# Patient Record
Sex: Female | Born: 1940 | Race: White | Hispanic: No | Marital: Single | Smoking: Current every day smoker
Health system: Southern US, Community
[De-identification: ages and names within clinical notes are randomized; demographics above are authoritative.]

## PROBLEM LIST (undated history)

## (undated) DIAGNOSIS — C801 Malignant (primary) neoplasm, unspecified: Secondary | ICD-10-CM

## (undated) DIAGNOSIS — I2699 Other pulmonary embolism without acute cor pulmonale: Secondary | ICD-10-CM

## (undated) DIAGNOSIS — R011 Cardiac murmur, unspecified: Secondary | ICD-10-CM

## (undated) DIAGNOSIS — S72002A Fracture of unspecified part of neck of left femur, initial encounter for closed fracture: Secondary | ICD-10-CM

## (undated) DIAGNOSIS — M47816 Spondylosis without myelopathy or radiculopathy, lumbar region: Secondary | ICD-10-CM

## (undated) DIAGNOSIS — I639 Cerebral infarction, unspecified: Secondary | ICD-10-CM

## (undated) DIAGNOSIS — R0602 Shortness of breath: Secondary | ICD-10-CM

## (undated) DIAGNOSIS — I10 Essential (primary) hypertension: Secondary | ICD-10-CM

## (undated) DIAGNOSIS — Z9221 Personal history of antineoplastic chemotherapy: Secondary | ICD-10-CM

## (undated) DIAGNOSIS — C3490 Malignant neoplasm of unspecified part of unspecified bronchus or lung: Secondary | ICD-10-CM

## (undated) DIAGNOSIS — Z923 Personal history of irradiation: Secondary | ICD-10-CM

## (undated) DIAGNOSIS — C4490 Unspecified malignant neoplasm of skin, unspecified: Secondary | ICD-10-CM

## (undated) DIAGNOSIS — J449 Chronic obstructive pulmonary disease, unspecified: Secondary | ICD-10-CM

## (undated) DIAGNOSIS — R52 Pain, unspecified: Secondary | ICD-10-CM

## (undated) DIAGNOSIS — D649 Anemia, unspecified: Secondary | ICD-10-CM

## (undated) DIAGNOSIS — M419 Scoliosis, unspecified: Secondary | ICD-10-CM

## (undated) HISTORY — DX: Scoliosis, unspecified: M41.9

## (undated) HISTORY — DX: Shortness of breath: R06.02

## (undated) HISTORY — DX: Pain, unspecified: R52

## (undated) HISTORY — DX: Cardiac murmur, unspecified: R01.1

## (undated) HISTORY — PX: PORTACATH PLACEMENT: SHX2246

## (undated) HISTORY — DX: Fracture of unspecified part of neck of left femur, initial encounter for closed fracture: S72.002A

## (undated) HISTORY — DX: Spondylosis without myelopathy or radiculopathy, lumbar region: M47.816

## (undated) HISTORY — DX: Other pulmonary embolism without acute cor pulmonale: I26.99

## (undated) HISTORY — DX: Malignant neoplasm of unspecified part of unspecified bronchus or lung: C34.90

## (undated) HISTORY — DX: Personal history of irradiation: Z92.3

## (undated) HISTORY — DX: Unspecified malignant neoplasm of skin, unspecified: C44.90

## (undated) HISTORY — DX: Personal history of antineoplastic chemotherapy: Z92.21

## (undated) HISTORY — DX: Chronic obstructive pulmonary disease, unspecified: J44.9

## (undated) HISTORY — DX: Cerebral infarction, unspecified: I63.9

## (undated) HISTORY — PX: HIP SURGERY: SHX245

---

## 1999-02-22 ENCOUNTER — Encounter (INDEPENDENT_AMBULATORY_CARE_PROVIDER_SITE_OTHER): Payer: Self-pay | Admitting: Specialist

## 1999-02-22 ENCOUNTER — Other Ambulatory Visit: Admission: RE | Admit: 1999-02-22 | Discharge: 1999-02-22 | Payer: Self-pay | Admitting: Otolaryngology

## 2001-12-14 ENCOUNTER — Emergency Department (HOSPITAL_COMMUNITY): Admission: EM | Admit: 2001-12-14 | Discharge: 2001-12-14 | Payer: Self-pay | Admitting: Emergency Medicine

## 2001-12-14 ENCOUNTER — Encounter: Payer: Self-pay | Admitting: Emergency Medicine

## 2002-01-21 ENCOUNTER — Encounter: Payer: Self-pay | Admitting: *Deleted

## 2002-01-21 ENCOUNTER — Ambulatory Visit (HOSPITAL_COMMUNITY): Admission: RE | Admit: 2002-01-21 | Discharge: 2002-01-21 | Payer: Self-pay | Admitting: *Deleted

## 2002-01-26 ENCOUNTER — Encounter: Payer: Self-pay | Admitting: *Deleted

## 2002-01-26 ENCOUNTER — Ambulatory Visit (HOSPITAL_COMMUNITY): Admission: RE | Admit: 2002-01-26 | Discharge: 2002-01-26 | Payer: Self-pay | Admitting: *Deleted

## 2002-07-11 ENCOUNTER — Encounter: Payer: Self-pay | Admitting: *Deleted

## 2002-07-11 ENCOUNTER — Ambulatory Visit (HOSPITAL_COMMUNITY): Admission: RE | Admit: 2002-07-11 | Discharge: 2002-07-11 | Payer: Self-pay | Admitting: *Deleted

## 2002-10-26 ENCOUNTER — Ambulatory Visit (HOSPITAL_COMMUNITY): Admission: RE | Admit: 2002-10-26 | Discharge: 2002-10-26 | Payer: Self-pay | Admitting: *Deleted

## 2002-10-26 ENCOUNTER — Encounter: Payer: Self-pay | Admitting: *Deleted

## 2004-03-23 ENCOUNTER — Emergency Department (HOSPITAL_COMMUNITY): Admission: EM | Admit: 2004-03-23 | Discharge: 2004-03-24 | Payer: Self-pay | Admitting: Emergency Medicine

## 2005-05-18 ENCOUNTER — Inpatient Hospital Stay (HOSPITAL_COMMUNITY): Admission: EM | Admit: 2005-05-18 | Discharge: 2005-05-23 | Payer: Self-pay | Admitting: Emergency Medicine

## 2005-05-18 ENCOUNTER — Ambulatory Visit: Payer: Self-pay | Admitting: Pulmonary Disease

## 2005-05-22 ENCOUNTER — Encounter (INDEPENDENT_AMBULATORY_CARE_PROVIDER_SITE_OTHER): Payer: Self-pay | Admitting: *Deleted

## 2005-06-04 ENCOUNTER — Ambulatory Visit: Payer: Self-pay | Admitting: Pulmonary Disease

## 2005-07-07 DIAGNOSIS — Z923 Personal history of irradiation: Secondary | ICD-10-CM

## 2005-07-07 HISTORY — DX: Personal history of irradiation: Z92.3

## 2005-07-30 ENCOUNTER — Ambulatory Visit: Payer: Self-pay | Admitting: Pulmonary Disease

## 2005-08-01 ENCOUNTER — Ambulatory Visit: Payer: Self-pay | Admitting: Cardiology

## 2005-08-26 ENCOUNTER — Ambulatory Visit (HOSPITAL_COMMUNITY): Admission: RE | Admit: 2005-08-26 | Discharge: 2005-08-26 | Payer: Self-pay | Admitting: Pulmonary Disease

## 2005-09-18 ENCOUNTER — Encounter (INDEPENDENT_AMBULATORY_CARE_PROVIDER_SITE_OTHER): Payer: Self-pay | Admitting: *Deleted

## 2005-09-18 ENCOUNTER — Ambulatory Visit (HOSPITAL_COMMUNITY): Admission: RE | Admit: 2005-09-18 | Discharge: 2005-09-18 | Payer: Self-pay | Admitting: Surgery

## 2005-09-18 DIAGNOSIS — C3490 Malignant neoplasm of unspecified part of unspecified bronchus or lung: Secondary | ICD-10-CM

## 2005-09-18 HISTORY — DX: Malignant neoplasm of unspecified part of unspecified bronchus or lung: C34.90

## 2005-09-30 ENCOUNTER — Ambulatory Visit: Payer: Self-pay | Admitting: Internal Medicine

## 2005-10-06 ENCOUNTER — Ambulatory Visit (HOSPITAL_COMMUNITY): Admission: RE | Admit: 2005-10-06 | Discharge: 2005-10-06 | Payer: Self-pay | Admitting: Internal Medicine

## 2005-10-06 ENCOUNTER — Ambulatory Visit: Admission: RE | Admit: 2005-10-06 | Discharge: 2006-01-04 | Payer: Self-pay | Admitting: Radiation Oncology

## 2005-10-08 LAB — COMPREHENSIVE METABOLIC PANEL
AST: 22 U/L (ref 0–37)
Albumin: 4.1 g/dL (ref 3.5–5.2)
Alkaline Phosphatase: 92 U/L (ref 39–117)
Potassium: 4.6 mEq/L (ref 3.5–5.3)
Sodium: 136 mEq/L (ref 135–145)
Total Protein: 7.1 g/dL (ref 6.0–8.3)

## 2005-10-08 LAB — CBC WITH DIFFERENTIAL/PLATELET
EOS%: 2.2 % (ref 0.0–7.0)
Eosinophils Absolute: 0.1 10*3/uL (ref 0.0–0.5)
MCH: 30.1 pg (ref 26.0–34.0)
MCV: 87.9 fL (ref 81.0–101.0)
MONO%: 9.5 % (ref 0.0–13.0)
NEUT#: 3.6 10*3/uL (ref 1.5–6.5)
RBC: 4.88 10*6/uL (ref 3.70–5.32)
RDW: 13.9 % (ref 11.3–14.5)

## 2005-10-22 LAB — CBC WITH DIFFERENTIAL/PLATELET
EOS%: 1.3 % (ref 0.0–7.0)
MCH: 30.4 pg (ref 26.0–34.0)
MCV: 88.1 fL (ref 81.0–101.0)
MONO%: 7.1 % (ref 0.0–13.0)
RBC: 4.88 10*6/uL (ref 3.70–5.32)
RDW: 13.8 % (ref 11.3–14.5)

## 2005-10-22 LAB — COMPREHENSIVE METABOLIC PANEL
AST: 22 U/L (ref 0–37)
Albumin: 4.3 g/dL (ref 3.5–5.2)
Alkaline Phosphatase: 94 U/L (ref 39–117)
BUN: 10 mg/dL (ref 6–23)
Potassium: 5.1 mEq/L (ref 3.5–5.3)
Sodium: 139 mEq/L (ref 135–145)
Total Protein: 7.1 g/dL (ref 6.0–8.3)

## 2005-11-17 ENCOUNTER — Ambulatory Visit: Payer: Self-pay | Admitting: Internal Medicine

## 2006-01-26 ENCOUNTER — Ambulatory Visit: Payer: Self-pay | Admitting: Internal Medicine

## 2006-01-29 LAB — COMPREHENSIVE METABOLIC PANEL
Albumin: 4.2 g/dL (ref 3.5–5.2)
Alkaline Phosphatase: 83 U/L (ref 39–117)
CO2: 29 mEq/L (ref 19–32)
Calcium: 9.9 mg/dL (ref 8.4–10.5)
Chloride: 100 mEq/L (ref 96–112)
Glucose, Bld: 102 mg/dL — ABNORMAL HIGH (ref 70–99)
Potassium: 4.8 mEq/L (ref 3.5–5.3)
Sodium: 137 mEq/L (ref 135–145)
Total Protein: 6.7 g/dL (ref 6.0–8.3)

## 2006-01-29 LAB — CBC WITH DIFFERENTIAL/PLATELET
Basophils Absolute: 0 10*3/uL (ref 0.0–0.1)
Eosinophils Absolute: 0.2 10*3/uL (ref 0.0–0.5)
HGB: 14.4 g/dL (ref 11.6–15.9)
NEUT#: 3.8 10*3/uL (ref 1.5–6.5)
RDW: 13.5 % (ref 11.3–14.5)
lymph#: 1.4 10*3/uL (ref 0.9–3.3)

## 2006-04-10 ENCOUNTER — Ambulatory Visit: Payer: Self-pay | Admitting: Internal Medicine

## 2006-04-28 LAB — COMPREHENSIVE METABOLIC PANEL
Albumin: 4.1 g/dL (ref 3.5–5.2)
Alkaline Phosphatase: 94 U/L (ref 39–117)
BUN: 8 mg/dL (ref 6–23)
Calcium: 9.8 mg/dL (ref 8.4–10.5)
Chloride: 97 mEq/L (ref 96–112)
Creatinine, Ser: 0.71 mg/dL (ref 0.40–1.20)
Glucose, Bld: 98 mg/dL (ref 70–99)
Potassium: 4.4 mEq/L (ref 3.5–5.3)

## 2006-04-28 LAB — CBC WITH DIFFERENTIAL/PLATELET
Basophils Absolute: 0 10*3/uL (ref 0.0–0.1)
Eosinophils Absolute: 0.2 10*3/uL (ref 0.0–0.5)
HCT: 41.3 % (ref 34.8–46.6)
HGB: 14.3 g/dL (ref 11.6–15.9)
MCV: 90.1 fL (ref 81.0–101.0)
MONO%: 6.8 % (ref 0.0–13.0)
NEUT#: 4.2 10*3/uL (ref 1.5–6.5)
NEUT%: 65.4 % (ref 39.6–76.8)
RDW: 13.5 % (ref 11.3–14.5)

## 2006-05-07 LAB — COMPREHENSIVE METABOLIC PANEL
Albumin: 4.3 g/dL (ref 3.5–5.2)
BUN: 10 mg/dL (ref 6–23)
Calcium: 9.6 mg/dL (ref 8.4–10.5)
Chloride: 100 mEq/L (ref 96–112)
Glucose, Bld: 79 mg/dL (ref 70–99)
Potassium: 3.9 mEq/L (ref 3.5–5.3)
Sodium: 135 mEq/L (ref 135–145)
Total Protein: 6.8 g/dL (ref 6.0–8.3)

## 2006-05-07 LAB — CBC WITH DIFFERENTIAL/PLATELET
Basophils Absolute: 0.1 10*3/uL (ref 0.0–0.1)
EOS%: 3.3 % (ref 0.0–7.0)
Eosinophils Absolute: 0.2 10*3/uL (ref 0.0–0.5)
HGB: 13.7 g/dL (ref 11.6–15.9)
MCH: 30.7 pg (ref 26.0–34.0)
MONO%: 8.8 % (ref 0.0–13.0)
NEUT#: 4.7 10*3/uL (ref 1.5–6.5)
RBC: 4.48 10*6/uL (ref 3.70–5.32)
RDW: 11.7 % (ref 11.3–14.5)
lymph#: 1.5 10*3/uL (ref 0.9–3.3)

## 2006-05-14 LAB — CBC WITH DIFFERENTIAL/PLATELET
BASO%: 0.1 % (ref 0.0–2.0)
EOS%: 1.9 % (ref 0.0–7.0)
LYMPH%: 7 % — ABNORMAL LOW (ref 14.0–48.0)
MCH: 30.5 pg (ref 26.0–34.0)
MCHC: 33.8 g/dL (ref 32.0–36.0)
MONO#: 0.5 10*3/uL (ref 0.1–0.9)
MONO%: 2 % (ref 0.0–13.0)
NEUT%: 89 % — ABNORMAL HIGH (ref 39.6–76.8)
Platelets: 221 10*3/uL (ref 145–400)
RBC: 4.41 10*6/uL (ref 3.70–5.32)
WBC: 24.2 10*3/uL — ABNORMAL HIGH (ref 3.9–10.0)

## 2006-05-14 LAB — COMPREHENSIVE METABOLIC PANEL
ALT: 26 U/L (ref 0–35)
AST: 28 U/L (ref 0–37)
Alkaline Phosphatase: 126 U/L — ABNORMAL HIGH (ref 39–117)
CO2: 37 mEq/L — ABNORMAL HIGH (ref 19–32)
Creatinine, Ser: 0.67 mg/dL (ref 0.40–1.20)
Sodium: 134 mEq/L — ABNORMAL LOW (ref 135–145)
Total Bilirubin: 0.4 mg/dL (ref 0.3–1.2)
Total Protein: 6.2 g/dL (ref 6.0–8.3)

## 2006-05-21 LAB — CBC WITH DIFFERENTIAL/PLATELET
BASO%: 0.2 % (ref 0.0–2.0)
Basophils Absolute: 0 10*3/uL (ref 0.0–0.1)
EOS%: 1.7 % (ref 0.0–7.0)
Eosinophils Absolute: 0.3 10*3/uL (ref 0.0–0.5)
HCT: 37.3 % (ref 34.8–46.6)
HGB: 12.9 g/dL (ref 11.6–15.9)
LYMPH%: 10.9 % — ABNORMAL LOW (ref 14.0–48.0)
MCH: 31.1 pg (ref 26.0–34.0)
MCHC: 34.6 g/dL (ref 32.0–36.0)
MCV: 90 fL (ref 81.0–101.0)
MONO#: 0.8 10*3/uL (ref 0.1–0.9)
MONO%: 5.3 % (ref 0.0–13.0)
NEUT#: 12.2 10*3/uL — ABNORMAL HIGH (ref 1.5–6.5)
NEUT%: 81.9 % — ABNORMAL HIGH (ref 39.6–76.8)
Platelets: 297 10*3/uL (ref 145–400)
RBC: 4.15 10*6/uL (ref 3.70–5.32)
RDW: 13.8 % (ref 11.3–14.5)
WBC: 14.9 10*3/uL — ABNORMAL HIGH (ref 3.9–10.0)
lymph#: 1.6 10*3/uL (ref 0.9–3.3)

## 2006-05-21 LAB — COMPREHENSIVE METABOLIC PANEL
ALT: 22 U/L (ref 0–35)
Alkaline Phosphatase: 113 U/L (ref 39–117)
CO2: 30 mEq/L (ref 19–32)
Creatinine, Ser: 0.63 mg/dL (ref 0.40–1.20)
Sodium: 138 mEq/L (ref 135–145)
Total Bilirubin: 0.3 mg/dL (ref 0.3–1.2)
Total Protein: 6.9 g/dL (ref 6.0–8.3)

## 2006-05-27 ENCOUNTER — Ambulatory Visit: Payer: Self-pay | Admitting: Internal Medicine

## 2006-05-27 LAB — CBC WITH DIFFERENTIAL/PLATELET
Eosinophils Absolute: 0.1 10*3/uL (ref 0.0–0.5)
HCT: 36.6 % (ref 34.8–46.6)
LYMPH%: 17.1 % (ref 14.0–48.0)
MCHC: 34.4 g/dL (ref 32.0–36.0)
MCV: 89.7 fL (ref 81.0–101.0)
MONO#: 0.8 10*3/uL (ref 0.1–0.9)
MONO%: 10.7 % (ref 0.0–13.0)
NEUT#: 5.3 10*3/uL (ref 1.5–6.5)
NEUT%: 68.7 % (ref 39.6–76.8)
Platelets: 532 10*3/uL — ABNORMAL HIGH (ref 145–400)
WBC: 7.6 10*3/uL (ref 3.9–10.0)

## 2006-05-27 LAB — COMPREHENSIVE METABOLIC PANEL
Alkaline Phosphatase: 87 U/L (ref 39–117)
BUN: 11 mg/dL (ref 6–23)
CO2: 29 mEq/L (ref 19–32)
Creatinine, Ser: 0.49 mg/dL (ref 0.40–1.20)
Glucose, Bld: 81 mg/dL (ref 70–99)
Total Bilirubin: 0.5 mg/dL (ref 0.3–1.2)

## 2006-06-04 LAB — COMPREHENSIVE METABOLIC PANEL
Albumin: 3.9 g/dL (ref 3.5–5.2)
Alkaline Phosphatase: 130 U/L — ABNORMAL HIGH (ref 39–117)
BUN: 8 mg/dL (ref 6–23)
CO2: 29 mEq/L (ref 19–32)
Chloride: 101 mEq/L (ref 96–112)
Creatinine, Ser: 0.45 mg/dL (ref 0.40–1.20)
Glucose, Bld: 102 mg/dL — ABNORMAL HIGH (ref 70–99)
Sodium: 138 mEq/L (ref 135–145)
Total Bilirubin: 0.3 mg/dL (ref 0.3–1.2)

## 2006-06-04 LAB — CBC WITH DIFFERENTIAL/PLATELET
BASO%: 0.4 % (ref 0.0–2.0)
HCT: 35 % (ref 34.8–46.6)
MCHC: 34.2 g/dL (ref 32.0–36.0)
MONO#: 1.2 10*3/uL — ABNORMAL HIGH (ref 0.1–0.9)
RBC: 3.87 10*6/uL (ref 3.70–5.32)
RDW: 14.3 % (ref 11.3–14.5)
WBC: 18.4 10*3/uL — ABNORMAL HIGH (ref 3.9–10.0)
lymph#: 1.6 10*3/uL (ref 0.9–3.3)

## 2006-06-11 LAB — CBC WITH DIFFERENTIAL/PLATELET
Basophils Absolute: 0 10*3/uL (ref 0.0–0.1)
EOS%: 1.3 % (ref 0.0–7.0)
HCT: 36.5 % (ref 34.8–46.6)
HGB: 12.5 g/dL (ref 11.6–15.9)
MCH: 31 pg (ref 26.0–34.0)
MCHC: 34.2 g/dL (ref 32.0–36.0)
MCV: 90.6 fL (ref 81.0–101.0)
MONO%: 4.7 % (ref 0.0–13.0)
NEUT%: 81.4 % — ABNORMAL HIGH (ref 39.6–76.8)

## 2006-06-11 LAB — COMPREHENSIVE METABOLIC PANEL
Alkaline Phosphatase: 123 U/L — ABNORMAL HIGH (ref 39–117)
Creatinine, Ser: 0.45 mg/dL (ref 0.40–1.20)
Glucose, Bld: 104 mg/dL — ABNORMAL HIGH (ref 70–99)
Sodium: 137 mEq/L (ref 135–145)
Total Bilirubin: 0.4 mg/dL (ref 0.3–1.2)
Total Protein: 6.7 g/dL (ref 6.0–8.3)

## 2006-06-17 ENCOUNTER — Ambulatory Visit (HOSPITAL_COMMUNITY): Admission: RE | Admit: 2006-06-17 | Discharge: 2006-06-17 | Payer: Self-pay | Admitting: Internal Medicine

## 2006-06-17 LAB — COMPREHENSIVE METABOLIC PANEL
Alkaline Phosphatase: 108 U/L (ref 39–117)
BUN: 9 mg/dL (ref 6–23)
CO2: 28 mEq/L (ref 19–32)
Creatinine, Ser: 0.39 mg/dL — ABNORMAL LOW (ref 0.40–1.20)
Glucose, Bld: 146 mg/dL — ABNORMAL HIGH (ref 70–99)
Total Bilirubin: 0.8 mg/dL (ref 0.3–1.2)

## 2006-06-17 LAB — CBC WITH DIFFERENTIAL/PLATELET
Basophils Absolute: 0 10*3/uL (ref 0.0–0.1)
Eosinophils Absolute: 0.1 10*3/uL (ref 0.0–0.5)
HCT: 35.7 % (ref 34.8–46.6)
HGB: 12.1 g/dL (ref 11.6–15.9)
LYMPH%: 11.6 % — ABNORMAL LOW (ref 14.0–48.0)
MCV: 91 fL (ref 81.0–101.0)
MONO%: 8.2 % (ref 0.0–13.0)
NEUT#: 7.4 10*3/uL — ABNORMAL HIGH (ref 1.5–6.5)
NEUT%: 78.7 % — ABNORMAL HIGH (ref 39.6–76.8)
Platelets: 251 10*3/uL (ref 145–400)
RBC: 3.93 10*6/uL (ref 3.70–5.32)

## 2006-07-06 ENCOUNTER — Ambulatory Visit (HOSPITAL_COMMUNITY): Admission: RE | Admit: 2006-07-06 | Discharge: 2006-07-06 | Payer: Self-pay | Admitting: Internal Medicine

## 2006-07-09 LAB — CBC WITH DIFFERENTIAL/PLATELET
Basophils Absolute: 0.1 10*3/uL (ref 0.0–0.1)
EOS%: 1.1 % (ref 0.0–7.0)
HGB: 10.8 g/dL — ABNORMAL LOW (ref 11.6–15.9)
LYMPH%: 18.9 % (ref 14.0–48.0)
MCH: 31.8 pg (ref 26.0–34.0)
MCV: 92.8 fL (ref 81.0–101.0)
MONO%: 7.6 % (ref 0.0–13.0)
NEUT%: 71.7 % (ref 39.6–76.8)
Platelets: 340 10*3/uL (ref 145–400)
RDW: 17.8 % — ABNORMAL HIGH (ref 11.3–14.5)

## 2006-07-09 LAB — COMPREHENSIVE METABOLIC PANEL
BUN: 9 mg/dL (ref 6–23)
CO2: 30 mEq/L (ref 19–32)
Creatinine, Ser: 0.45 mg/dL (ref 0.40–1.20)
Glucose, Bld: 166 mg/dL — ABNORMAL HIGH (ref 70–99)
Total Bilirubin: 0.4 mg/dL (ref 0.3–1.2)
Total Protein: 6.7 g/dL (ref 6.0–8.3)

## 2006-07-14 ENCOUNTER — Ambulatory Visit: Payer: Self-pay | Admitting: Internal Medicine

## 2006-07-17 LAB — CBC WITH DIFFERENTIAL/PLATELET
Eosinophils Absolute: 0.2 10*3/uL (ref 0.0–0.5)
HCT: 34.5 % — ABNORMAL LOW (ref 34.8–46.6)
LYMPH%: 16.1 % (ref 14.0–48.0)
MONO#: 0.4 10*3/uL (ref 0.1–0.9)
NEUT#: 4.1 10*3/uL (ref 1.5–6.5)
Platelets: 363 10*3/uL (ref 145–400)
RBC: 3.76 10*6/uL (ref 3.70–5.32)
WBC: 5.6 10*3/uL (ref 3.9–10.0)

## 2006-07-17 LAB — COMPREHENSIVE METABOLIC PANEL
Albumin: 4 g/dL (ref 3.5–5.2)
CO2: 26 mEq/L (ref 19–32)
Calcium: 9.7 mg/dL (ref 8.4–10.5)
Glucose, Bld: 97 mg/dL (ref 70–99)
Sodium: 136 mEq/L (ref 135–145)
Total Bilirubin: 0.5 mg/dL (ref 0.3–1.2)
Total Protein: 6.8 g/dL (ref 6.0–8.3)

## 2006-07-23 LAB — CBC WITH DIFFERENTIAL/PLATELET
Eosinophils Absolute: 0.3 10*3/uL (ref 0.0–0.5)
MONO#: 0.9 10*3/uL (ref 0.1–0.9)
NEUT#: 11.2 10*3/uL — ABNORMAL HIGH (ref 1.5–6.5)
Platelets: 229 10*3/uL (ref 145–400)
RBC: 3.53 10*6/uL — ABNORMAL LOW (ref 3.70–5.32)
RDW: 17.3 % — ABNORMAL HIGH (ref 11.3–14.5)
WBC: 14.1 10*3/uL — ABNORMAL HIGH (ref 3.9–10.0)
lymph#: 1.5 10*3/uL (ref 0.9–3.3)

## 2006-07-23 LAB — COMPREHENSIVE METABOLIC PANEL
ALT: 25 U/L (ref 0–35)
Albumin: 4 g/dL (ref 3.5–5.2)
CO2: 31 mEq/L (ref 19–32)
Chloride: 100 mEq/L (ref 96–112)
Glucose, Bld: 125 mg/dL — ABNORMAL HIGH (ref 70–99)
Potassium: 4.6 mEq/L (ref 3.5–5.3)
Sodium: 138 mEq/L (ref 135–145)
Total Protein: 6.7 g/dL (ref 6.0–8.3)

## 2006-07-30 LAB — CBC WITH DIFFERENTIAL/PLATELET
BASO%: 0 % (ref 0.0–2.0)
EOS%: 1.6 % (ref 0.0–7.0)
LYMPH%: 10.6 % — ABNORMAL LOW (ref 14.0–48.0)
MCHC: 34.8 g/dL (ref 32.0–36.0)
MCV: 94.6 fL (ref 81.0–101.0)
MONO%: 4.6 % (ref 0.0–13.0)
Platelets: 192 10*3/uL (ref 145–400)
RBC: 3.62 10*6/uL — ABNORMAL LOW (ref 3.70–5.32)
RDW: 17.5 % — ABNORMAL HIGH (ref 11.3–14.5)

## 2006-07-30 LAB — COMPREHENSIVE METABOLIC PANEL
ALT: 18 U/L (ref 0–35)
AST: 19 U/L (ref 0–37)
Alkaline Phosphatase: 133 U/L — ABNORMAL HIGH (ref 39–117)
Potassium: 4.1 mEq/L (ref 3.5–5.3)
Sodium: 138 mEq/L (ref 135–145)
Total Bilirubin: 0.5 mg/dL (ref 0.3–1.2)
Total Protein: 7 g/dL (ref 6.0–8.3)

## 2006-08-06 LAB — COMPREHENSIVE METABOLIC PANEL
Alkaline Phosphatase: 98 U/L (ref 39–117)
BUN: 12 mg/dL (ref 6–23)
Glucose, Bld: 122 mg/dL — ABNORMAL HIGH (ref 70–99)
Sodium: 140 mEq/L (ref 135–145)
Total Bilirubin: 0.5 mg/dL (ref 0.3–1.2)
Total Protein: 6.6 g/dL (ref 6.0–8.3)

## 2006-08-06 LAB — CBC WITH DIFFERENTIAL/PLATELET
EOS%: 1.8 % (ref 0.0–7.0)
Eosinophils Absolute: 0.1 10*3/uL (ref 0.0–0.5)
LYMPH%: 16.5 % (ref 14.0–48.0)
MCH: 32 pg (ref 26.0–34.0)
MCHC: 34.5 g/dL (ref 32.0–36.0)
MCV: 92.9 fL (ref 81.0–101.0)
MONO%: 9.8 % (ref 0.0–13.0)
NEUT#: 4.6 10*3/uL (ref 1.5–6.5)
Platelets: 258 10*3/uL (ref 145–400)
RBC: 3.64 10*6/uL — ABNORMAL LOW (ref 3.70–5.32)

## 2006-08-13 LAB — COMPREHENSIVE METABOLIC PANEL
Alkaline Phosphatase: 126 U/L — ABNORMAL HIGH (ref 39–117)
BUN: 10 mg/dL (ref 6–23)
Creatinine, Ser: 0.49 mg/dL (ref 0.40–1.20)
Glucose, Bld: 144 mg/dL — ABNORMAL HIGH (ref 70–99)
Total Bilirubin: 0.4 mg/dL (ref 0.3–1.2)

## 2006-08-13 LAB — CBC WITH DIFFERENTIAL/PLATELET
Basophils Absolute: 0 10*3/uL (ref 0.0–0.1)
Eosinophils Absolute: 0.2 10*3/uL (ref 0.0–0.5)
HGB: 10.4 g/dL — ABNORMAL LOW (ref 11.6–15.9)
LYMPH%: 13.2 % — ABNORMAL LOW (ref 14.0–48.0)
MCV: 95.3 fL (ref 81.0–101.0)
MONO%: 9.6 % (ref 0.0–13.0)
NEUT#: 8.7 10*3/uL — ABNORMAL HIGH (ref 1.5–6.5)
Platelets: 143 10*3/uL — ABNORMAL LOW (ref 145–400)

## 2006-08-20 LAB — COMPREHENSIVE METABOLIC PANEL
Alkaline Phosphatase: 124 U/L — ABNORMAL HIGH (ref 39–117)
BUN: 10 mg/dL (ref 6–23)
CO2: 26 mEq/L (ref 19–32)
Glucose, Bld: 174 mg/dL — ABNORMAL HIGH (ref 70–99)
Sodium: 131 mEq/L — ABNORMAL LOW (ref 135–145)
Total Bilirubin: 0.6 mg/dL (ref 0.3–1.2)
Total Protein: 6.6 g/dL (ref 6.0–8.3)

## 2006-08-20 LAB — CBC WITH DIFFERENTIAL/PLATELET
Basophils Absolute: 0 10*3/uL (ref 0.0–0.1)
Eosinophils Absolute: 0.1 10*3/uL (ref 0.0–0.5)
HCT: 29.3 % — ABNORMAL LOW (ref 34.8–46.6)
HGB: 10.4 g/dL — ABNORMAL LOW (ref 11.6–15.9)
LYMPH%: 9.9 % — ABNORMAL LOW (ref 14.0–48.0)
MCV: 95.1 fL (ref 81.0–101.0)
MONO#: 0.8 10*3/uL (ref 0.1–0.9)
MONO%: 5.5 % (ref 0.0–13.0)
NEUT#: 12.3 10*3/uL — ABNORMAL HIGH (ref 1.5–6.5)
NEUT%: 83.9 % — ABNORMAL HIGH (ref 39.6–76.8)
Platelets: 201 10*3/uL (ref 145–400)
WBC: 14.7 10*3/uL — ABNORMAL HIGH (ref 3.9–10.0)

## 2006-08-24 ENCOUNTER — Ambulatory Visit: Payer: Self-pay | Admitting: Internal Medicine

## 2006-08-27 LAB — CBC WITH DIFFERENTIAL/PLATELET
Basophils Absolute: 0.1 10*3/uL (ref 0.0–0.1)
Eosinophils Absolute: 0.1 10*3/uL (ref 0.0–0.5)
HGB: 10.8 g/dL — ABNORMAL LOW (ref 11.6–15.9)
MCV: 94.4 fL (ref 81.0–101.0)
MONO#: 0.6 10*3/uL (ref 0.1–0.9)
NEUT#: 5 10*3/uL (ref 1.5–6.5)
Platelets: 239 10*3/uL (ref 145–400)
RBC: 3.2 10*6/uL — ABNORMAL LOW (ref 3.70–5.32)
RDW: 13.5 % (ref 11.3–14.5)
WBC: 6.8 10*3/uL (ref 3.9–10.0)

## 2006-08-27 LAB — COMPREHENSIVE METABOLIC PANEL
Albumin: 3.9 g/dL (ref 3.5–5.2)
BUN: 9 mg/dL (ref 6–23)
CO2: 23 mEq/L (ref 19–32)
Calcium: 9.5 mg/dL (ref 8.4–10.5)
Glucose, Bld: 100 mg/dL — ABNORMAL HIGH (ref 70–99)
Potassium: 4.2 mEq/L (ref 3.5–5.3)
Sodium: 136 mEq/L (ref 135–145)
Total Protein: 7.2 g/dL (ref 6.0–8.3)

## 2006-08-31 ENCOUNTER — Ambulatory Visit (HOSPITAL_COMMUNITY): Admission: RE | Admit: 2006-08-31 | Discharge: 2006-08-31 | Payer: Self-pay | Admitting: Internal Medicine

## 2006-09-17 LAB — COMPREHENSIVE METABOLIC PANEL
ALT: 12 U/L (ref 0–35)
CO2: 27 mEq/L (ref 19–32)
Calcium: 9.1 mg/dL (ref 8.4–10.5)
Chloride: 100 mEq/L (ref 96–112)
Glucose, Bld: 94 mg/dL (ref 70–99)
Sodium: 137 mEq/L (ref 135–145)
Total Bilirubin: 0.5 mg/dL (ref 0.3–1.2)
Total Protein: 6.8 g/dL (ref 6.0–8.3)

## 2006-09-17 LAB — CBC WITH DIFFERENTIAL/PLATELET
Basophils Absolute: 0 10*3/uL (ref 0.0–0.1)
Eosinophils Absolute: 0.1 10*3/uL (ref 0.0–0.5)
HGB: 10.6 g/dL — ABNORMAL LOW (ref 11.6–15.9)
MONO#: 0.6 10*3/uL (ref 0.1–0.9)
MONO%: 9.4 % (ref 0.0–13.0)
NEUT#: 4.3 10*3/uL (ref 1.5–6.5)
RBC: 3.12 10*6/uL — ABNORMAL LOW (ref 3.70–5.32)
RDW: 17.1 % — ABNORMAL HIGH (ref 11.3–14.5)
WBC: 6.3 10*3/uL (ref 3.9–10.0)
lymph#: 1.3 10*3/uL (ref 0.9–3.3)

## 2006-12-15 ENCOUNTER — Ambulatory Visit: Payer: Self-pay | Admitting: Internal Medicine

## 2006-12-17 LAB — CBC WITH DIFFERENTIAL/PLATELET
BASO%: 0.6 % (ref 0.0–2.0)
Eosinophils Absolute: 0.2 10*3/uL (ref 0.0–0.5)
HCT: 38.8 % (ref 34.8–46.6)
LYMPH%: 26.8 % (ref 14.0–48.0)
MCH: 32.6 pg (ref 26.0–34.0)
MCHC: 34.8 g/dL (ref 32.0–36.0)
MONO#: 0.4 10*3/uL (ref 0.1–0.9)
MONO%: 7.5 % (ref 0.0–13.0)
NEUT#: 3.6 10*3/uL (ref 1.5–6.5)
Platelets: 255 10*3/uL (ref 145–400)
RBC: 4.15 10*6/uL (ref 3.70–5.32)
RDW: 12.8 % (ref 11.3–14.5)
WBC: 5.9 10*3/uL (ref 3.9–10.0)
lymph#: 1.6 10*3/uL (ref 0.9–3.3)

## 2006-12-17 LAB — COMPREHENSIVE METABOLIC PANEL
ALT: 11 U/L (ref 0–35)
Albumin: 4.1 g/dL (ref 3.5–5.2)
CO2: 29 mEq/L (ref 19–32)
Calcium: 9.9 mg/dL (ref 8.4–10.5)
Chloride: 102 mEq/L (ref 96–112)
Glucose, Bld: 103 mg/dL — ABNORMAL HIGH (ref 70–99)
Potassium: 4.1 mEq/L (ref 3.5–5.3)
Sodium: 141 mEq/L (ref 135–145)
Total Bilirubin: 0.6 mg/dL (ref 0.3–1.2)
Total Protein: 6.8 g/dL (ref 6.0–8.3)

## 2006-12-21 ENCOUNTER — Ambulatory Visit (HOSPITAL_COMMUNITY): Admission: RE | Admit: 2006-12-21 | Discharge: 2006-12-21 | Payer: Self-pay | Admitting: Internal Medicine

## 2007-02-15 ENCOUNTER — Ambulatory Visit: Payer: Self-pay | Admitting: Internal Medicine

## 2007-02-17 LAB — COMPREHENSIVE METABOLIC PANEL
AST: 15 U/L (ref 0–37)
BUN: 9 mg/dL (ref 6–23)
Calcium: 10.1 mg/dL (ref 8.4–10.5)
Creatinine, Ser: 0.49 mg/dL (ref 0.40–1.20)
Glucose, Bld: 102 mg/dL — ABNORMAL HIGH (ref 70–99)
Potassium: 3.8 mEq/L (ref 3.5–5.3)
Sodium: 136 mEq/L (ref 135–145)

## 2007-02-17 LAB — CBC WITH DIFFERENTIAL/PLATELET
BASO%: 0.6 % (ref 0.0–2.0)
Eosinophils Absolute: 0.2 10*3/uL (ref 0.0–0.5)
LYMPH%: 30.4 % (ref 14.0–48.0)
MCHC: 35.9 g/dL (ref 32.0–36.0)
MONO#: 0.4 10*3/uL (ref 0.1–0.9)
MONO%: 7.9 % (ref 0.0–13.0)
NEUT#: 3 10*3/uL (ref 1.5–6.5)
Platelets: 255 10*3/uL (ref 145–400)
RBC: 4.19 10*6/uL (ref 3.70–5.32)
RDW: 13.5 % (ref 11.3–14.5)
WBC: 5.1 10*3/uL (ref 3.9–10.0)

## 2007-02-19 ENCOUNTER — Ambulatory Visit (HOSPITAL_COMMUNITY): Admission: RE | Admit: 2007-02-19 | Discharge: 2007-02-19 | Payer: Self-pay | Admitting: Internal Medicine

## 2007-06-15 ENCOUNTER — Ambulatory Visit: Payer: Self-pay | Admitting: Internal Medicine

## 2007-06-17 LAB — CBC WITH DIFFERENTIAL/PLATELET
Basophils Absolute: 0 10*3/uL (ref 0.0–0.1)
Eosinophils Absolute: 0.3 10*3/uL (ref 0.0–0.5)
HCT: 38.8 % (ref 34.8–46.6)
HGB: 13.2 g/dL (ref 11.6–15.9)
MCH: 31.3 pg (ref 26.0–34.0)
MONO#: 0.4 10*3/uL (ref 0.1–0.9)
NEUT#: 3.3 10*3/uL (ref 1.5–6.5)
NEUT%: 58.6 % (ref 39.6–76.8)
WBC: 5.6 10*3/uL (ref 3.9–10.0)
lymph#: 1.6 10*3/uL (ref 0.9–3.3)

## 2007-06-17 LAB — COMPREHENSIVE METABOLIC PANEL
ALT: 12 U/L (ref 0–35)
AST: 17 U/L (ref 0–37)
Albumin: 3.9 g/dL (ref 3.5–5.2)
Alkaline Phosphatase: 73 U/L (ref 39–117)
Calcium: 9.8 mg/dL (ref 8.4–10.5)
Chloride: 100 mEq/L (ref 96–112)
Potassium: 4.3 mEq/L (ref 3.5–5.3)
Sodium: 137 mEq/L (ref 135–145)
Total Protein: 6.7 g/dL (ref 6.0–8.3)

## 2007-06-21 ENCOUNTER — Ambulatory Visit (HOSPITAL_COMMUNITY): Admission: RE | Admit: 2007-06-21 | Discharge: 2007-06-21 | Payer: Self-pay | Admitting: Internal Medicine

## 2007-07-12 ENCOUNTER — Ambulatory Visit: Admission: RE | Admit: 2007-07-12 | Discharge: 2007-09-12 | Payer: Self-pay | Admitting: Radiation Oncology

## 2007-07-13 ENCOUNTER — Encounter: Payer: Self-pay | Admitting: Pulmonary Disease

## 2007-08-02 ENCOUNTER — Encounter: Payer: Self-pay | Admitting: Pulmonary Disease

## 2007-08-02 ENCOUNTER — Ambulatory Visit: Payer: Self-pay | Admitting: Internal Medicine

## 2007-08-02 LAB — CBC WITH DIFFERENTIAL/PLATELET
BASO%: 0.5 % (ref 0.0–2.0)
Basophils Absolute: 0 10*3/uL (ref 0.0–0.1)
EOS%: 3.2 % (ref 0.0–7.0)
MCH: 31.2 pg (ref 26.0–34.0)
MCHC: 34.7 g/dL (ref 32.0–36.0)
MCV: 89.9 fL (ref 81.0–101.0)
MONO%: 7.8 % (ref 0.0–13.0)
RBC: 4.6 10*6/uL (ref 3.70–5.32)
RDW: 13.5 % (ref 11.3–14.5)
lymph#: 2 10*3/uL (ref 0.9–3.3)

## 2007-08-02 LAB — COMPREHENSIVE METABOLIC PANEL
ALT: 11 U/L (ref 0–35)
AST: 17 U/L (ref 0–37)
Albumin: 4.3 g/dL (ref 3.5–5.2)
Alkaline Phosphatase: 76 U/L (ref 39–117)
BUN: 12 mg/dL (ref 6–23)
Potassium: 4.2 mEq/L (ref 3.5–5.3)

## 2007-10-25 ENCOUNTER — Ambulatory Visit: Payer: Self-pay | Admitting: Internal Medicine

## 2007-10-27 ENCOUNTER — Ambulatory Visit (HOSPITAL_COMMUNITY): Admission: RE | Admit: 2007-10-27 | Discharge: 2007-10-27 | Payer: Self-pay | Admitting: Internal Medicine

## 2007-10-27 LAB — COMPREHENSIVE METABOLIC PANEL
ALT: 16 U/L (ref 0–35)
AST: 20 U/L (ref 0–37)
Albumin: 3.7 g/dL (ref 3.5–5.2)
Alkaline Phosphatase: 71 U/L (ref 39–117)
BUN: 12 mg/dL (ref 6–23)
Creatinine, Ser: 0.48 mg/dL (ref 0.40–1.20)
Potassium: 4.2 mEq/L (ref 3.5–5.3)

## 2007-10-27 LAB — CBC WITH DIFFERENTIAL/PLATELET
BASO%: 0.7 % (ref 0.0–2.0)
Basophils Absolute: 0 10*3/uL (ref 0.0–0.1)
EOS%: 3.2 % (ref 0.0–7.0)
HGB: 14.4 g/dL (ref 11.6–15.9)
MCH: 31.8 pg (ref 26.0–34.0)
MCHC: 35.3 g/dL (ref 32.0–36.0)
MCV: 89.9 fL (ref 81.0–101.0)
MONO%: 6.9 % (ref 0.0–13.0)
RDW: 13.8 % (ref 11.3–14.5)
lymph#: 1 10*3/uL (ref 0.9–3.3)

## 2008-02-20 ENCOUNTER — Ambulatory Visit: Payer: Self-pay | Admitting: Internal Medicine

## 2008-02-25 ENCOUNTER — Ambulatory Visit (HOSPITAL_COMMUNITY): Admission: RE | Admit: 2008-02-25 | Discharge: 2008-02-25 | Payer: Self-pay | Admitting: Internal Medicine

## 2008-02-25 LAB — CBC WITH DIFFERENTIAL/PLATELET
BASO%: 0.6 % (ref 0.0–2.0)
Basophils Absolute: 0 10*3/uL (ref 0.0–0.1)
EOS%: 3.1 % (ref 0.0–7.0)
HGB: 14 g/dL (ref 11.6–15.9)
MCH: 31.3 pg (ref 26.0–34.0)
MCV: 90.3 fL (ref 81.0–101.0)
MONO%: 8 % (ref 0.0–13.0)
RBC: 4.46 10*6/uL (ref 3.70–5.32)
RDW: 13.8 % (ref 11.3–14.5)
lymph#: 1 10*3/uL (ref 0.9–3.3)

## 2008-02-25 LAB — COMPREHENSIVE METABOLIC PANEL
ALT: 15 U/L (ref 0–35)
AST: 21 U/L (ref 0–37)
Albumin: 3.6 g/dL (ref 3.5–5.2)
Alkaline Phosphatase: 64 U/L (ref 39–117)
BUN: 7 mg/dL (ref 6–23)
Calcium: 9.5 mg/dL (ref 8.4–10.5)
Chloride: 100 mEq/L (ref 96–112)
Potassium: 3.9 mEq/L (ref 3.5–5.3)
Sodium: 135 mEq/L (ref 135–145)
Total Protein: 6.6 g/dL (ref 6.0–8.3)

## 2008-05-30 ENCOUNTER — Ambulatory Visit: Payer: Self-pay | Admitting: Internal Medicine

## 2008-06-26 ENCOUNTER — Ambulatory Visit (HOSPITAL_COMMUNITY): Admission: RE | Admit: 2008-06-26 | Discharge: 2008-06-26 | Payer: Self-pay | Admitting: Internal Medicine

## 2008-06-26 LAB — COMPREHENSIVE METABOLIC PANEL WITH GFR
ALT: 15 U/L (ref 0–35)
AST: 22 U/L (ref 0–37)
Albumin: 3.9 g/dL (ref 3.5–5.2)
Alkaline Phosphatase: 75 U/L (ref 39–117)
BUN: 10 mg/dL (ref 6–23)
CO2: 32 meq/L (ref 19–32)
Calcium: 9.9 mg/dL (ref 8.4–10.5)
Chloride: 96 meq/L (ref 96–112)
Creatinine, Ser: 0.49 mg/dL (ref 0.40–1.20)
Glucose, Bld: 107 mg/dL — ABNORMAL HIGH (ref 70–99)
Potassium: 4.3 meq/L (ref 3.5–5.3)
Sodium: 136 meq/L (ref 135–145)
Total Bilirubin: 1 mg/dL (ref 0.3–1.2)
Total Protein: 7 g/dL (ref 6.0–8.3)

## 2008-06-26 LAB — CBC WITH DIFFERENTIAL/PLATELET
BASO%: 0.6 % (ref 0.0–2.0)
EOS%: 4.5 % (ref 0.0–7.0)
MCH: 31.4 pg (ref 26.0–34.0)
MCV: 90.7 fL (ref 81.0–101.0)
MONO%: 6.9 % (ref 0.0–13.0)
RBC: 4.71 10*6/uL (ref 3.70–5.32)
RDW: 13.5 % (ref 11.3–14.5)

## 2008-12-01 ENCOUNTER — Ambulatory Visit: Payer: Self-pay | Admitting: Internal Medicine

## 2008-12-06 ENCOUNTER — Ambulatory Visit (HOSPITAL_COMMUNITY): Admission: RE | Admit: 2008-12-06 | Discharge: 2008-12-06 | Payer: Self-pay | Admitting: Internal Medicine

## 2008-12-06 LAB — CBC WITH DIFFERENTIAL/PLATELET
EOS%: 2.7 % (ref 0.0–7.0)
Eosinophils Absolute: 0.2 10*3/uL (ref 0.0–0.5)
HGB: 14.4 g/dL (ref 11.6–15.9)
MCV: 89.7 fL (ref 79.5–101.0)
MONO%: 7.8 % (ref 0.0–14.0)
NEUT#: 4.2 10*3/uL (ref 1.5–6.5)
RBC: 4.61 10*6/uL (ref 3.70–5.45)
RDW: 13.8 % (ref 11.2–14.5)
lymph#: 1 10*3/uL (ref 0.9–3.3)

## 2008-12-06 LAB — COMPREHENSIVE METABOLIC PANEL
AST: 19 U/L (ref 0–37)
Albumin: 3.7 g/dL (ref 3.5–5.2)
Alkaline Phosphatase: 73 U/L (ref 39–117)
Calcium: 9.6 mg/dL (ref 8.4–10.5)
Chloride: 94 mEq/L — ABNORMAL LOW (ref 96–112)
Glucose, Bld: 98 mg/dL (ref 70–99)
Potassium: 3.9 mEq/L (ref 3.5–5.3)
Sodium: 134 mEq/L — ABNORMAL LOW (ref 135–145)
Total Protein: 6.7 g/dL (ref 6.0–8.3)

## 2009-06-11 ENCOUNTER — Ambulatory Visit: Payer: Self-pay | Admitting: Internal Medicine

## 2009-06-13 LAB — COMPREHENSIVE METABOLIC PANEL
Albumin: 3.9 g/dL (ref 3.5–5.2)
Alkaline Phosphatase: 82 U/L (ref 39–117)
BUN: 8 mg/dL (ref 6–23)
CO2: 29 mEq/L (ref 19–32)
Calcium: 9.7 mg/dL (ref 8.4–10.5)
Chloride: 95 mEq/L — ABNORMAL LOW (ref 96–112)
Glucose, Bld: 99 mg/dL (ref 70–99)
Potassium: 4.4 mEq/L (ref 3.5–5.3)
Sodium: 133 mEq/L — ABNORMAL LOW (ref 135–145)
Total Protein: 7 g/dL (ref 6.0–8.3)

## 2009-06-13 LAB — CBC WITH DIFFERENTIAL/PLATELET
Basophils Absolute: 0 10*3/uL (ref 0.0–0.1)
Eosinophils Absolute: 0.3 10*3/uL (ref 0.0–0.5)
HGB: 14.9 g/dL (ref 11.6–15.9)
MCV: 92.3 fL (ref 79.5–101.0)
MONO#: 0.5 10*3/uL (ref 0.1–0.9)
NEUT#: 4.6 10*3/uL (ref 1.5–6.5)
RBC: 4.73 10*6/uL (ref 3.70–5.45)
RDW: 13.6 % (ref 11.2–14.5)
WBC: 6.7 10*3/uL (ref 3.9–10.3)
lymph#: 1.3 10*3/uL (ref 0.9–3.3)

## 2009-06-19 ENCOUNTER — Ambulatory Visit (HOSPITAL_COMMUNITY): Admission: RE | Admit: 2009-06-19 | Discharge: 2009-06-19 | Payer: Self-pay | Admitting: Internal Medicine

## 2009-12-12 ENCOUNTER — Ambulatory Visit: Payer: Self-pay | Admitting: Internal Medicine

## 2009-12-14 ENCOUNTER — Ambulatory Visit (HOSPITAL_COMMUNITY): Admission: RE | Admit: 2009-12-14 | Discharge: 2009-12-14 | Payer: Self-pay | Admitting: Internal Medicine

## 2009-12-14 LAB — COMPREHENSIVE METABOLIC PANEL
ALT: 14 U/L (ref 0–35)
AST: 20 U/L (ref 0–37)
Albumin: 3.8 g/dL (ref 3.5–5.2)
Alkaline Phosphatase: 74 U/L (ref 39–117)
Glucose, Bld: 95 mg/dL (ref 70–99)
Potassium: 3.8 mEq/L (ref 3.5–5.3)
Sodium: 135 mEq/L (ref 135–145)
Total Bilirubin: 0.7 mg/dL (ref 0.3–1.2)
Total Protein: 6.8 g/dL (ref 6.0–8.3)

## 2009-12-14 LAB — CBC WITH DIFFERENTIAL/PLATELET
BASO%: 1 % (ref 0.0–2.0)
EOS%: 4.2 % (ref 0.0–7.0)
Eosinophils Absolute: 0.2 10*3/uL (ref 0.0–0.5)
MCHC: 33.4 g/dL (ref 31.5–36.0)
MCV: 90.7 fL (ref 79.5–101.0)
MONO%: 8.6 % (ref 0.0–14.0)
NEUT#: 3.5 10*3/uL (ref 1.5–6.5)
RBC: 4.74 10*6/uL (ref 3.70–5.45)
RDW: 13.5 % (ref 11.2–14.5)

## 2009-12-28 ENCOUNTER — Ambulatory Visit (HOSPITAL_COMMUNITY): Admission: RE | Admit: 2009-12-28 | Discharge: 2009-12-28 | Payer: Self-pay | Admitting: Internal Medicine

## 2010-01-15 ENCOUNTER — Ambulatory Visit: Admission: RE | Admit: 2010-01-15 | Discharge: 2010-03-18 | Payer: Self-pay | Admitting: Radiation Oncology

## 2010-03-26 ENCOUNTER — Ambulatory Visit: Payer: Self-pay | Admitting: Internal Medicine

## 2010-03-29 ENCOUNTER — Ambulatory Visit (HOSPITAL_COMMUNITY): Admission: RE | Admit: 2010-03-29 | Discharge: 2010-03-29 | Payer: Self-pay | Admitting: Internal Medicine

## 2010-03-29 LAB — CBC WITH DIFFERENTIAL/PLATELET
BASO%: 0.3 % (ref 0.0–2.0)
Basophils Absolute: 0 10*3/uL (ref 0.0–0.1)
EOS%: 5 % (ref 0.0–7.0)
Eosinophils Absolute: 0.3 10*3/uL (ref 0.0–0.5)
HCT: 41.8 % (ref 34.8–46.6)
HGB: 14.2 g/dL (ref 11.6–15.9)
LYMPH%: 21.8 % (ref 14.0–49.7)
MCH: 30.6 pg (ref 25.1–34.0)
MCHC: 34 g/dL (ref 31.5–36.0)
MCV: 89.9 fL (ref 79.5–101.0)
MONO#: 0.5 10*3/uL (ref 0.1–0.9)
MONO%: 9.9 % (ref 0.0–14.0)
NEUT#: 3.4 10*3/uL (ref 1.5–6.5)
NEUT%: 63 % (ref 38.4–76.8)
Platelets: 340 10*3/uL (ref 145–400)
RBC: 4.65 10*6/uL (ref 3.70–5.45)
RDW: 13.9 % (ref 11.2–14.5)
WBC: 5.4 10*3/uL (ref 3.9–10.3)
lymph#: 1.2 10*3/uL (ref 0.9–3.3)

## 2010-04-02 LAB — COMPREHENSIVE METABOLIC PANEL
ALT: 13 U/L (ref 0–35)
Albumin: 3.8 g/dL (ref 3.5–5.2)
Alkaline Phosphatase: 76 U/L (ref 39–117)
CO2: 32 mEq/L (ref 19–32)
Glucose, Bld: 100 mg/dL — ABNORMAL HIGH (ref 70–99)
Potassium: 4 mEq/L (ref 3.5–5.3)
Sodium: 134 mEq/L — ABNORMAL LOW (ref 135–145)
Total Protein: 6.9 g/dL (ref 6.0–8.3)

## 2010-04-08 LAB — CBC WITH DIFFERENTIAL/PLATELET
BASO%: 0.6 % (ref 0.0–2.0)
EOS%: 3.6 % (ref 0.0–7.0)
MCH: 30.6 pg (ref 25.1–34.0)
MCHC: 34.3 g/dL (ref 31.5–36.0)
MONO%: 7.4 % (ref 0.0–14.0)
RBC: 4.64 10*6/uL (ref 3.70–5.45)
RDW: 13.8 % (ref 11.2–14.5)
lymph#: 0.9 10*3/uL (ref 0.9–3.3)

## 2010-04-08 LAB — COMPREHENSIVE METABOLIC PANEL
ALT: 11 U/L (ref 0–35)
AST: 16 U/L (ref 0–37)
Albumin: 4.3 g/dL (ref 3.5–5.2)
Alkaline Phosphatase: 75 U/L (ref 39–117)
Calcium: 10.5 mg/dL (ref 8.4–10.5)
Chloride: 98 mEq/L (ref 96–112)
Potassium: 4.9 mEq/L (ref 3.5–5.3)

## 2010-04-15 LAB — CBC WITH DIFFERENTIAL/PLATELET
BASO%: 0.2 % (ref 0.0–2.0)
Basophils Absolute: 0 10*3/uL (ref 0.0–0.1)
EOS%: 2.6 % (ref 0.0–7.0)
HCT: 38.6 % (ref 34.8–46.6)
MCH: 31.3 pg (ref 25.1–34.0)
MCHC: 34.4 g/dL (ref 31.5–36.0)
MCV: 91 fL (ref 79.5–101.0)
MONO%: 1 % (ref 0.0–14.0)
NEUT%: 89 % — ABNORMAL HIGH (ref 38.4–76.8)
RDW: 14 % (ref 11.2–14.5)
lymph#: 1.1 10*3/uL (ref 0.9–3.3)

## 2010-04-15 LAB — COMPREHENSIVE METABOLIC PANEL
ALT: 17 U/L (ref 0–35)
AST: 18 U/L (ref 0–37)
Alkaline Phosphatase: 104 U/L (ref 39–117)
BUN: 9 mg/dL (ref 6–23)
Calcium: 10.3 mg/dL (ref 8.4–10.5)
Chloride: 91 mEq/L — ABNORMAL LOW (ref 96–112)
Creatinine, Ser: 0.45 mg/dL (ref 0.40–1.20)

## 2010-04-22 LAB — COMPREHENSIVE METABOLIC PANEL
ALT: 18 U/L (ref 0–35)
AST: 19 U/L (ref 0–37)
BUN: 8 mg/dL (ref 6–23)
Calcium: 10 mg/dL (ref 8.4–10.5)
Chloride: 97 mEq/L (ref 96–112)
Creatinine, Ser: 0.51 mg/dL (ref 0.40–1.20)
Total Bilirubin: 0.6 mg/dL (ref 0.3–1.2)

## 2010-04-22 LAB — CBC WITH DIFFERENTIAL/PLATELET
BASO%: 0.2 % (ref 0.0–2.0)
Basophils Absolute: 0 10*3/uL (ref 0.0–0.1)
EOS%: 0.8 % (ref 0.0–7.0)
HCT: 35.7 % (ref 34.8–46.6)
HGB: 12.5 g/dL (ref 11.6–15.9)
LYMPH%: 6.4 % — ABNORMAL LOW (ref 14.0–49.7)
MCH: 31.5 pg (ref 25.1–34.0)
MCHC: 35.1 g/dL (ref 31.5–36.0)
MCV: 89.8 fL (ref 79.5–101.0)
MONO%: 4.1 % (ref 0.0–14.0)
NEUT%: 88.5 % — ABNORMAL HIGH (ref 38.4–76.8)
Platelets: 199 10*3/uL (ref 145–400)
lymph#: 1.1 10*3/uL (ref 0.9–3.3)

## 2010-04-25 ENCOUNTER — Ambulatory Visit: Payer: Self-pay | Admitting: Internal Medicine

## 2010-04-29 ENCOUNTER — Encounter: Payer: Self-pay | Admitting: Pulmonary Disease

## 2010-04-29 LAB — CBC WITH DIFFERENTIAL/PLATELET
BASO%: 0.6 % (ref 0.0–2.0)
Basophils Absolute: 0.1 10*3/uL (ref 0.0–0.1)
EOS%: 0.9 % (ref 0.0–7.0)
HGB: 12.5 g/dL (ref 11.6–15.9)
MCH: 30.5 pg (ref 25.1–34.0)
MCHC: 34 g/dL (ref 31.5–36.0)
MCV: 89.8 fL (ref 79.5–101.0)
MONO%: 7.4 % (ref 0.0–14.0)
NEUT%: 83.4 % — ABNORMAL HIGH (ref 38.4–76.8)
RDW: 14.2 % (ref 11.2–14.5)
lymph#: 0.9 10*3/uL (ref 0.9–3.3)

## 2010-04-29 LAB — COMPREHENSIVE METABOLIC PANEL
AST: 16 U/L (ref 0–37)
Alkaline Phosphatase: 95 U/L (ref 39–117)
BUN: 8 mg/dL (ref 6–23)
Glucose, Bld: 95 mg/dL (ref 70–99)
Sodium: 134 mEq/L — ABNORMAL LOW (ref 135–145)
Total Bilirubin: 0.4 mg/dL (ref 0.3–1.2)

## 2010-05-06 LAB — CBC WITH DIFFERENTIAL/PLATELET
Basophils Absolute: 0.1 10*3/uL (ref 0.0–0.1)
Eosinophils Absolute: 0.3 10*3/uL (ref 0.0–0.5)
HGB: 11.8 g/dL (ref 11.6–15.9)
LYMPH%: 14.9 % (ref 14.0–49.7)
MCH: 30.3 pg (ref 25.1–34.0)
MCV: 90.5 fL (ref 79.5–101.0)
MONO%: 15.3 % — ABNORMAL HIGH (ref 0.0–14.0)
NEUT#: 5.8 10*3/uL (ref 1.5–6.5)
NEUT%: 65.7 % (ref 38.4–76.8)
Platelets: 181 10*3/uL (ref 145–400)

## 2010-05-06 LAB — COMPREHENSIVE METABOLIC PANEL
Albumin: 3.9 g/dL (ref 3.5–5.2)
Alkaline Phosphatase: 108 U/L (ref 39–117)
BUN: 10 mg/dL (ref 6–23)
Creatinine, Ser: 0.47 mg/dL (ref 0.40–1.20)
Glucose, Bld: 97 mg/dL (ref 70–99)
Potassium: 4 mEq/L (ref 3.5–5.3)
Total Bilirubin: 0.7 mg/dL (ref 0.3–1.2)

## 2010-05-13 LAB — COMPREHENSIVE METABOLIC PANEL
Albumin: 3.8 g/dL (ref 3.5–5.2)
Alkaline Phosphatase: 101 U/L (ref 39–117)
BUN: 8 mg/dL (ref 6–23)
Calcium: 9.6 mg/dL (ref 8.4–10.5)
Chloride: 97 mEq/L (ref 96–112)
Glucose, Bld: 96 mg/dL (ref 70–99)
Potassium: 4.2 mEq/L (ref 3.5–5.3)

## 2010-05-13 LAB — CBC WITH DIFFERENTIAL/PLATELET
Basophils Absolute: 0 10*3/uL (ref 0.0–0.1)
Eosinophils Absolute: 0.2 10*3/uL (ref 0.0–0.5)
HGB: 11.5 g/dL — ABNORMAL LOW (ref 11.6–15.9)
MCV: 91 fL (ref 79.5–101.0)
MONO%: 5.4 % (ref 0.0–14.0)
NEUT#: 9.9 10*3/uL — ABNORMAL HIGH (ref 1.5–6.5)
RDW: 15 % — ABNORMAL HIGH (ref 11.2–14.5)

## 2010-05-15 ENCOUNTER — Ambulatory Visit (HOSPITAL_COMMUNITY): Admission: RE | Admit: 2010-05-15 | Discharge: 2010-05-15 | Payer: Self-pay | Admitting: Internal Medicine

## 2010-05-20 ENCOUNTER — Encounter: Payer: Self-pay | Admitting: Pulmonary Disease

## 2010-05-20 LAB — COMPREHENSIVE METABOLIC PANEL
Albumin: 3.5 g/dL (ref 3.5–5.2)
BUN: 7 mg/dL (ref 6–23)
Calcium: 9.8 mg/dL (ref 8.4–10.5)
Chloride: 98 mEq/L (ref 96–112)
Glucose, Bld: 95 mg/dL (ref 70–99)
Potassium: 4.3 mEq/L (ref 3.5–5.3)
Total Protein: 6.4 g/dL (ref 6.0–8.3)

## 2010-05-20 LAB — CBC WITH DIFFERENTIAL/PLATELET
Basophils Absolute: 0 10*3/uL (ref 0.0–0.1)
Eosinophils Absolute: 0.1 10*3/uL (ref 0.0–0.5)
HGB: 11.4 g/dL — ABNORMAL LOW (ref 11.6–15.9)
MONO#: 0.5 10*3/uL (ref 0.1–0.9)
NEUT#: 3.7 10*3/uL (ref 1.5–6.5)
RDW: 16.1 % — ABNORMAL HIGH (ref 11.2–14.5)
WBC: 5 10*3/uL (ref 3.9–10.3)
lymph#: 0.7 10*3/uL — ABNORMAL LOW (ref 0.9–3.3)

## 2010-05-27 ENCOUNTER — Ambulatory Visit: Payer: Self-pay | Admitting: Internal Medicine

## 2010-05-27 LAB — CBC WITH DIFFERENTIAL/PLATELET
Basophils Absolute: 0.1 10*3/uL (ref 0.0–0.1)
EOS%: 2.7 % (ref 0.0–7.0)
Eosinophils Absolute: 0.3 10*3/uL (ref 0.0–0.5)
HGB: 10.9 g/dL — ABNORMAL LOW (ref 11.6–15.9)
MCH: 32.4 pg (ref 25.1–34.0)
NEUT#: 8.3 10*3/uL — ABNORMAL HIGH (ref 1.5–6.5)
RBC: 3.37 10*6/uL — ABNORMAL LOW (ref 3.70–5.45)
RDW: 16.6 % — ABNORMAL HIGH (ref 11.2–14.5)
lymph#: 1.2 10*3/uL (ref 0.9–3.3)

## 2010-05-27 LAB — COMPREHENSIVE METABOLIC PANEL
ALT: 21 U/L (ref 0–35)
AST: 22 U/L (ref 0–37)
Albumin: 3.7 g/dL (ref 3.5–5.2)
BUN: 8 mg/dL (ref 6–23)
Calcium: 9.8 mg/dL (ref 8.4–10.5)
Chloride: 93 mEq/L — ABNORMAL LOW (ref 96–112)
Potassium: 3.3 mEq/L — ABNORMAL LOW (ref 3.5–5.3)
Sodium: 133 mEq/L — ABNORMAL LOW (ref 135–145)
Total Protein: 6.4 g/dL (ref 6.0–8.3)

## 2010-06-03 ENCOUNTER — Ambulatory Visit (HOSPITAL_COMMUNITY)
Admission: RE | Admit: 2010-06-03 | Discharge: 2010-06-03 | Payer: Self-pay | Source: Home / Self Care | Admitting: Internal Medicine

## 2010-06-03 LAB — CBC WITH DIFFERENTIAL/PLATELET
BASO%: 0 % (ref 0.0–2.0)
EOS%: 0.8 % (ref 0.0–7.0)
HGB: 11.4 g/dL — ABNORMAL LOW (ref 11.6–15.9)
MCH: 32.6 pg (ref 25.1–34.0)
MCHC: 34.8 g/dL (ref 31.5–36.0)
MCV: 93.5 fL (ref 79.5–101.0)
MONO%: 3.3 % (ref 0.0–14.0)
RBC: 3.5 10*6/uL — ABNORMAL LOW (ref 3.70–5.45)
RDW: 17.5 % — ABNORMAL HIGH (ref 11.2–14.5)
lymph#: 1 10*3/uL (ref 0.9–3.3)

## 2010-06-03 LAB — COMPREHENSIVE METABOLIC PANEL
ALT: 18 U/L (ref 0–35)
AST: 24 U/L (ref 0–37)
Albumin: 3.7 g/dL (ref 3.5–5.2)
Alkaline Phosphatase: 109 U/L (ref 39–117)
Calcium: 9.3 mg/dL (ref 8.4–10.5)
Chloride: 96 mEq/L (ref 96–112)
Potassium: 4.5 mEq/L (ref 3.5–5.3)
Sodium: 136 mEq/L (ref 135–145)

## 2010-06-10 ENCOUNTER — Encounter: Payer: Self-pay | Admitting: Pulmonary Disease

## 2010-06-10 LAB — COMPREHENSIVE METABOLIC PANEL
AST: 16 U/L (ref 0–37)
Albumin: 3.8 g/dL (ref 3.5–5.2)
Alkaline Phosphatase: 92 U/L (ref 39–117)
Glucose, Bld: 95 mg/dL (ref 70–99)
Potassium: 4.9 mEq/L (ref 3.5–5.3)
Sodium: 139 mEq/L (ref 135–145)
Total Bilirubin: 0.6 mg/dL (ref 0.3–1.2)
Total Protein: 6.6 g/dL (ref 6.0–8.3)

## 2010-06-10 LAB — CBC WITH DIFFERENTIAL/PLATELET
EOS%: 1.2 % (ref 0.0–7.0)
LYMPH%: 12 % — ABNORMAL LOW (ref 14.0–49.7)
MCH: 32.6 pg (ref 25.1–34.0)
MCHC: 34.6 g/dL (ref 31.5–36.0)
MCV: 94 fL (ref 79.5–101.0)
MONO%: 9.2 % (ref 0.0–14.0)
Platelets: 271 10*3/uL (ref 145–400)
RBC: 3.66 10*6/uL — ABNORMAL LOW (ref 3.70–5.45)
RDW: 19.2 % — ABNORMAL HIGH (ref 11.2–14.5)

## 2010-06-17 LAB — COMPREHENSIVE METABOLIC PANEL
AST: 24 U/L (ref 0–37)
Alkaline Phosphatase: 110 U/L (ref 39–117)
BUN: 8 mg/dL (ref 6–23)
Creatinine, Ser: 0.38 mg/dL — ABNORMAL LOW (ref 0.40–1.20)
Glucose, Bld: 95 mg/dL (ref 70–99)
Total Bilirubin: 0.6 mg/dL (ref 0.3–1.2)

## 2010-06-17 LAB — CBC WITH DIFFERENTIAL/PLATELET
Basophils Absolute: 0.1 10*3/uL (ref 0.0–0.1)
EOS%: 4.1 % (ref 0.0–7.0)
Eosinophils Absolute: 0.3 10*3/uL (ref 0.0–0.5)
HGB: 10.4 g/dL — ABNORMAL LOW (ref 11.6–15.9)
LYMPH%: 14.1 % (ref 14.0–49.7)
MCH: 33.6 pg (ref 25.1–34.0)
MCV: 95.1 fL (ref 79.5–101.0)
MONO%: 12.7 % (ref 0.0–14.0)
NEUT#: 5.1 10*3/uL (ref 1.5–6.5)
Platelets: 134 10*3/uL — ABNORMAL LOW (ref 145–400)
RDW: 18.4 % — ABNORMAL HIGH (ref 11.2–14.5)

## 2010-06-24 ENCOUNTER — Ambulatory Visit: Payer: Self-pay | Admitting: Internal Medicine

## 2010-06-24 LAB — CBC WITH DIFFERENTIAL/PLATELET
Basophils Absolute: 0 10*3/uL (ref 0.0–0.1)
EOS%: 1.6 % (ref 0.0–7.0)
Eosinophils Absolute: 0.2 10*3/uL (ref 0.0–0.5)
HCT: 33.5 % — ABNORMAL LOW (ref 34.8–46.6)
HGB: 11.1 g/dL — ABNORMAL LOW (ref 11.6–15.9)
LYMPH%: 9.1 % — ABNORMAL LOW (ref 14.0–49.7)
MCH: 32.4 pg (ref 25.1–34.0)
MCV: 97.6 fL (ref 79.5–101.0)
MONO%: 5.7 % (ref 0.0–14.0)
NEUT#: 10 10*3/uL — ABNORMAL HIGH (ref 1.5–6.5)
NEUT%: 83.5 % — ABNORMAL HIGH (ref 38.4–76.8)
Platelets: 194 10*3/uL (ref 145–400)
RDW: 19.6 % — ABNORMAL HIGH (ref 11.2–14.5)

## 2010-06-24 LAB — COMPREHENSIVE METABOLIC PANEL
Albumin: 4.2 g/dL (ref 3.5–5.2)
Alkaline Phosphatase: 115 U/L (ref 39–117)
BUN: 9 mg/dL (ref 6–23)
Creatinine, Ser: 0.74 mg/dL (ref 0.40–1.20)
Glucose, Bld: 124 mg/dL — ABNORMAL HIGH (ref 70–99)
Total Bilirubin: 0.5 mg/dL (ref 0.3–1.2)

## 2010-07-02 ENCOUNTER — Encounter: Payer: Self-pay | Admitting: Pulmonary Disease

## 2010-07-11 ENCOUNTER — Ambulatory Visit: Payer: Self-pay | Admitting: Internal Medicine

## 2010-07-11 LAB — CBC WITH DIFFERENTIAL/PLATELET
BASO%: 0.4 % (ref 0.0–2.0)
Basophils Absolute: 0.1 10*3/uL (ref 0.0–0.1)
EOS%: 1.4 % (ref 0.0–7.0)
Eosinophils Absolute: 0.2 10*3/uL (ref 0.0–0.5)
HCT: 27.9 % — ABNORMAL LOW (ref 34.8–46.6)
HGB: 9.6 g/dL — ABNORMAL LOW (ref 11.6–15.9)
LYMPH%: 8.7 % — ABNORMAL LOW (ref 14.0–49.7)
MCH: 34.3 pg — ABNORMAL HIGH (ref 25.1–34.0)
MCHC: 34.5 g/dL (ref 31.5–36.0)
MCV: 99.2 fL (ref 79.5–101.0)
MONO#: 1.1 10*3/uL — ABNORMAL HIGH (ref 0.1–0.9)
MONO%: 6.9 % (ref 0.0–14.0)
NEUT#: 13.1 10*3/uL — ABNORMAL HIGH (ref 1.5–6.5)
NEUT%: 82.6 % — ABNORMAL HIGH (ref 38.4–76.8)
Platelets: 158 10*3/uL (ref 145–400)
RBC: 2.81 10*6/uL — ABNORMAL LOW (ref 3.70–5.45)
RDW: 18 % — ABNORMAL HIGH (ref 11.2–14.5)
WBC: 15.9 10*3/uL — ABNORMAL HIGH (ref 3.9–10.3)
lymph#: 1.4 10*3/uL (ref 0.9–3.3)

## 2010-07-11 LAB — COMPREHENSIVE METABOLIC PANEL
ALT: 21 U/L (ref 0–35)
AST: 28 U/L (ref 0–37)
Albumin: 4 g/dL (ref 3.5–5.2)
Alkaline Phosphatase: 108 U/L (ref 39–117)
BUN: 5 mg/dL — ABNORMAL LOW (ref 6–23)
CO2: 28 mEq/L (ref 19–32)
Calcium: 9.5 mg/dL (ref 8.4–10.5)
Chloride: 94 mEq/L — ABNORMAL LOW (ref 96–112)
Creatinine, Ser: 0.48 mg/dL (ref 0.40–1.20)
Glucose, Bld: 103 mg/dL — ABNORMAL HIGH (ref 70–99)
Potassium: 3.6 mEq/L (ref 3.5–5.3)
Sodium: 134 mEq/L — ABNORMAL LOW (ref 135–145)
Total Bilirubin: 0.4 mg/dL (ref 0.3–1.2)
Total Protein: 6.4 g/dL (ref 6.0–8.3)

## 2010-07-15 LAB — COMPREHENSIVE METABOLIC PANEL
ALT: 23 U/L (ref 0–35)
AST: 24 U/L (ref 0–37)
Albumin: 4.4 g/dL (ref 3.5–5.2)
Alkaline Phosphatase: 118 U/L — ABNORMAL HIGH (ref 39–117)
BUN: 5 mg/dL — ABNORMAL LOW (ref 6–23)
CO2: 30 mEq/L (ref 19–32)
Calcium: 10.6 mg/dL — ABNORMAL HIGH (ref 8.4–10.5)
Chloride: 96 mEq/L (ref 96–112)
Creatinine, Ser: 0.49 mg/dL (ref 0.40–1.20)
Glucose, Bld: 103 mg/dL — ABNORMAL HIGH (ref 70–99)
Potassium: 4.5 mEq/L (ref 3.5–5.3)
Sodium: 138 mEq/L (ref 135–145)
Total Bilirubin: 0.5 mg/dL (ref 0.3–1.2)
Total Protein: 7.1 g/dL (ref 6.0–8.3)

## 2010-07-15 LAB — CBC WITH DIFFERENTIAL/PLATELET
BASO%: 0.1 % (ref 0.0–2.0)
Basophils Absolute: 0 10*3/uL (ref 0.0–0.1)
EOS%: 0.5 % (ref 0.0–7.0)
Eosinophils Absolute: 0.1 10*3/uL (ref 0.0–0.5)
HCT: 31.7 % — ABNORMAL LOW (ref 34.8–46.6)
HGB: 10.8 g/dL — ABNORMAL LOW (ref 11.6–15.9)
LYMPH%: 7.4 % — ABNORMAL LOW (ref 14.0–49.7)
MCH: 34.3 pg — ABNORMAL HIGH (ref 25.1–34.0)
MCHC: 34 g/dL (ref 31.5–36.0)
MCV: 101 fL (ref 79.5–101.0)
MONO#: 0.6 10*3/uL (ref 0.1–0.9)
MONO%: 3.9 % (ref 0.0–14.0)
NEUT#: 13.9 10*3/uL — ABNORMAL HIGH (ref 1.5–6.5)
NEUT%: 88.1 % — ABNORMAL HIGH (ref 38.4–76.8)
Platelets: 189 10*3/uL (ref 145–400)
RBC: 3.14 10*6/uL — ABNORMAL LOW (ref 3.70–5.45)
RDW: 19.1 % — ABNORMAL HIGH (ref 11.2–14.5)
WBC: 15.8 10*3/uL — ABNORMAL HIGH (ref 3.9–10.3)
lymph#: 1.2 10*3/uL (ref 0.9–3.3)

## 2010-07-22 LAB — COMPREHENSIVE METABOLIC PANEL
ALT: 10 U/L (ref 0–35)
AST: 16 U/L (ref 0–37)
Albumin: 3.9 g/dL (ref 3.5–5.2)
Alkaline Phosphatase: 97 U/L (ref 39–117)
BUN: 10 mg/dL (ref 6–23)
CO2: 34 mEq/L — ABNORMAL HIGH (ref 19–32)
Calcium: 9.8 mg/dL (ref 8.4–10.5)
Chloride: 95 mEq/L — ABNORMAL LOW (ref 96–112)
Creatinine, Ser: 0.37 mg/dL — ABNORMAL LOW (ref 0.40–1.20)
Glucose, Bld: 93 mg/dL (ref 70–99)
Potassium: 4.1 mEq/L (ref 3.5–5.3)
Sodium: 136 mEq/L (ref 135–145)
Total Bilirubin: 0.6 mg/dL (ref 0.3–1.2)
Total Protein: 6.7 g/dL (ref 6.0–8.3)

## 2010-07-22 LAB — CBC WITH DIFFERENTIAL/PLATELET
BASO%: 0.7 % (ref 0.0–2.0)
Basophils Absolute: 0 10*3/uL (ref 0.0–0.1)
EOS%: 0.9 % (ref 0.0–7.0)
Eosinophils Absolute: 0.1 10*3/uL (ref 0.0–0.5)
HCT: 30.4 % — ABNORMAL LOW (ref 34.8–46.6)
HGB: 10.3 g/dL — ABNORMAL LOW (ref 11.6–15.9)
LYMPH%: 12.1 % — ABNORMAL LOW (ref 14.0–49.7)
MCH: 34.5 pg — ABNORMAL HIGH (ref 25.1–34.0)
MCHC: 34 g/dL (ref 31.5–36.0)
MCV: 101.5 fL — ABNORMAL HIGH (ref 79.5–101.0)
MONO#: 0.7 10*3/uL (ref 0.1–0.9)
MONO%: 10.8 % (ref 0.0–14.0)
NEUT#: 5.2 10*3/uL (ref 1.5–6.5)
NEUT%: 75.5 % (ref 38.4–76.8)
Platelets: 257 10*3/uL (ref 145–400)
RBC: 2.99 10*6/uL — ABNORMAL LOW (ref 3.70–5.45)
RDW: 17.8 % — ABNORMAL HIGH (ref 11.2–14.5)
WBC: 6.9 10*3/uL (ref 3.9–10.3)
lymph#: 0.8 10*3/uL — ABNORMAL LOW (ref 0.9–3.3)

## 2010-07-28 ENCOUNTER — Encounter: Payer: Self-pay | Admitting: Internal Medicine

## 2010-07-29 LAB — CBC WITH DIFFERENTIAL/PLATELET
Basophils Absolute: 0.1 10*3/uL (ref 0.0–0.1)
EOS%: 3.7 % (ref 0.0–7.0)
HGB: 9.9 g/dL — ABNORMAL LOW (ref 11.6–15.9)
MCH: 34.5 pg — ABNORMAL HIGH (ref 25.1–34.0)
NEUT#: 5.7 10*3/uL (ref 1.5–6.5)
RDW: 16.1 % — ABNORMAL HIGH (ref 11.2–14.5)
lymph#: 1.1 10*3/uL (ref 0.9–3.3)

## 2010-07-30 LAB — COMPREHENSIVE METABOLIC PANEL
ALT: 12 U/L (ref 0–35)
AST: 16 U/L (ref 0–37)
Albumin: 3.9 g/dL (ref 3.5–5.2)
BUN: 6 mg/dL (ref 6–23)
Calcium: 10.1 mg/dL (ref 8.4–10.5)
Chloride: 96 mEq/L (ref 96–112)
Potassium: 4.3 mEq/L (ref 3.5–5.3)
Sodium: 137 mEq/L (ref 135–145)
Total Protein: 6.4 g/dL (ref 6.0–8.3)

## 2010-08-05 ENCOUNTER — Ambulatory Visit (HOSPITAL_COMMUNITY)
Admission: RE | Admit: 2010-08-05 | Discharge: 2010-08-05 | Payer: Self-pay | Source: Home / Self Care | Attending: Internal Medicine | Admitting: Internal Medicine

## 2010-08-06 NOTE — Letter (Signed)
Summary: [facility]  [facility]   Imported By: Sherian Rein 05/17/2010 09:37:53  _____________________________________________________________________  External Attachment:    Type:   Image     Comment:   External Document

## 2010-08-06 NOTE — Letter (Signed)
Summary: [facility]  [facility]   Imported By: Lennie Odor 05/31/2010 14:05:21  _____________________________________________________________________  External Attachment:    Type:   Image     Comment:   External Document

## 2010-08-08 NOTE — Letter (Signed)
Summary: [facility]  [facility]   Imported By: Lester Hallwood 06/19/2010 07:33:06  _____________________________________________________________________  External Attachment:    Type:   Image     Comment:   External Document

## 2010-08-08 NOTE — Letter (Signed)
Summary: [facility]  [facility]   Imported By: Lester Kwethluk 07/10/2010 09:04:57  _____________________________________________________________________  External Attachment:    Type:   Image     Comment:   External Document

## 2010-08-12 ENCOUNTER — Other Ambulatory Visit: Payer: Self-pay | Admitting: Internal Medicine

## 2010-08-12 ENCOUNTER — Encounter (HOSPITAL_BASED_OUTPATIENT_CLINIC_OR_DEPARTMENT_OTHER): Payer: No Typology Code available for payment source | Admitting: Internal Medicine

## 2010-08-12 DIAGNOSIS — C3410 Malignant neoplasm of upper lobe, unspecified bronchus or lung: Secondary | ICD-10-CM

## 2010-08-12 DIAGNOSIS — C3490 Malignant neoplasm of unspecified part of unspecified bronchus or lung: Secondary | ICD-10-CM

## 2010-08-12 DIAGNOSIS — Z5111 Encounter for antineoplastic chemotherapy: Secondary | ICD-10-CM

## 2010-08-12 DIAGNOSIS — I10 Essential (primary) hypertension: Secondary | ICD-10-CM

## 2010-08-12 LAB — CBC WITH DIFFERENTIAL/PLATELET
BASO%: 0.5 % (ref 0.0–2.0)
EOS%: 1.8 % (ref 0.0–7.0)
MCH: 33.5 pg (ref 25.1–34.0)
MCV: 100.9 fL (ref 79.5–101.0)
MONO%: 9.4 % (ref 0.0–14.0)
NEUT#: 4 10*3/uL (ref 1.5–6.5)
RBC: 3.34 10*6/uL — ABNORMAL LOW (ref 3.70–5.45)
RDW: 15.5 % — ABNORMAL HIGH (ref 11.2–14.5)

## 2010-09-19 LAB — POCT I-STAT, CHEM 8
Calcium, Ion: 1.23 mmol/L (ref 1.12–1.32)
Chloride: 95 mEq/L — ABNORMAL LOW (ref 96–112)
Glucose, Bld: 91 mg/dL (ref 70–99)
HCT: 45 % (ref 36.0–46.0)
TCO2: 33 mmol/L (ref 0–100)

## 2010-11-06 ENCOUNTER — Other Ambulatory Visit: Payer: Self-pay | Admitting: Internal Medicine

## 2010-11-06 ENCOUNTER — Encounter (HOSPITAL_COMMUNITY): Payer: Self-pay

## 2010-11-06 ENCOUNTER — Ambulatory Visit (HOSPITAL_COMMUNITY)
Admission: RE | Admit: 2010-11-06 | Discharge: 2010-11-06 | Disposition: A | Discharge status: Home / Self Care | Payer: No Typology Code available for payment source | Source: Ambulatory Visit | Attending: Internal Medicine | Admitting: Internal Medicine

## 2010-11-06 ENCOUNTER — Encounter (HOSPITAL_BASED_OUTPATIENT_CLINIC_OR_DEPARTMENT_OTHER): Payer: No Typology Code available for payment source | Admitting: Internal Medicine

## 2010-11-06 DIAGNOSIS — C3410 Malignant neoplasm of upper lobe, unspecified bronchus or lung: Secondary | ICD-10-CM

## 2010-11-06 DIAGNOSIS — E279 Disorder of adrenal gland, unspecified: Secondary | ICD-10-CM | POA: Insufficient documentation

## 2010-11-06 DIAGNOSIS — Z5111 Encounter for antineoplastic chemotherapy: Secondary | ICD-10-CM

## 2010-11-06 DIAGNOSIS — J438 Other emphysema: Secondary | ICD-10-CM | POA: Insufficient documentation

## 2010-11-06 DIAGNOSIS — R110 Nausea: Secondary | ICD-10-CM | POA: Insufficient documentation

## 2010-11-06 DIAGNOSIS — Z85118 Personal history of other malignant neoplasm of bronchus and lung: Secondary | ICD-10-CM | POA: Insufficient documentation

## 2010-11-06 DIAGNOSIS — M899 Disorder of bone, unspecified: Secondary | ICD-10-CM | POA: Insufficient documentation

## 2010-11-06 DIAGNOSIS — R079 Chest pain, unspecified: Secondary | ICD-10-CM | POA: Insufficient documentation

## 2010-11-06 DIAGNOSIS — J984 Other disorders of lung: Secondary | ICD-10-CM | POA: Insufficient documentation

## 2010-11-06 DIAGNOSIS — R0602 Shortness of breath: Secondary | ICD-10-CM | POA: Insufficient documentation

## 2010-11-06 DIAGNOSIS — R197 Diarrhea, unspecified: Secondary | ICD-10-CM | POA: Insufficient documentation

## 2010-11-06 DIAGNOSIS — I700 Atherosclerosis of aorta: Secondary | ICD-10-CM | POA: Insufficient documentation

## 2010-11-06 DIAGNOSIS — I77810 Thoracic aortic ectasia: Secondary | ICD-10-CM | POA: Insufficient documentation

## 2010-11-06 DIAGNOSIS — C3490 Malignant neoplasm of unspecified part of unspecified bronchus or lung: Secondary | ICD-10-CM

## 2010-11-06 DIAGNOSIS — R05 Cough: Secondary | ICD-10-CM | POA: Insufficient documentation

## 2010-11-06 DIAGNOSIS — R059 Cough, unspecified: Secondary | ICD-10-CM | POA: Insufficient documentation

## 2010-11-06 DIAGNOSIS — I10 Essential (primary) hypertension: Secondary | ICD-10-CM

## 2010-11-06 HISTORY — DX: Essential (primary) hypertension: I10

## 2010-11-06 LAB — CMP (CANCER CENTER ONLY)
AST: 23 U/L (ref 11–38)
Alkaline Phosphatase: 83 U/L (ref 26–84)
BUN, Bld: 8 mg/dL (ref 7–22)
Glucose, Bld: 103 mg/dL (ref 73–118)
Potassium: 5 mEq/L — ABNORMAL HIGH (ref 3.3–4.7)
Sodium: 140 mEq/L (ref 128–145)
Total Bilirubin: 0.9 mg/dl (ref 0.20–1.60)
Total Protein: 7.1 g/dL (ref 6.4–8.1)

## 2010-11-06 LAB — CBC WITH DIFFERENTIAL/PLATELET
EOS%: 4.3 % (ref 0.0–7.0)
Eosinophils Absolute: 0.2 10*3/uL (ref 0.0–0.5)
LYMPH%: 26.4 % (ref 14.0–49.7)
MCH: 32 pg (ref 25.1–34.0)
MCV: 93.9 fL (ref 79.5–101.0)
MONO%: 8.7 % (ref 0.0–14.0)
NEUT#: 2.4 10*3/uL (ref 1.5–6.5)
Platelets: 226 10*3/uL (ref 145–400)
RBC: 4.23 10*6/uL (ref 3.70–5.45)
RDW: 13.3 % (ref 11.2–14.5)

## 2010-11-06 MED ORDER — IOHEXOL 300 MG/ML  SOLN
100.0000 mL | Freq: Once | INTRAMUSCULAR | Status: AC | PRN
  Administered 2010-11-06: 100 mL via INTRAVENOUS

## 2010-11-11 ENCOUNTER — Encounter (HOSPITAL_BASED_OUTPATIENT_CLINIC_OR_DEPARTMENT_OTHER): Payer: No Typology Code available for payment source | Admitting: Internal Medicine

## 2010-11-11 DIAGNOSIS — C3410 Malignant neoplasm of upper lobe, unspecified bronchus or lung: Secondary | ICD-10-CM

## 2010-11-14 ENCOUNTER — Encounter (HOSPITAL_BASED_OUTPATIENT_CLINIC_OR_DEPARTMENT_OTHER): Payer: No Typology Code available for payment source | Admitting: Internal Medicine

## 2010-11-14 DIAGNOSIS — Z5111 Encounter for antineoplastic chemotherapy: Secondary | ICD-10-CM

## 2010-11-14 DIAGNOSIS — C3410 Malignant neoplasm of upper lobe, unspecified bronchus or lung: Secondary | ICD-10-CM

## 2010-11-22 NOTE — Op Note (Signed)
[name], [name]              ACCOUNT NO.:  [id number]   MEDICAL RECORD NO.:  [id number]          PATIENT TYPE:  INP   LOCATION:  1826                         FACILITY:  [facility]   PHYSICIAN:  Vania Rea. Supple, M.D.  DATE OF BIRTH:  1941-05-23   DATE OF PROCEDURE:  05/18/2005  DATE OF DISCHARGE:                                 OPERATIVE REPORT   PREOPERATIVE DIAGNOSIS:  Left intertrochanteric hip fracture.   POSTOPERATIVE DIAGNOSIS:  Left intertrochanteric hip fracture.   OPERATION PERFORMED:  Open reduction internal fixation of left  intertrochanteric hip fracture utilizing a gamma nail, intramedullary hip  screw, 130 degree neck angle, 90 mm lag screw and static lock distally.   SURGEON:  Vania Rea. Supple, M.D.   Threasa HeadsFrench Ana A. Shuford, P.A.-C.   ANESTHESIA:  General endotracheal.   ESTIMATED BLOOD LOSS:  Minimal.   DRAINS:  None.   INDICATIONS FOR PROCEDURE:  Ms. Grillot is a 70 year old female who fell at  home earlier today injuring her left hip with complaints of pain and  inability to bear weight.  On presentation to the emergency room, she was  found to have a foreshortened and externally rotated left lower extremity.  Severe pain with attempts at left hip motion.  Radiographs confirmed a  minimally displaced two part left intertrochanteric hip fracture.  She is  subsequently brought to the operating room at this time for planned left hip  open reduction internal fixation as described below.   I preoperatively discussed with Ms. Spaid treatment options as well as the  risks versus benefits thereof.  Possible surgical complications of bleeding,  infection, neurovascular injury, malunion, nonunion, loss of fixation and  possible need for additional surgery were reviewed.  She understands and  accept and agrees to our planned procedure.  Additionally, on the  preoperative chest x-ray there were noted to be two nodules on the right  lung field with recommended  follow-up with a CT scan.  I discussed this with  Ms. Junio.  I will plan to obtain the CT scan postoperatively.   DESCRIPTION OF PROCEDURE:  After undergoing routine preoperative evaluation,  the patient received prophylactic antibiotics and brought to the operating  room supine on the hospital bed, underwent smooth induction of general  endotracheal anesthesia.  Transferred to the fracture table with the left  leg placed into gentle traction.  The right leg placed in the well leg  holder.  Appropriately padded and protected throughout.  Fluoroscopic images  were then obtained showing good alignment of the left hip in the AP and  lateral views.  The left hip girdle region was then sterilely prepped and  draped in standard fashion.  A 3 cm stab wound incision was then made three  fingerbreadths proximal to the tip of the greater trochanter. Sharp  dissection carried down through skin and subcutaneous and deep fascia.  The  starting awl was then directed to the tip of the greater trochanter and  gained access to the proximal femur, proper positioning confirmed  fluoroscopically.  A ball tip guidewire was then directed down into the  femur and this was then overdrilled with the standard reamer.  Over this we  then placed the gamma nail and seated it to the appropriate depth.  The  guidewire was then removed.  Using the outrigger arm, a threaded tip guide  pin was then directed up into the femoral neck and head through a separate  stab wound.  This was then overdrilled with a reamer and then a 90 mm lag  screw was placed to the appropriate depth, proper position confirmed on both  AP and lateral fluoroscopic images.  Using the outrigger guide we then  placed a static distal locking screw through the gamma nail under  fluoroscopic guidance.  Final radiographs showed good alignment at the  fracture site and good position of the hardware.  The outrigger was removed.  All wounds were copiously  irrigated, hemostasis was obtained.  The wound was  closed with 2-0 Vicryl subcu and intracuticular 3-0 Monocryl for the skin  followed by Steri-Strips.  A bulky dry dressing was then taped over the left  hip.  The patient was then taken out of traction, extubated and transferred  to her hospital bed and taken to the recovery room in stable condition.      Vania Rea. Supple, M.D.  Electronically Signed     KMS/MEDQ  D:  05/18/2005  T:  05/19/2005  Job:  [id number]

## 2010-11-22 NOTE — Discharge Summary (Signed)
NAMEGRICEL, COPEN              ACCOUNT NO.:  [id number]   MEDICAL RECORD NO.:  [id number]          PATIENT TYPE:  INP   LOCATION:  5729                         FACILITY:  [facility]   PHYSICIAN:  Vania Rea. Supple, M.D.  DATE OF BIRTH:  Aug 11, 1940   DATE OF ADMISSION:  05/18/2005  DATE OF DISCHARGE:  05/23/2005                                 DISCHARGE SUMMARY   ADMISSION DIAGNOSES:  1.  Left intertrochanteric hip fracture.  2.  Hypertension.  3.  Chronic obstructive pulmonary disease.   DISCHARGE DIAGNOSES:  1.  Left intertrochanteric hip fracture.  2.  Hypertension.  3.  Chronic obstructive pulmonary disease.  4.  Status post open reduction and internal fixation of the left hip.  5.  Postoperative hypoxemia with underlying chronic obstructive pulmonary      disease and CT scan confirming right upper lobe nodule.   OPERATIONS:  Open reduction and internal fixation of the left hip.   SURGEON:  Vania Rea. Supple, M.D.   Threasa HeadsFrench Ana A. Shuford, P.A.-C.   ANESTHESIA:  General anesthetic.   BRIEF HISTORY:  Ms. [name] is a 70 year old female who fell and has had the  significant onset of left hip pain with inability to bear weight.  She was  brought to the [facility] emergency room where x-rays confirmed a left  intertrochanteric hip fracture.  Chest x-ray showed two right lung nodules  and further workup was indicated.  However, this was felt to be stable to  tolerate surgical fixation.  The risks and benefits were discussed with the  patient in regard to the nature of her fracture and operation was  recommended.  She was in agreement and wished to proceed.   HOSPITAL COURSE:  The patient was admitted and underwent the above-named  procedure and tolerated this well.  All appropriate intravenous antibiotics  and analgesics were utilized.  Postoperatively, she did have some hypoxemia  and medical consult with Dr. Craige Cotta was obtained.  He saw, evaluated, and  cared for her  postoperatively.  By date 05/23/2005, she had stabilized but  was still requiring 2 L of oxygen and was felt to be stable for discharge on  home oxygen.  She had had a 2-D echocardiogram due to a significant murmur  also.  Outpatient followup was recommended in regard to pulmonary function  as well as CT findings of her chest.  Orthopedically, on 05/23/2005, she was  neurovascularly intact.  She was tolerating ambulation and weight-bearing as  tolerated with a walker.  Dr. Craige Cotta had seen and evaluated her and made final  recommendations on outpatient medicines.  At this time, she was medically  orthopedically stable for discharge to home.   LABORATORY DATA:  Admission hemoglobin 14, at discharge 13.1 and stable.  Blood gases noted on 05/18/2005 at 7.36, pCO2 60, bicarbonate 34.  Postoperatively, pH of 7.46, pCO2 48, pO2 51.  On Coumadin for DVT and PE  prophylaxis and multiple coag's are in the chart.  See the chart for  details.  She had a mild hyperkalemia which was 3.4 at the time of  discharge.   RADIOLOGIC DATA:  X-rays on 05/18/2005 showed two right lung nodules with  interval development since 9/05.  CT was recommended.  Left hip showed  intertrochanteric fracture and postoperatively showed anatomic alignment.  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  Bilateral mild  atelectasis noted.  Chest x-ray on 05/21/2005 showed small bilateral pleural  effusions and a repeat on 05/23/2005, showed a persistent left retrocardiac  consolidation and atelectasis, and again, right upper lobe nodules  demonstrated.  Echocardiogram not in this section at the time of dictation.   CONDITION ON DISCHARGE:  Stable and improved.   DISCHARGE PLAN:  The patient will be discharged to home to resume her home  medicines.  She is recommended for Spiriva and Xopenex medications.  Financial aid was attempted and if not  available, she was placed on Atrovent  and Ventolin inhalers.   RECOMMENDED FOLLOWUP:  Dr. Craige Cotta in 1-2 weeks.  Home oxygen was arranged.  Home health PT and OT were arranged.  Weightbearing as tolerated to the  upper extremity.  She is on Coumadin for DVT and PE prophylaxis for a total  of three weeks.  Pain medications and a prescription for Percocet was  provided.  Home diet.  Follow up in our office in two weeks; call for a  time.  May shower at this time.      Tracy A. Shuford, P.A.-C.      Vania Rea. Supple, M.D.  Electronically Signed    TAS/MEDQ  D:  07/20/2005  T:  07/21/2005  Job:  [id number]

## 2010-11-22 NOTE — Consult Note (Signed)
NAME:  [name], [name]              ACCOUNT NO.:  [id number]   MEDICAL RECORD NO.:  [id number]          PATIENT TYPE:  INP   LOCATION:  5729                         FACILITY:  [facility]   PHYSICIAN:  Coralyn Helling, M.D.      DATE OF BIRTH:  [birth year]   DATE OF CONSULTATION:  05/22/2005  DATE OF DISCHARGE:                                   CONSULTATION   REASON FOR CONSULTATION:  Hypoxia, atelectasis, pleural effusion, and right  upper lung nodule.   HISTORY:  This is a 70 year old female who was admitted on May 18, 2005, after falling and fracturing her left hip.  She is status post ORIF  for this.  She has a 50-pack-year history of smoking and currently smokes  one pack cigarettes per day.  She has baseline dyspnea on exertion, she is  able to walk approximately one block before getting short of breath.  She  also has frequent coughing, more so in the morning.  She says that the  phlegm is usually clear but sometimes can be brownish in color and  occasionally has blood streaking in it, as well.  She has additionally lost  30 pounds over the last 1 to 1 1/2 years.  She had a CAT scan done and chest  x-ray done in 2004 which showed a right upper lobe nodule.  She says that  while she was originally scheduled to have biopsy of this, she was later  told that this was just pneumonia, and it does not appear that any tissue  diagnosis was made of this at that time.  I am not sure if she has had any  follow up for this since then.  During this hospital admission, she was  noted to have oxygen desaturations while ambulating in the hall to as low as  82% with an increase in her heart rate.  A chest x-ray done during this  admission showed the right upper lobe nodule, again, which seemed to be more  prominent in nature and a CT scan of the chest was ordered and this did  verify the presence of the right upper lobe nodule which did appear to have  increased in size.  The CT scan of the chest  also revealed that she has  atelectasis of the left lower lung with pleural effusion and elevation of  her left hemidiaphragm.  She says that when she is at rest, her breathing is  comfortable although she is currently on supplemental oxygen at this time.  She has not had any real significant change in her cough or sputum  production and she denies headaches, dizziness, blurred vision, difficulty  swallowing, or changes in her voice.  She also denies chest pain.  She says  she does occasionally hear herself wheezing.   PAST MEDICAL HISTORY:  Significant for hypertension and she has had hip  surgery.   ALLERGIES:  No known drug allergies.   CURRENT MEDICATIONS:  Catapres 0.1 mg daily, Colace 100 mg b.i.d., Coumadin,  hydrochlorothiazide 50 mg daily, Ventolin inhaler q.i.d., and p.r.n.  Percocet.   SOCIAL HISTORY:  She smokes one pack per cigarettes per day, has a 50-pack-  year history of smoking, she has occasional alcohol use.  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   FAMILY HISTORY:  Significant for cancer, diabetes, heart disease, and  hypertension.   PHYSICAL EXAMINATION:  GENERAL:  She is seen in her hospital bed.  She is awake, alert and  oriented, in no acute distress.  VITAL SIGNS:  Blood pressure 129/69, heart rate 100 and regular, respiratory  rate 22, temperature 98.7, oxygen saturation is 91% on 3 liters nasal  cannula.  HEENT:  Pupils equal and reactive.  Extraocular movements intact.  There is  no sinus tenderness, no nasal discharge.  There is no oral lesion, no  lymphadenopathy, no thyromegaly.  HEART:  S1 and S2 with a 2/6 blowing holosystolic murmur.  CHEST:  She has decreased breath sounds, there are rales heard at the left  base, there is a prolonged expiratory phase.  ABDOMEN:  Soft, nontender, positive bowel sounds.  EXTREMITIES:  No cyanosis, clubbing, and edema.  NEUROLOGICAL:  She has 5/5 strength in the upper and lower extremities, no   cerebellar deficits were noted.   LABORATORY DATA:  WBC 9.3, hemoglobin 16.1, hematocrit 47.6, platelets 278.  Sodium 135, potassium 3.4, chloride 92, CO2 35, BUN 1, creatinine 0.5,  glucose 112.  PT 23.3, INR 2.1.  Calcium 9.2.  CT scan of the chest and  chest x-ray results are as stated above.   IMPRESSION:  She has hypoxia with ambulation.  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  I will add Atrovent nebulizer to  her Ventolin nebulizer treatment and give this to her in addition to using a  flutter valve for her.  Continue her on her incentive spirometry.  I would  like to follow up with a repeat chest x-ray for tomorrow morning and check  an arterial blood gas analysis on room air for tomorrow, as well.  I have  advised her with regards to the importance of smoking cessation.  With  regards to her right upper lobe nodule, again, I do not believe that this  has ever been actually confirmed to be a malignancy, although it certainly  has the appearance of this based on a CAT scan, in addition to her  significant smoking history.  For this, I think tissue confirmation would be  warranted but given the fact that she is on Coumadin and her INR is 2.1, we  would need to make adjustments in her anticoagulation regimen prior to her  undergoing biopsy.  However, this can be arranged as an outpatient.  She  should also have pulmonary function testing to evaluate the degree of her  air flow obstruction which can also be arranged as an outpatient.  Also of  note is that she has a holosystolic blowing murmur on cardiac exam.  To  further evaluate this, I would like her to have a 2D echocardiogram and  determine if any further interventions will be needed based on review of  that.  Otherwise, I feel that it would be warranted to keep her in the hospital at least for another 24 hours and then reassess her  clinical  status.  Then, I would be happy to take her onto my service for the  remainder of her hospitalization as it appears most of her remaining issues  are pulmonary in  nature.  But, we will follow along with you.      Coralyn Helling, M.D.  Electronically Signed     VS/MEDQ  D:  05/22/2005  T:  05/22/2005  Job:  [id number]   cc:   Vania Rea. Supple, M.D.  Fax: [phone]

## 2010-12-05 ENCOUNTER — Other Ambulatory Visit: Payer: Self-pay | Admitting: Internal Medicine

## 2010-12-05 ENCOUNTER — Encounter (HOSPITAL_BASED_OUTPATIENT_CLINIC_OR_DEPARTMENT_OTHER): Payer: No Typology Code available for payment source | Admitting: Internal Medicine

## 2010-12-05 DIAGNOSIS — C3410 Malignant neoplasm of upper lobe, unspecified bronchus or lung: Secondary | ICD-10-CM

## 2010-12-05 DIAGNOSIS — Z5111 Encounter for antineoplastic chemotherapy: Secondary | ICD-10-CM

## 2010-12-05 DIAGNOSIS — I10 Essential (primary) hypertension: Secondary | ICD-10-CM

## 2010-12-05 LAB — COMPREHENSIVE METABOLIC PANEL
BUN: 8 mg/dL (ref 6–23)
CO2: 28 mEq/L (ref 19–32)
Creatinine, Ser: 0.4 mg/dL (ref 0.40–1.20)
Glucose, Bld: 120 mg/dL — ABNORMAL HIGH (ref 70–99)
Total Bilirubin: 0.5 mg/dL (ref 0.3–1.2)

## 2010-12-05 LAB — CBC WITH DIFFERENTIAL/PLATELET
Basophils Absolute: 0 10*3/uL (ref 0.0–0.1)
EOS%: 0 % (ref 0.0–7.0)
Eosinophils Absolute: 0 10*3/uL (ref 0.0–0.5)
HGB: 12.5 g/dL (ref 11.6–15.9)
LYMPH%: 8.5 % — ABNORMAL LOW (ref 14.0–49.7)
MCH: 31.2 pg (ref 25.1–34.0)
MCV: 89.5 fL (ref 79.5–101.0)
MONO%: 7.4 % (ref 0.0–14.0)
NEUT#: 5.3 10*3/uL (ref 1.5–6.5)
Platelets: 337 10*3/uL (ref 145–400)

## 2010-12-26 ENCOUNTER — Other Ambulatory Visit: Payer: Self-pay | Admitting: Internal Medicine

## 2010-12-26 ENCOUNTER — Encounter (HOSPITAL_BASED_OUTPATIENT_CLINIC_OR_DEPARTMENT_OTHER): Payer: No Typology Code available for payment source | Admitting: Internal Medicine

## 2010-12-26 DIAGNOSIS — C3490 Malignant neoplasm of unspecified part of unspecified bronchus or lung: Secondary | ICD-10-CM

## 2010-12-26 DIAGNOSIS — C3410 Malignant neoplasm of upper lobe, unspecified bronchus or lung: Secondary | ICD-10-CM

## 2010-12-26 DIAGNOSIS — Z5111 Encounter for antineoplastic chemotherapy: Secondary | ICD-10-CM

## 2010-12-26 LAB — COMPREHENSIVE METABOLIC PANEL
ALT: 35 U/L (ref 0–35)
CO2: 28 mEq/L (ref 19–32)
Calcium: 10.1 mg/dL (ref 8.4–10.5)
Chloride: 94 mEq/L — ABNORMAL LOW (ref 96–112)
Creatinine, Ser: 0.46 mg/dL — ABNORMAL LOW (ref 0.50–1.10)
Total Protein: 6.6 g/dL (ref 6.0–8.3)

## 2010-12-26 LAB — CBC WITH DIFFERENTIAL/PLATELET
Basophils Absolute: 0 10*3/uL (ref 0.0–0.1)
Eosinophils Absolute: 0 10*3/uL (ref 0.0–0.5)
HCT: 35.4 % (ref 34.8–46.6)
HGB: 12.3 g/dL (ref 11.6–15.9)
LYMPH%: 6.6 % — ABNORMAL LOW (ref 14.0–49.7)
MONO#: 0.4 10*3/uL (ref 0.1–0.9)
NEUT#: 6 10*3/uL (ref 1.5–6.5)
NEUT%: 88.1 % — ABNORMAL HIGH (ref 38.4–76.8)
Platelets: 353 10*3/uL (ref 145–400)
RBC: 3.92 10*6/uL (ref 3.70–5.45)
WBC: 6.9 10*3/uL (ref 3.9–10.3)
nRBC: 0 % (ref 0–0)

## 2011-01-13 ENCOUNTER — Encounter (HOSPITAL_COMMUNITY): Payer: Self-pay

## 2011-01-13 ENCOUNTER — Ambulatory Visit (HOSPITAL_COMMUNITY)
Admission: RE | Admit: 2011-01-13 | Discharge: 2011-01-13 | Disposition: A | Discharge status: Home / Self Care | Payer: No Typology Code available for payment source | Source: Ambulatory Visit | Attending: Internal Medicine | Admitting: Internal Medicine

## 2011-01-13 DIAGNOSIS — K7689 Other specified diseases of liver: Secondary | ICD-10-CM | POA: Insufficient documentation

## 2011-01-13 DIAGNOSIS — R0602 Shortness of breath: Secondary | ICD-10-CM | POA: Insufficient documentation

## 2011-01-13 DIAGNOSIS — C3490 Malignant neoplasm of unspecified part of unspecified bronchus or lung: Secondary | ICD-10-CM

## 2011-01-13 DIAGNOSIS — D3500 Benign neoplasm of unspecified adrenal gland: Secondary | ICD-10-CM | POA: Insufficient documentation

## 2011-01-13 DIAGNOSIS — M25519 Pain in unspecified shoulder: Secondary | ICD-10-CM | POA: Insufficient documentation

## 2011-01-13 DIAGNOSIS — J984 Other disorders of lung: Secondary | ICD-10-CM | POA: Insufficient documentation

## 2011-01-13 DIAGNOSIS — M47814 Spondylosis without myelopathy or radiculopathy, thoracic region: Secondary | ICD-10-CM | POA: Insufficient documentation

## 2011-01-13 HISTORY — DX: Malignant (primary) neoplasm, unspecified: C80.1

## 2011-01-13 MED ORDER — IOHEXOL 300 MG/ML  SOLN
100.0000 mL | Freq: Once | INTRAMUSCULAR | Status: AC | PRN
  Administered 2011-01-13: 100 mL via INTRAVENOUS

## 2011-01-16 ENCOUNTER — Encounter (HOSPITAL_BASED_OUTPATIENT_CLINIC_OR_DEPARTMENT_OTHER): Payer: No Typology Code available for payment source | Admitting: Internal Medicine

## 2011-01-16 ENCOUNTER — Other Ambulatory Visit: Payer: Self-pay | Admitting: Internal Medicine

## 2011-01-16 DIAGNOSIS — C3410 Malignant neoplasm of upper lobe, unspecified bronchus or lung: Secondary | ICD-10-CM

## 2011-01-16 LAB — COMPREHENSIVE METABOLIC PANEL
ALT: 39 U/L — ABNORMAL HIGH (ref 0–35)
AST: 44 U/L — ABNORMAL HIGH (ref 0–37)
Albumin: 4 g/dL (ref 3.5–5.2)
Calcium: 10.6 mg/dL — ABNORMAL HIGH (ref 8.4–10.5)
Chloride: 97 mEq/L (ref 96–112)
Potassium: 3.7 mEq/L (ref 3.5–5.3)
Sodium: 135 mEq/L (ref 135–145)

## 2011-01-16 LAB — CBC WITH DIFFERENTIAL/PLATELET
BASO%: 1.4 % (ref 0.0–2.0)
HCT: 37.5 % (ref 34.8–46.6)
MCHC: 33.9 g/dL (ref 31.5–36.0)
MONO#: 0.8 10*3/uL (ref 0.1–0.9)
RBC: 3.97 10*6/uL (ref 3.70–5.45)
WBC: 5.1 10*3/uL (ref 3.9–10.3)
lymph#: 1.5 10*3/uL (ref 0.9–3.3)
nRBC: 0 % (ref 0–0)

## 2011-02-06 ENCOUNTER — Encounter (HOSPITAL_BASED_OUTPATIENT_CLINIC_OR_DEPARTMENT_OTHER): Payer: No Typology Code available for payment source | Admitting: Internal Medicine

## 2011-02-06 ENCOUNTER — Other Ambulatory Visit: Payer: Self-pay | Admitting: Internal Medicine

## 2011-02-06 DIAGNOSIS — K5900 Constipation, unspecified: Secondary | ICD-10-CM

## 2011-02-06 DIAGNOSIS — M7989 Other specified soft tissue disorders: Secondary | ICD-10-CM

## 2011-02-06 DIAGNOSIS — Z5111 Encounter for antineoplastic chemotherapy: Secondary | ICD-10-CM

## 2011-02-06 DIAGNOSIS — C3410 Malignant neoplasm of upper lobe, unspecified bronchus or lung: Secondary | ICD-10-CM

## 2011-02-06 LAB — COMPREHENSIVE METABOLIC PANEL
AST: 34 U/L (ref 0–37)
Albumin: 4.1 g/dL (ref 3.5–5.2)
Alkaline Phosphatase: 80 U/L (ref 39–117)
BUN: 8 mg/dL (ref 6–23)
Potassium: 3.4 mEq/L — ABNORMAL LOW (ref 3.5–5.3)
Sodium: 136 mEq/L (ref 135–145)
Total Bilirubin: 0.5 mg/dL (ref 0.3–1.2)

## 2011-02-06 LAB — CBC WITH DIFFERENTIAL/PLATELET
BASO%: 0.2 % (ref 0.0–2.0)
EOS%: 0 % (ref 0.0–7.0)
MCH: 32.8 pg (ref 25.1–34.0)
MCHC: 34.6 g/dL (ref 31.5–36.0)
MONO%: 7.2 % (ref 0.0–14.0)
NEUT%: 82.2 % — ABNORMAL HIGH (ref 38.4–76.8)
RDW: 15.9 % — ABNORMAL HIGH (ref 11.2–14.5)
lymph#: 0.5 10*3/uL — ABNORMAL LOW (ref 0.9–3.3)

## 2011-02-13 ENCOUNTER — Other Ambulatory Visit: Payer: Self-pay | Admitting: Internal Medicine

## 2011-02-13 ENCOUNTER — Encounter (HOSPITAL_BASED_OUTPATIENT_CLINIC_OR_DEPARTMENT_OTHER): Payer: No Typology Code available for payment source | Admitting: Internal Medicine

## 2011-02-13 DIAGNOSIS — C3410 Malignant neoplasm of upper lobe, unspecified bronchus or lung: Secondary | ICD-10-CM

## 2011-02-13 LAB — COMPREHENSIVE METABOLIC PANEL
AST: 58 U/L — ABNORMAL HIGH (ref 0–37)
Alkaline Phosphatase: 103 U/L (ref 39–117)
BUN: 11 mg/dL (ref 6–23)
Creatinine, Ser: <0.47 mg/dL — ABNORMAL LOW (ref 0.50–1.10)
Glucose, Bld: 117 mg/dL — ABNORMAL HIGH (ref 70–99)
Total Bilirubin: 0.5 mg/dL (ref 0.3–1.2)

## 2011-02-13 LAB — CBC WITH DIFFERENTIAL/PLATELET
Basophils Absolute: 0 10*3/uL (ref 0.0–0.1)
EOS%: 3.2 % (ref 0.0–7.0)
Eosinophils Absolute: 0.1 10*3/uL (ref 0.0–0.5)
HCT: 34.3 % — ABNORMAL LOW (ref 34.8–46.6)
HGB: 12 g/dL (ref 11.6–15.9)
LYMPH%: 34.7 % (ref 14.0–49.7)
MCH: 34.3 pg — ABNORMAL HIGH (ref 25.1–34.0)
MCV: 97.7 fL (ref 79.5–101.0)
MONO%: 17.7 % — ABNORMAL HIGH (ref 0.0–14.0)
NEUT%: 43.6 % (ref 38.4–76.8)
Platelets: 244 10*3/uL (ref 145–400)
RDW: 15.9 % — ABNORMAL HIGH (ref 11.2–14.5)

## 2011-02-20 ENCOUNTER — Encounter (HOSPITAL_BASED_OUTPATIENT_CLINIC_OR_DEPARTMENT_OTHER): Payer: No Typology Code available for payment source | Admitting: Internal Medicine

## 2011-02-20 ENCOUNTER — Other Ambulatory Visit: Payer: Self-pay | Admitting: Internal Medicine

## 2011-02-20 DIAGNOSIS — C3410 Malignant neoplasm of upper lobe, unspecified bronchus or lung: Secondary | ICD-10-CM

## 2011-02-20 DIAGNOSIS — C3490 Malignant neoplasm of unspecified part of unspecified bronchus or lung: Secondary | ICD-10-CM

## 2011-02-20 LAB — COMPREHENSIVE METABOLIC PANEL
AST: 79 U/L — ABNORMAL HIGH (ref 0–37)
Albumin: 3.6 g/dL (ref 3.5–5.2)
Alkaline Phosphatase: 85 U/L (ref 39–117)
BUN: 10 mg/dL (ref 6–23)
Calcium: 9.4 mg/dL (ref 8.4–10.5)
Chloride: 95 mEq/L — ABNORMAL LOW (ref 96–112)
Creatinine, Ser: 0.49 mg/dL — ABNORMAL LOW (ref 0.50–1.10)
Glucose, Bld: 126 mg/dL — ABNORMAL HIGH (ref 70–99)
Potassium: 3.6 mEq/L (ref 3.5–5.3)

## 2011-02-20 LAB — CBC WITH DIFFERENTIAL/PLATELET
Basophils Absolute: 0 10*3/uL (ref 0.0–0.1)
EOS%: 2.3 % (ref 0.0–7.0)
Eosinophils Absolute: 0.1 10*3/uL (ref 0.0–0.5)
HCT: 35 % (ref 34.8–46.6)
HGB: 11.8 g/dL (ref 11.6–15.9)
MCH: 32.3 pg (ref 25.1–34.0)
MCV: 95.9 fL (ref 79.5–101.0)
MONO%: 15.6 % — ABNORMAL HIGH (ref 0.0–14.0)
NEUT#: 2.5 10*3/uL (ref 1.5–6.5)
NEUT%: 53.5 % (ref 38.4–76.8)

## 2011-02-27 ENCOUNTER — Encounter (HOSPITAL_BASED_OUTPATIENT_CLINIC_OR_DEPARTMENT_OTHER): Payer: No Typology Code available for payment source | Admitting: Internal Medicine

## 2011-02-27 ENCOUNTER — Other Ambulatory Visit: Payer: Self-pay | Admitting: Internal Medicine

## 2011-02-27 DIAGNOSIS — Z5111 Encounter for antineoplastic chemotherapy: Secondary | ICD-10-CM

## 2011-02-27 DIAGNOSIS — C3410 Malignant neoplasm of upper lobe, unspecified bronchus or lung: Secondary | ICD-10-CM

## 2011-02-27 LAB — COMPREHENSIVE METABOLIC PANEL WITH GFR
ALT: 47 U/L — ABNORMAL HIGH (ref 0–35)
AST: 39 U/L — ABNORMAL HIGH (ref 0–37)
Albumin: 4.2 g/dL (ref 3.5–5.2)
Alkaline Phosphatase: 72 U/L (ref 39–117)
BUN: 11 mg/dL (ref 6–23)
CO2: 28 meq/L (ref 19–32)
Calcium: 10 mg/dL (ref 8.4–10.5)
Chloride: 96 meq/L (ref 96–112)
Creatinine, Ser: 0.48 mg/dL — ABNORMAL LOW (ref 0.50–1.10)
Glucose, Bld: 118 mg/dL — ABNORMAL HIGH (ref 70–99)
Potassium: 3.7 meq/L (ref 3.5–5.3)
Sodium: 134 meq/L — ABNORMAL LOW (ref 135–145)
Total Bilirubin: 0.6 mg/dL (ref 0.3–1.2)
Total Protein: 6.8 g/dL (ref 6.0–8.3)

## 2011-02-27 LAB — CBC WITH DIFFERENTIAL/PLATELET
BASO%: 0 % (ref 0.0–2.0)
Basophils Absolute: 0 10e3/uL (ref 0.0–0.1)
EOS%: 0 % (ref 0.0–7.0)
Eosinophils Absolute: 0 10e3/uL (ref 0.0–0.5)
HCT: 36.7 % (ref 34.8–46.6)
HGB: 12.5 g/dL (ref 11.6–15.9)
LYMPH%: 7 % — ABNORMAL LOW (ref 14.0–49.7)
MCH: 32.6 pg (ref 25.1–34.0)
MCHC: 34.1 g/dL (ref 31.5–36.0)
MCV: 95.8 fL (ref 79.5–101.0)
MONO#: 0.4 10e3/uL (ref 0.1–0.9)
MONO%: 6.6 % (ref 0.0–14.0)
NEUT#: 5.7 10e3/uL (ref 1.5–6.5)
NEUT%: 86.4 % — ABNORMAL HIGH (ref 38.4–76.8)
Platelets: 287 10e3/uL (ref 145–400)
RBC: 3.83 10e6/uL (ref 3.70–5.45)
RDW: 15.2 % — ABNORMAL HIGH (ref 11.2–14.5)
WBC: 6.5 10e3/uL (ref 3.9–10.3)
lymph#: 0.5 10e3/uL — ABNORMAL LOW (ref 0.9–3.3)
nRBC: 0 % (ref 0–0)

## 2011-03-06 ENCOUNTER — Other Ambulatory Visit: Payer: Self-pay | Admitting: Internal Medicine

## 2011-03-06 ENCOUNTER — Encounter (HOSPITAL_BASED_OUTPATIENT_CLINIC_OR_DEPARTMENT_OTHER): Payer: No Typology Code available for payment source | Admitting: Internal Medicine

## 2011-03-06 DIAGNOSIS — C3410 Malignant neoplasm of upper lobe, unspecified bronchus or lung: Secondary | ICD-10-CM

## 2011-03-06 LAB — COMPREHENSIVE METABOLIC PANEL
AST: 44 U/L — ABNORMAL HIGH (ref 0–37)
Alkaline Phosphatase: 85 U/L (ref 39–117)
BUN: 12 mg/dL (ref 6–23)
Creatinine, Ser: 0.44 mg/dL — ABNORMAL LOW (ref 0.50–1.10)

## 2011-03-06 LAB — CBC WITH DIFFERENTIAL/PLATELET
Basophils Absolute: 0 10*3/uL (ref 0.0–0.1)
EOS%: 2.4 % (ref 0.0–7.0)
HCT: 34.4 % — ABNORMAL LOW (ref 34.8–46.6)
HGB: 12 g/dL (ref 11.6–15.9)
MCH: 34 pg (ref 25.1–34.0)
MCHC: 35 g/dL (ref 31.5–36.0)
MCV: 97.2 fL (ref 79.5–101.0)
MONO%: 19 % — ABNORMAL HIGH (ref 0.0–14.0)
NEUT%: 40.3 % (ref 38.4–76.8)
RDW: 14.9 % — ABNORMAL HIGH (ref 11.2–14.5)

## 2011-03-14 ENCOUNTER — Ambulatory Visit (HOSPITAL_COMMUNITY)
Admission: RE | Admit: 2011-03-14 | Discharge: 2011-03-14 | Disposition: A | Discharge status: Home / Self Care | Payer: No Typology Code available for payment source | Source: Ambulatory Visit | Attending: Internal Medicine | Admitting: Internal Medicine

## 2011-03-14 DIAGNOSIS — K5900 Constipation, unspecified: Secondary | ICD-10-CM | POA: Insufficient documentation

## 2011-03-14 DIAGNOSIS — C3490 Malignant neoplasm of unspecified part of unspecified bronchus or lung: Secondary | ICD-10-CM | POA: Insufficient documentation

## 2011-03-14 DIAGNOSIS — M948X9 Other specified disorders of cartilage, unspecified sites: Secondary | ICD-10-CM | POA: Insufficient documentation

## 2011-03-14 DIAGNOSIS — R599 Enlarged lymph nodes, unspecified: Secondary | ICD-10-CM | POA: Insufficient documentation

## 2011-03-14 DIAGNOSIS — I712 Thoracic aortic aneurysm, without rupture, unspecified: Secondary | ICD-10-CM | POA: Insufficient documentation

## 2011-03-14 DIAGNOSIS — E041 Nontoxic single thyroid nodule: Secondary | ICD-10-CM | POA: Insufficient documentation

## 2011-03-14 DIAGNOSIS — R110 Nausea: Secondary | ICD-10-CM | POA: Insufficient documentation

## 2011-03-14 DIAGNOSIS — I2510 Atherosclerotic heart disease of native coronary artery without angina pectoris: Secondary | ICD-10-CM | POA: Insufficient documentation

## 2011-03-14 DIAGNOSIS — K7689 Other specified diseases of liver: Secondary | ICD-10-CM | POA: Insufficient documentation

## 2011-03-14 DIAGNOSIS — I700 Atherosclerosis of aorta: Secondary | ICD-10-CM | POA: Insufficient documentation

## 2011-03-14 DIAGNOSIS — R0789 Other chest pain: Secondary | ICD-10-CM | POA: Insufficient documentation

## 2011-03-14 DIAGNOSIS — J984 Other disorders of lung: Secondary | ICD-10-CM | POA: Insufficient documentation

## 2011-03-14 MED ORDER — IOHEXOL 300 MG/ML  SOLN
100.0000 mL | Freq: Once | INTRAMUSCULAR | Status: AC | PRN
  Administered 2011-03-14: 100 mL via INTRAVENOUS

## 2011-03-18 ENCOUNTER — Other Ambulatory Visit: Payer: Self-pay | Admitting: Internal Medicine

## 2011-03-18 ENCOUNTER — Encounter (HOSPITAL_BASED_OUTPATIENT_CLINIC_OR_DEPARTMENT_OTHER): Payer: No Typology Code available for payment source | Admitting: Internal Medicine

## 2011-03-18 DIAGNOSIS — C3410 Malignant neoplasm of upper lobe, unspecified bronchus or lung: Secondary | ICD-10-CM

## 2011-03-18 DIAGNOSIS — M7989 Other specified soft tissue disorders: Secondary | ICD-10-CM

## 2011-03-18 DIAGNOSIS — Z5111 Encounter for antineoplastic chemotherapy: Secondary | ICD-10-CM

## 2011-03-18 DIAGNOSIS — K5900 Constipation, unspecified: Secondary | ICD-10-CM

## 2011-03-18 LAB — CBC WITH DIFFERENTIAL/PLATELET
BASO%: 0.6 % (ref 0.0–2.0)
Basophils Absolute: 0 10e3/uL (ref 0.0–0.1)
EOS%: 2.3 % (ref 0.0–7.0)
Eosinophils Absolute: 0.1 10e3/uL (ref 0.0–0.5)
HCT: 36.1 % (ref 34.8–46.6)
HGB: 12.4 g/dL (ref 11.6–15.9)
LYMPH%: 22.2 % (ref 14.0–49.7)
MCH: 34.1 pg — ABNORMAL HIGH (ref 25.1–34.0)
MCHC: 34.3 g/dL (ref 31.5–36.0)
MCV: 99.6 fL (ref 79.5–101.0)
MONO#: 0.7 10e3/uL (ref 0.1–0.9)
MONO%: 12.5 % (ref 0.0–14.0)
NEUT#: 3.3 10e3/uL (ref 1.5–6.5)
NEUT%: 62.4 % (ref 38.4–76.8)
Platelets: 277 10e3/uL (ref 145–400)
RBC: 3.62 10e6/uL — ABNORMAL LOW (ref 3.70–5.45)
RDW: 15.5 % — ABNORMAL HIGH (ref 11.2–14.5)
WBC: 5.3 10e3/uL (ref 3.9–10.3)
lymph#: 1.2 10e3/uL (ref 0.9–3.3)

## 2011-03-18 LAB — COMPREHENSIVE METABOLIC PANEL WITH GFR
ALT: 44 U/L — ABNORMAL HIGH (ref 0–35)
AST: 43 U/L — ABNORMAL HIGH (ref 0–37)
Albumin: 3.9 g/dL (ref 3.5–5.2)
Alkaline Phosphatase: 74 U/L (ref 39–117)
BUN: 10 mg/dL (ref 6–23)
CO2: 29 meq/L (ref 19–32)
Calcium: 9.7 mg/dL (ref 8.4–10.5)
Chloride: 94 meq/L — ABNORMAL LOW (ref 96–112)
Creatinine, Ser: 0.55 mg/dL (ref 0.50–1.10)
Glucose, Bld: 90 mg/dL (ref 70–99)
Potassium: 4 meq/L (ref 3.5–5.3)
Sodium: 137 meq/L (ref 135–145)
Total Bilirubin: 0.6 mg/dL (ref 0.3–1.2)
Total Protein: 6.5 g/dL (ref 6.0–8.3)

## 2011-03-20 ENCOUNTER — Other Ambulatory Visit: Payer: Self-pay | Admitting: Internal Medicine

## 2011-03-20 ENCOUNTER — Encounter (HOSPITAL_BASED_OUTPATIENT_CLINIC_OR_DEPARTMENT_OTHER): Payer: No Typology Code available for payment source | Admitting: Internal Medicine

## 2011-03-20 DIAGNOSIS — M7989 Other specified soft tissue disorders: Secondary | ICD-10-CM

## 2011-03-20 DIAGNOSIS — K5900 Constipation, unspecified: Secondary | ICD-10-CM

## 2011-03-20 DIAGNOSIS — C3410 Malignant neoplasm of upper lobe, unspecified bronchus or lung: Secondary | ICD-10-CM

## 2011-03-20 DIAGNOSIS — Z5111 Encounter for antineoplastic chemotherapy: Secondary | ICD-10-CM

## 2011-03-20 LAB — CBC WITH DIFFERENTIAL/PLATELET
Basophils Absolute: 0 10*3/uL (ref 0.0–0.1)
Eosinophils Absolute: 0 10*3/uL (ref 0.0–0.5)
HCT: 35.4 % (ref 34.8–46.6)
HGB: 12 g/dL (ref 11.6–15.9)
MONO#: 0.4 10*3/uL (ref 0.1–0.9)
NEUT%: 85.9 % — ABNORMAL HIGH (ref 38.4–76.8)
Platelets: 334 10*3/uL (ref 145–400)
WBC: 6.6 10*3/uL (ref 3.9–10.3)
lymph#: 0.5 10*3/uL — ABNORMAL LOW (ref 0.9–3.3)

## 2011-03-20 LAB — COMPREHENSIVE METABOLIC PANEL
ALT: 38 U/L — ABNORMAL HIGH (ref 0–35)
BUN: 13 mg/dL (ref 6–23)
CO2: 30 mEq/L (ref 19–32)
Calcium: 10.3 mg/dL (ref 8.4–10.5)
Chloride: 93 mEq/L — ABNORMAL LOW (ref 96–112)
Creatinine, Ser: 0.68 mg/dL (ref 0.50–1.10)
Glucose, Bld: 144 mg/dL — ABNORMAL HIGH (ref 70–99)

## 2011-04-08 ENCOUNTER — Other Ambulatory Visit: Payer: Self-pay | Admitting: Internal Medicine

## 2011-04-08 ENCOUNTER — Encounter (HOSPITAL_BASED_OUTPATIENT_CLINIC_OR_DEPARTMENT_OTHER): Payer: No Typology Code available for payment source | Admitting: Internal Medicine

## 2011-04-08 DIAGNOSIS — Z5111 Encounter for antineoplastic chemotherapy: Secondary | ICD-10-CM

## 2011-04-08 DIAGNOSIS — M7989 Other specified soft tissue disorders: Secondary | ICD-10-CM

## 2011-04-08 DIAGNOSIS — K5900 Constipation, unspecified: Secondary | ICD-10-CM

## 2011-04-08 DIAGNOSIS — C3410 Malignant neoplasm of upper lobe, unspecified bronchus or lung: Secondary | ICD-10-CM

## 2011-04-08 LAB — COMPREHENSIVE METABOLIC PANEL
ALT: 47 U/L — ABNORMAL HIGH (ref 0–35)
CO2: 28 mEq/L (ref 19–32)
Calcium: 9.7 mg/dL (ref 8.4–10.5)
Chloride: 97 mEq/L (ref 96–112)
Creatinine, Ser: 0.62 mg/dL (ref 0.50–1.10)
Total Protein: 6.1 g/dL (ref 6.0–8.3)

## 2011-04-08 LAB — CBC WITH DIFFERENTIAL/PLATELET
BASO%: 0.3 % (ref 0.0–2.0)
HCT: 35.8 % (ref 34.8–46.6)
HGB: 12.4 g/dL (ref 11.6–15.9)
MCHC: 34.6 g/dL (ref 31.5–36.0)
MONO#: 0.7 10*3/uL (ref 0.1–0.9)
NEUT#: 2.6 10*3/uL (ref 1.5–6.5)
NEUT%: 55.9 % (ref 38.4–76.8)
WBC: 4.6 10*3/uL (ref 3.9–10.3)
lymph#: 1.2 10*3/uL (ref 0.9–3.3)

## 2011-04-10 ENCOUNTER — Encounter (HOSPITAL_BASED_OUTPATIENT_CLINIC_OR_DEPARTMENT_OTHER): Payer: Medicaid Other | Admitting: Internal Medicine

## 2011-04-10 DIAGNOSIS — Z5111 Encounter for antineoplastic chemotherapy: Secondary | ICD-10-CM

## 2011-04-10 DIAGNOSIS — C3410 Malignant neoplasm of upper lobe, unspecified bronchus or lung: Secondary | ICD-10-CM

## 2011-04-25 ENCOUNTER — Encounter: Payer: Self-pay | Admitting: *Deleted

## 2011-05-01 ENCOUNTER — Other Ambulatory Visit: Payer: Self-pay | Admitting: Internal Medicine

## 2011-05-01 ENCOUNTER — Encounter (HOSPITAL_BASED_OUTPATIENT_CLINIC_OR_DEPARTMENT_OTHER): Payer: No Typology Code available for payment source | Admitting: Internal Medicine

## 2011-05-01 ENCOUNTER — Telehealth: Payer: Self-pay | Admitting: Internal Medicine

## 2011-05-01 DIAGNOSIS — K5900 Constipation, unspecified: Secondary | ICD-10-CM

## 2011-05-01 DIAGNOSIS — Z5111 Encounter for antineoplastic chemotherapy: Secondary | ICD-10-CM

## 2011-05-01 DIAGNOSIS — N390 Urinary tract infection, site not specified: Secondary | ICD-10-CM

## 2011-05-01 DIAGNOSIS — M7989 Other specified soft tissue disorders: Secondary | ICD-10-CM

## 2011-05-01 DIAGNOSIS — C3410 Malignant neoplasm of upper lobe, unspecified bronchus or lung: Secondary | ICD-10-CM

## 2011-05-01 DIAGNOSIS — C3490 Malignant neoplasm of unspecified part of unspecified bronchus or lung: Secondary | ICD-10-CM

## 2011-05-01 LAB — COMPREHENSIVE METABOLIC PANEL
ALT: 39 U/L — ABNORMAL HIGH (ref 0–35)
AST: 42 U/L — ABNORMAL HIGH (ref 0–37)
Albumin: 3.6 g/dL (ref 3.5–5.2)
Calcium: 10.4 mg/dL (ref 8.4–10.5)
Chloride: 95 mEq/L — ABNORMAL LOW (ref 96–112)
Potassium: 3.8 mEq/L (ref 3.5–5.3)
Sodium: 134 mEq/L — ABNORMAL LOW (ref 135–145)

## 2011-05-01 LAB — CBC WITH DIFFERENTIAL/PLATELET
BASO%: 0.7 % (ref 0.0–2.0)
HCT: 37.5 % (ref 34.8–46.6)
MCHC: 34.4 g/dL (ref 31.5–36.0)
MONO#: 0.7 10*3/uL (ref 0.1–0.9)
RBC: 3.87 10*6/uL (ref 3.70–5.45)
RDW: 14.3 % (ref 11.2–14.5)
WBC: 5.4 10*3/uL (ref 3.9–10.3)
lymph#: 1.2 10*3/uL (ref 0.9–3.3)
nRBC: 0 % (ref 0–0)

## 2011-05-01 NOTE — Telephone Encounter (Signed)
gv pt appt schedule for nov along w/ct appt for 11/12.

## 2011-05-19 ENCOUNTER — Ambulatory Visit (HOSPITAL_COMMUNITY)
Admission: RE | Admit: 2011-05-19 | Discharge: 2011-05-19 | Disposition: A | Discharge status: Home / Self Care | Payer: No Typology Code available for payment source | Source: Ambulatory Visit | Attending: Internal Medicine | Admitting: Internal Medicine

## 2011-05-19 DIAGNOSIS — R079 Chest pain, unspecified: Secondary | ICD-10-CM | POA: Insufficient documentation

## 2011-05-19 DIAGNOSIS — I700 Atherosclerosis of aorta: Secondary | ICD-10-CM | POA: Insufficient documentation

## 2011-05-19 DIAGNOSIS — R05 Cough: Secondary | ICD-10-CM | POA: Insufficient documentation

## 2011-05-19 DIAGNOSIS — R059 Cough, unspecified: Secondary | ICD-10-CM | POA: Insufficient documentation

## 2011-05-19 DIAGNOSIS — N9489 Other specified conditions associated with female genital organs and menstrual cycle: Secondary | ICD-10-CM | POA: Insufficient documentation

## 2011-05-19 DIAGNOSIS — C3490 Malignant neoplasm of unspecified part of unspecified bronchus or lung: Secondary | ICD-10-CM

## 2011-05-19 DIAGNOSIS — R0602 Shortness of breath: Secondary | ICD-10-CM | POA: Insufficient documentation

## 2011-05-19 DIAGNOSIS — E279 Disorder of adrenal gland, unspecified: Secondary | ICD-10-CM | POA: Insufficient documentation

## 2011-05-19 DIAGNOSIS — J984 Other disorders of lung: Secondary | ICD-10-CM | POA: Insufficient documentation

## 2011-05-19 DIAGNOSIS — Z79899 Other long term (current) drug therapy: Secondary | ICD-10-CM | POA: Insufficient documentation

## 2011-05-19 DIAGNOSIS — Z923 Personal history of irradiation: Secondary | ICD-10-CM | POA: Insufficient documentation

## 2011-05-19 DIAGNOSIS — R110 Nausea: Secondary | ICD-10-CM | POA: Insufficient documentation

## 2011-05-19 DIAGNOSIS — N281 Cyst of kidney, acquired: Secondary | ICD-10-CM | POA: Insufficient documentation

## 2011-05-19 DIAGNOSIS — J438 Other emphysema: Secondary | ICD-10-CM | POA: Insufficient documentation

## 2011-05-19 DIAGNOSIS — K7689 Other specified diseases of liver: Secondary | ICD-10-CM | POA: Insufficient documentation

## 2011-05-19 DIAGNOSIS — I708 Atherosclerosis of other arteries: Secondary | ICD-10-CM | POA: Insufficient documentation

## 2011-05-19 DIAGNOSIS — I517 Cardiomegaly: Secondary | ICD-10-CM | POA: Insufficient documentation

## 2011-05-19 MED ORDER — IOHEXOL 300 MG/ML  SOLN
100.0000 mL | Freq: Once | INTRAMUSCULAR | Status: AC | PRN
  Administered 2011-05-19: 100 mL via INTRAVENOUS

## 2011-05-20 ENCOUNTER — Other Ambulatory Visit: Payer: Self-pay | Admitting: Internal Medicine

## 2011-05-20 DIAGNOSIS — C3490 Malignant neoplasm of unspecified part of unspecified bronchus or lung: Secondary | ICD-10-CM

## 2011-05-22 ENCOUNTER — Ambulatory Visit: Payer: No Typology Code available for payment source

## 2011-05-22 ENCOUNTER — Ambulatory Visit (HOSPITAL_BASED_OUTPATIENT_CLINIC_OR_DEPARTMENT_OTHER): Payer: No Typology Code available for payment source | Admitting: Internal Medicine

## 2011-05-22 ENCOUNTER — Other Ambulatory Visit: Payer: Self-pay | Admitting: Internal Medicine

## 2011-05-22 ENCOUNTER — Other Ambulatory Visit (HOSPITAL_BASED_OUTPATIENT_CLINIC_OR_DEPARTMENT_OTHER): Payer: No Typology Code available for payment source

## 2011-05-22 VITALS — BP 141/79 | HR 82 | Temp 97.8°F | Ht 64.0 in | Wt 131.0 lb

## 2011-05-22 DIAGNOSIS — C3410 Malignant neoplasm of upper lobe, unspecified bronchus or lung: Secondary | ICD-10-CM

## 2011-05-22 DIAGNOSIS — C3490 Malignant neoplasm of unspecified part of unspecified bronchus or lung: Secondary | ICD-10-CM

## 2011-05-22 DIAGNOSIS — R110 Nausea: Secondary | ICD-10-CM

## 2011-05-22 DIAGNOSIS — R5381 Other malaise: Secondary | ICD-10-CM

## 2011-05-22 DIAGNOSIS — R5383 Other fatigue: Secondary | ICD-10-CM

## 2011-05-22 LAB — CBC WITH DIFFERENTIAL/PLATELET
BASO%: 0.2 % (ref 0.0–2.0)
EOS%: 0.2 % (ref 0.0–7.0)
HCT: 38.1 % (ref 34.8–46.6)
MCH: 33 pg (ref 25.1–34.0)
MCHC: 33.9 g/dL (ref 31.5–36.0)
MONO%: 6.4 % (ref 0.0–14.0)
NEUT%: 84.7 % — ABNORMAL HIGH (ref 38.4–76.8)
RDW: 14.9 % — ABNORMAL HIGH (ref 11.2–14.5)
lymph#: 0.6 10*3/uL — ABNORMAL LOW (ref 0.9–3.3)

## 2011-05-22 NOTE — Progress Notes (Signed)
OFFICE PROGRESS NOTE  LITTLE,JAMES, MD [address] [place] [address]  DIAGNOSIS: Recurrent non-small cell lung cancer, adenocarcinoma, initially diagnosed with synchronous stage I involving the right upper lobe and left upper lobe in January 2007.  PRIOR THERAPY:  1. Status post bilateral stereotactic radiotherapy under the care of Dr. Abelardo Diesel at [facility] completed Nov 25, 2005. 2. Status post 6 cycles of systemic chemotherapy with carboplatin and paclitaxel for disease recurrence.  Last dose given August 27, 2006  discontinued as the patient had disease stabilization. 3. Status post 6 more cycles of systemic chemotherapy for disease progression with carboplatin and paclitaxel, last dose given July 22, 2010 when the patient had disease stabilization and the treatment was discontinued at that time. 4. Systemic chemotherapy with Alimta at 500 mg/m2 given every 3 weeks status post 9 cycles.  CURRENT THERAPY: Observation.  INTERVAL HISTORY: [name] 70 y.o. female returns to the clinic today accompanied by her niece for followup visit. The patient her to the last cycle of her chemotherapy fairly well except for mild fatigue and occasional nausea. She denied having any significant chest pain but continues to have some short of breath with exertion. She has no significant weight loss.  Unfortunately the patient continues to smoke. She has repeat CT scan of the chest, abdomen and pelvis performed on 11/12/201 and she is here today for evaluation and discussion of her scan results.  MEDICAL HISTORY: Past Medical History  Diagnosis Date  . Hypertension   . Cancer     lung ca  . Heart murmur   . Lung cancer   . Hip fracture, left   . COPD (chronic obstructive pulmonary disease)   . Skin cancer     ALLERGIES:  is allergic to other.  MEDICATIONS:  Current Outpatient Prescriptions  Medication Sig Dispense Refill  . dexamethasone (DECADRON) 4 MG tablet Take 4  mg by mouth as directed. 1 tab BID day before, of and after chemo       . furosemide (LASIX) 20 MG tablet Take 20 mg by mouth daily.        Marland Kitchen lisinopril (PRINIVIL,ZESTRIL) 20 MG tablet Take 20 mg by mouth daily.        Marland Kitchen amLODipine (NORVASC) 5 MG tablet Take 5 mg by mouth 2 (two) times daily.        . bisoprolol (ZEBETA) 5 MG tablet Take 2.5 mg by mouth daily.        . folic acid (FOLVITE) 1 MG tablet Take 1 mg by mouth daily.        . hydrochlorothiazide (HYDRODIURIL) 25 MG tablet Take 25 mg by mouth daily.        . naproxen sodium (ANAPROX) 220 MG tablet Take 220 mg by mouth.        . ondansetron (ZOFRAN) 8 MG tablet Take by mouth every 8 (eight) hours as needed.        . prochlorperazine (COMPAZINE) 10 MG tablet Take 10 mg by mouth every 6 (six) hours as needed.          SURGICAL HISTORY:  Past Surgical History  Procedure Date  . Portacath placement   . Hip surgery     REVIEW OF SYSTEMS:  A comprehensive review of systems was negative except for: Constitutional: positive for fatigue and SOB with exertion.  PHYSICAL EXAMINATION: General appearance: alert, cooperative and no distress Head: Normocephalic, without obvious abnormality, atraumatic Neck: no adenopathy Lymph nodes: Cervical, supraclavicular, and  axillary nodes normal. Resp: clear to auscultation bilaterally Cardio: regular rate and rhythm, S1, S2 normal, no murmur, click, rub or gallop GI: soft, non-tender; bowel sounds normal; no masses,  no organomegaly Extremities: extremities normal, atraumatic, no cyanosis or edema Neurologic: Alert and oriented X 3, normal strength and tone. Normal symmetric reflexes. Normal coordination and gait  ECOG PERFORMANCE STATUS: 1 - Symptomatic but completely ambulatory  Blood pressure 141/79, pulse 82, temperature 97.8 F (36.6 C), temperature source Oral, height 5\' 4"  (1.626 m), weight 131 lb (59.421 kg).  LABORATORY DATA: Lab Results  Component Value Date   WBC 6.6 05/22/2011    HGB 12.9 05/22/2011   HCT 38.1 05/22/2011   MCV 97.4 05/22/2011   PLT 293 05/22/2011      Chemistry      Component Value Date/Time   NA 134* 05/01/2011 1334   NA 140 11/06/2010 1002   K 3.8 05/01/2011 1334   K 5.0* 11/06/2010 1002   CL 95* 05/01/2011 1334   CL 87* 11/06/2010 1002   CO2 30 05/01/2011 1334   CO2 29 11/06/2010 1002   BUN 8 05/01/2011 1334   BUN 8 11/06/2010 1002   CREATININE 0.56 05/01/2011 1334   CREATININE 0.4* 11/06/2010 1002      Component Value Date/Time   CALCIUM 10.4 05/01/2011 1334   CALCIUM 9.7 11/06/2010 1002   ALKPHOS 93 05/01/2011 1334   ALKPHOS 83 11/06/2010 1002   AST 42* 05/01/2011 1334   AST 23 11/06/2010 1002   ALT 39* 05/01/2011 1334   BILITOT 0.5 05/01/2011 1334   BILITOT 0.90 11/06/2010 1002       RADIOGRAPHIC STUDIES: Ct Chest W Contrast  05/19/2011  *RADIOLOGY REPORT*  Clinical Data:  Follow-up lung carcinoma.  Chemotherapy in progress.  Previous radiation therapy.  Chest pain, short of breast, cough, and nausea.  CT CHEST, ABDOMEN AND PELVIS WITH CONTRAST  Technique:  Multidetector CT imaging of the chest, abdomen and pelvis was performed following the standard protocol during bolus administration of intravenous contrast.  Contrast: OMNIPAQUE IOHEXOL 300 MG/ML IV SOLN  Comparison:  03/14/2011  CT CHEST  Findings:  Moderate pulmonary emphysema again demonstrated. Bilateral perihilar scarring is stable in appearance.  7 x 8 mm spiculated nodule in the medial right lung apex is stable.  Other tiny less than 5 mm bilateral pulmonary nodules are also unchanged. No new or enlarging pulmonary nodules or masses are identified. There is no evidence of acute pulmonary infiltrate.  No evidence of central endobronchial obstruction.  Mild cardiomegaly is stable.  There is no evidence of pleural or pericardial effusion.  No evidence of hilar or mediastinal lymphadenopathy.  No evidence of axillary lymphadenopathy or chest wall mass.  IMPRESSION:  1.  Stable bilateral  pulmonary nodules, largest in the right lung apex measuring 8 mm. 2.  Stable pulmonary emphysema and bilateral perihilar scarring. 3.  No new or progressive disease identified within the thorax.  CT ABDOMEN AND PELVIS  Findings:  A few tiny less than 1 cm low attenuation lesions are again seen in the liver, which remain too small to characterize. No new or enlarging liver lesions are identified.  Bilateral low attenuation adrenal masses are unchanged, consistent with benign adenomas.  The spleen, pancreas, and kidneys are unremarkable except for a few tiny less than 1 cm renal cysts.  No evidence of renal mass or hydronephrosis.  A benign appearing cyst in the left adnexa measuring 3.2 x 3.8 cm shows no significant change.  The no other masses or lymphadenopathy identified within the pelvis.  There is no evidence of abdominal or pelvic inflammatory process.  No abnormal fluid collections are identified.  Ectasia atherosclerotic calcification of the abdominal aorta and iliac arteries is stable.  Left hip dynamic screw fixation again noted.  No suspicious bone lesions are identified.  IMPRESSION: Stable exam.  No definite evidence of metastatic disease or other acute findings within the abdomen or pelvis.  Original Report Authenticated By: Danae Orleans, M.D.   Ct Abdomen Pelvis W Contrast  05/19/2011  *RADIOLOGY REPORT*  Clinical Data:  Follow-up lung carcinoma.  Chemotherapy in progress.  Previous radiation therapy.  Chest pain, short of breast, cough, and nausea.  CT CHEST, ABDOMEN AND PELVIS WITH CONTRAST  Technique:  Multidetector CT imaging of the chest, abdomen and pelvis was performed following the standard protocol during bolus administration of intravenous contrast.  Contrast: OMNIPAQUE IOHEXOL 300 MG/ML IV SOLN  Comparison:  03/14/2011  CT CHEST  Findings:  Moderate pulmonary emphysema again demonstrated. Bilateral perihilar scarring is stable in appearance.  7 x 8 mm spiculated nodule in the  medial right lung apex is stable.  Other tiny less than 5 mm bilateral pulmonary nodules are also unchanged. No new or enlarging pulmonary nodules or masses are identified. There is no evidence of acute pulmonary infiltrate.  No evidence of central endobronchial obstruction.  Mild cardiomegaly is stable.  There is no evidence of pleural or pericardial effusion.  No evidence of hilar or mediastinal lymphadenopathy.  No evidence of axillary lymphadenopathy or chest wall mass.  IMPRESSION:  1.  Stable bilateral pulmonary nodules, largest in the right lung apex measuring 8 mm. 2.  Stable pulmonary emphysema and bilateral perihilar scarring. 3.  No new or progressive disease identified within the thorax.  CT ABDOMEN AND PELVIS  Findings:  A few tiny less than 1 cm low attenuation lesions are again seen in the liver, which remain too small to characterize. No new or enlarging liver lesions are identified.  Bilateral low attenuation adrenal masses are unchanged, consistent with benign adenomas.  The spleen, pancreas, and kidneys are unremarkable except for a few tiny less than 1 cm renal cysts.  No evidence of renal mass or hydronephrosis.  A benign appearing cyst in the left adnexa measuring 3.2 x 3.8 cm shows no significant change.  The no other masses or lymphadenopathy identified within the pelvis.  There is no evidence of abdominal or pelvic inflammatory process.  No abnormal fluid collections are identified.  Ectasia atherosclerotic calcification of the abdominal aorta and iliac arteries is stable.  Left hip dynamic screw fixation again noted.  No suspicious bone lesions are identified.  IMPRESSION: Stable exam.  No definite evidence of metastatic disease or other acute findings within the abdomen or pelvis.  Original Report Authenticated By: Danae Orleans, M.D.    ASSESSMENT: This is a pleasant 70 years old white female with metastatic non-small cell lung cancer most recently treated with maintenance Alimta  status post 9 cycles. The patient tolerated her treatment fairly well except for fatigue and occasional nausea. Her scan results showed no evidence for disease progression I discussed the scan with he patient and her niece. I recommended for her to continue her maintenance treatment with Alimta but the patient would like to take a break during the holiday.   PLAN: I would see her back for followup visit in 3 months with repeat CT scan of the chest abdomen and pelvis.  All questions were answered. The patient knows to call the clinic with any problems, questions or concerns. We can certainly see the patient much sooner if necessary.

## 2011-06-12 ENCOUNTER — Ambulatory Visit (HOSPITAL_BASED_OUTPATIENT_CLINIC_OR_DEPARTMENT_OTHER): Payer: No Typology Code available for payment source

## 2011-06-12 DIAGNOSIS — C3410 Malignant neoplasm of upper lobe, unspecified bronchus or lung: Secondary | ICD-10-CM

## 2011-06-12 DIAGNOSIS — Z452 Encounter for adjustment and management of vascular access device: Secondary | ICD-10-CM

## 2011-06-12 DIAGNOSIS — C3490 Malignant neoplasm of unspecified part of unspecified bronchus or lung: Secondary | ICD-10-CM

## 2011-06-12 MED ORDER — HEPARIN SOD (PORK) LOCK FLUSH 100 UNIT/ML IV SOLN
500.0000 [IU] | Freq: Once | INTRAVENOUS | Status: AC
  Administered 2011-06-12: 500 [IU] via INTRAVENOUS
  Filled 2011-06-12: qty 5

## 2011-06-12 MED ORDER — SODIUM CHLORIDE 0.9 % IJ SOLN
10.0000 mL | INTRAMUSCULAR | Status: DC | PRN
  Administered 2011-06-12: 10 mL via INTRAVENOUS
  Filled 2011-06-12: qty 10

## 2011-06-12 NOTE — Patient Instructions (Signed)
Call MD for problems 

## 2011-07-24 ENCOUNTER — Ambulatory Visit: Payer: No Typology Code available for payment source

## 2011-07-24 MED ORDER — HEPARIN SOD (PORK) LOCK FLUSH 100 UNIT/ML IV SOLN
500.0000 [IU] | Freq: Once | INTRAVENOUS | Status: AC
  Filled 2011-07-24: qty 5

## 2011-07-24 MED ORDER — SODIUM CHLORIDE 0.9 % IJ SOLN
10.0000 mL | INTRAMUSCULAR | Status: AC | PRN
  Filled 2011-07-24: qty 10

## 2011-08-21 ENCOUNTER — Other Ambulatory Visit (HOSPITAL_BASED_OUTPATIENT_CLINIC_OR_DEPARTMENT_OTHER): Payer: No Typology Code available for payment source | Admitting: Lab

## 2011-08-21 ENCOUNTER — Ambulatory Visit (HOSPITAL_COMMUNITY)
Admission: RE | Admit: 2011-08-21 | Discharge: 2011-08-21 | Disposition: A | Discharge status: Home / Self Care | Payer: No Typology Code available for payment source | Source: Ambulatory Visit | Attending: Internal Medicine | Admitting: Internal Medicine

## 2011-08-21 DIAGNOSIS — C3410 Malignant neoplasm of upper lobe, unspecified bronchus or lung: Secondary | ICD-10-CM

## 2011-08-21 DIAGNOSIS — C3490 Malignant neoplasm of unspecified part of unspecified bronchus or lung: Secondary | ICD-10-CM | POA: Insufficient documentation

## 2011-08-21 DIAGNOSIS — N9489 Other specified conditions associated with female genital organs and menstrual cycle: Secondary | ICD-10-CM | POA: Insufficient documentation

## 2011-08-21 DIAGNOSIS — M949 Disorder of cartilage, unspecified: Secondary | ICD-10-CM | POA: Insufficient documentation

## 2011-08-21 DIAGNOSIS — M479 Spondylosis, unspecified: Secondary | ICD-10-CM | POA: Insufficient documentation

## 2011-08-21 DIAGNOSIS — R918 Other nonspecific abnormal finding of lung field: Secondary | ICD-10-CM | POA: Insufficient documentation

## 2011-08-21 DIAGNOSIS — M899 Disorder of bone, unspecified: Secondary | ICD-10-CM | POA: Insufficient documentation

## 2011-08-21 DIAGNOSIS — M4120 Other idiopathic scoliosis, site unspecified: Secondary | ICD-10-CM | POA: Insufficient documentation

## 2011-08-21 LAB — CBC WITH DIFFERENTIAL/PLATELET
Basophils Absolute: 0 10*3/uL (ref 0.0–0.1)
EOS%: 3.6 % (ref 0.0–7.0)
HCT: 41.8 % (ref 34.8–46.6)
HGB: 14.3 g/dL (ref 11.6–15.9)
MCH: 31.4 pg (ref 25.1–34.0)
MCV: 91.9 fL (ref 79.5–101.0)
MONO%: 7.4 % (ref 0.0–14.0)
NEUT%: 61.6 % (ref 38.4–76.8)
Platelets: 238 10*3/uL (ref 145–400)

## 2011-08-21 LAB — CMP (CANCER CENTER ONLY)
AST: 23 U/L (ref 11–38)
Alkaline Phosphatase: 108 U/L — ABNORMAL HIGH (ref 26–84)
BUN, Bld: 14 mg/dL (ref 7–22)
Calcium: 9.7 mg/dL (ref 8.0–10.3)
Chloride: 94 mEq/L — ABNORMAL LOW (ref 98–108)
Creat: 0.5 mg/dl — ABNORMAL LOW (ref 0.6–1.2)

## 2011-08-21 MED ORDER — IOHEXOL 300 MG/ML  SOLN
100.0000 mL | Freq: Once | INTRAMUSCULAR | Status: AC | PRN
  Administered 2011-08-21: 100 mL via INTRAVENOUS

## 2011-08-27 ENCOUNTER — Ambulatory Visit (HOSPITAL_BASED_OUTPATIENT_CLINIC_OR_DEPARTMENT_OTHER): Payer: Medicaid Other | Admitting: Internal Medicine

## 2011-08-27 ENCOUNTER — Telehealth: Payer: Self-pay | Admitting: Internal Medicine

## 2011-08-27 VITALS — BP 168/78 | HR 76 | Temp 96.5°F | Ht 64.0 in | Wt 122.7 lb

## 2011-08-27 DIAGNOSIS — C3490 Malignant neoplasm of unspecified part of unspecified bronchus or lung: Secondary | ICD-10-CM

## 2011-08-27 DIAGNOSIS — F17200 Nicotine dependence, unspecified, uncomplicated: Secondary | ICD-10-CM

## 2011-08-27 NOTE — Telephone Encounter (Signed)
Gave pt, calendar for May 2013, lab, ct and MD. Jovita Gamma pt oral contrast, NPO 4 hrs prior to CT.

## 2011-08-27 NOTE — Progress Notes (Signed)
[facility] [phone]   [phone]  OFFICE PROGRESS NOTE  Wendy Puffer, MD, MD [address] [place] [address]  DIAGNOSIS: Recurrent non-small cell lung cancer, adenocarcinoma, initially diagnosed with synchronous stage I involving the right upper lobe and left upper lobe in January 2007.   PRIOR THERAPY:  1. Status post bilateral stereotactic radiotherapy under the care of Dr. Abelardo Diesel at [facility] completed Nov 25, 2005. 2. Status post 6 cycles of systemic chemotherapy with carboplatin and paclitaxel for disease recurrence. Last dose given August 27, 2006 discontinued as the patient had disease stabilization. 3. Status post 6 more cycles of systemic chemotherapy for disease progression with carboplatin and paclitaxel, last dose given July 22, 2010 when the patient had disease stabilization and the treatment was discontinued at that time. 4. Systemic chemotherapy with Alimta at 500 mg/m2 given every 3 weeks status post 9 cycles.  CURRENT THERAPY: Observation.   INTERVAL HISTORY: [name] 71 y.o. female returns to the clinic today for 4 month followup visit. The patient is feeling fine today with no specific complaints. She denied having any significant chest pain or shortness of breath, no cough or hemoptysis. She denied having any weight loss or night sweats. Unfortunately she continues to smoke.  The patient has repeat CT scan of the chest, abdomen and pelvis performed recently and she is here today for evaluation and discussion of her scan results.  MEDICAL HISTORY: Past Medical History  Diagnosis Date  . Hypertension   . Cancer     lung ca  . Heart murmur   . Lung cancer   . Hip fracture, left   . COPD (chronic obstructive pulmonary disease)   . Skin cancer     ALLERGIES:  is allergic to other.  MEDICATIONS:  Current Outpatient Prescriptions  Medication Sig Dispense Refill  . amLODipine (NORVASC) 5  MG tablet Take 5 mg by mouth 2 (two) times daily.        . bisoprolol (ZEBETA) 5 MG tablet Take 2.5 mg by mouth daily.        . folic acid (FOLVITE) 1 MG tablet Take 1 mg by mouth daily.        . furosemide (LASIX) 20 MG tablet Take 20 mg by mouth daily.        Marland Kitchen lisinopril (PRINIVIL,ZESTRIL) 20 MG tablet Take 20 mg by mouth daily.        . naproxen sodium (ANAPROX) 220 MG tablet Take 220 mg by mouth.        . ondansetron (ZOFRAN) 8 MG tablet Take by mouth every 8 (eight) hours as needed.        . prochlorperazine (COMPAZINE) 10 MG tablet Take 10 mg by mouth every 6 (six) hours as needed.        Marland Kitchen dexamethasone (DECADRON) 4 MG tablet Take 4 mg by mouth as directed. 1 tab BID day before, of and after chemo        No current facility-administered medications for this visit.   Facility-Administered Medications Ordered in Other Visits  Medication Dose Route Frequency Provider Last Rate Last Dose  . heparin lock flush 100 unit/mL  500 Units Intravenous Once Merary Garguilo K. Caysen Whang, MD      . sodium chloride 0.9 % injection 10 mL  10 mL Intravenous PRN Pamelyn Bancroft K. Arbutus Ped, MD        SURGICAL HISTORY:  Past Surgical History  Procedure Date  . Portacath  placement   . Hip surgery     REVIEW OF SYSTEMS:  A comprehensive review of systems was negative.   PHYSICAL EXAMINATION: General appearance: alert, cooperative and no distress Neck: no adenopathy Lymph nodes: Cervical, supraclavicular, and axillary nodes normal. Resp: clear to auscultation bilaterally Cardio: regular rate and rhythm, S1, S2 normal, no murmur, click, rub or gallop GI: soft, non-tender; bowel sounds normal; no masses,  no organomegaly Extremities: extremities normal, atraumatic, no cyanosis or edema Neurologic: Alert and oriented X 3, normal strength and tone. Normal symmetric reflexes. Normal coordination and gait  ECOG PERFORMANCE STATUS: 1 - Symptomatic but completely ambulatory  Blood pressure 168/78, pulse 76, temperature  96.5 F (35.8 C), temperature source Oral, height 5\' 4"  (1.626 m), weight 122 lb 11.2 oz (55.656 kg).  LABORATORY DATA: Lab Results  Component Value Date   WBC 5.1 08/21/2011   HGB 14.3 08/21/2011   HCT 41.8 08/21/2011   MCV 91.9 08/21/2011   PLT 238 08/21/2011      Chemistry      Component Value Date/Time   NA 144 08/21/2011 1131   NA 134* 05/01/2011 1334   K 5.1* 08/21/2011 1131   K 3.8 05/01/2011 1334   CL 94* 08/21/2011 1131   CL 95* 05/01/2011 1334   CO2 33 08/21/2011 1131   CO2 30 05/01/2011 1334   BUN 14 08/21/2011 1131   BUN 8 05/01/2011 1334   CREATININE 0.5* 08/21/2011 1131   CREATININE 0.56 05/01/2011 1334      Component Value Date/Time   CALCIUM 9.7 08/21/2011 1131   CALCIUM 10.4 05/01/2011 1334   ALKPHOS 108* 08/21/2011 1131   ALKPHOS 93 05/01/2011 1334   AST 23 08/21/2011 1131   AST 42* 05/01/2011 1334   ALT 39* 05/01/2011 1334   BILITOT 0.70 08/21/2011 1131   BILITOT 0.5 05/01/2011 1334       RADIOGRAPHIC STUDIES: Ct Chest W Contrast  08/21/2011  *RADIOLOGY REPORT*  Clinical Data:  Restaging lung cancer  CT CHEST, ABDOMEN AND PELVIS WITH CONTRAST  Technique:  Multidetector CT imaging of the chest, abdomen and pelvis was performed following the standard protocol during bolus administration of intravenous contrast.  Contrast: OMNIPAQUE IOHEXOL 300 MG/ML IV SOLN  Comparison:  05/19/2011  CT CHEST  Findings:  There are no enlarged supraclavicular or axillary lymph nodes.  No enlarged mediastinal or hilar lymph nodes identified.  There is no pericardial or pleural effusion identified.  Pulmonary nodule within the right upper lobe measures 8.4 mm, image 15.  Stable from previous exam.  Pulmonary nodule in the right lower lobe measures 5.3 mm, image 29. Previously 4.7 mm.  Within the left upper lobe there is a nodule which measures 4.6 mm, image number 21.  New from previous exam.  Subpleural nodule within the superior segment of the left lower lobe measures 4.2 mm, image  21.  Also new from previous exam.  Tiny nodule within the left upper lobe measures 2.7 mm, image 31. This is stable from previous exam.  Anterior right upper lobe nodule measures 3.8 mm, image 32.  Stable from previous exam.  The bones appear diffusely osteopenic.  Mild multilevel spondylosis noted.  IMPRESSION:  1.  There are two small nodules within the left lung which appear new from previous exam.  These measure up to 4.6 mm.  These are indeterminate and attention on follow-up examination is warranted. 2.  Other small pulmonary nodules are stable. 3.  Similar appearance of bilateral perihilar scarring  CT ABDOMEN AND PELVIS  Findings:  Tiny low density structures within the liver parenchyma appears stable from previous exam, image number 57, 60 and 65.  Gallbladder appears normal.  No biliary ductal dilatation.  The pancreas appears normal.  The spleen appears normal.  Low density enlargement of bilateral adrenal glands is again noted. Likely multiple benign adenomas.  Stable cyst in the right kidney.  Left kidney is negative.  No upper abdominal adenopathy.  There is no pelvic or inguinal adenopathy.  The urinary bladder is normal.  Left adnexal cyst measures 3.9 x 3.0 cm, image number 94.  Stable from previous examination.  Review of the visualized osseous structures shows a medullary rod and screw device within the left proximal femur.  The bones appear osteopenic.  There is a scoliosis deformity and multilevel spondylosis identified.  IMPRESSION:  1.  Stable exam.  No specific evidence to suggest metastatic disease.  Original Report Authenticated By: Rosealee Albee, M.D.   Ct Abdomen Pelvis W Contrast  08/21/2011  *RADIOLOGY REPORT*  Clinical Data:  Restaging lung cancer  CT CHEST, ABDOMEN AND PELVIS WITH CONTRAST  Technique:  Multidetector CT imaging of the chest, abdomen and pelvis was performed following the standard protocol during bolus administration of intravenous contrast.  Contrast:  OMNIPAQUE IOHEXOL 300 MG/ML IV SOLN  Comparison:  05/19/2011  CT CHEST  Findings:  There are no enlarged supraclavicular or axillary lymph nodes.  No enlarged mediastinal or hilar lymph nodes identified.  There is no pericardial or pleural effusion identified.  Pulmonary nodule within the right upper lobe measures 8.4 mm, image 15.  Stable from previous exam.  Pulmonary nodule in the right lower lobe measures 5.3 mm, image 29. Previously 4.7 mm.  Within the left upper lobe there is a nodule which measures 4.6 mm, image number 21.  New from previous exam.  Subpleural nodule within the superior segment of the left lower lobe measures 4.2 mm, image 21.  Also new from previous exam.  Tiny nodule within the left upper lobe measures 2.7 mm, image 31. This is stable from previous exam.  Anterior right upper lobe nodule measures 3.8 mm, image 32.  Stable from previous exam.  The bones appear diffusely osteopenic.  Mild multilevel spondylosis noted.  IMPRESSION:  1.  There are two small nodules within the left lung which appear new from previous exam.  These measure up to 4.6 mm.  These are indeterminate and attention on follow-up examination is warranted. 2.  Other small pulmonary nodules are stable. 3.  Similar appearance of bilateral perihilar scarring  CT ABDOMEN AND PELVIS  Findings:  Tiny low density structures within the liver parenchyma appears stable from previous exam, image number 57, 60 and 65.  Gallbladder appears normal.  No biliary ductal dilatation.  The pancreas appears normal.  The spleen appears normal.  Low density enlargement of bilateral adrenal glands is again noted. Likely multiple benign adenomas.  Stable cyst in the right kidney.  Left kidney is negative.  No upper abdominal adenopathy.  There is no pelvic or inguinal adenopathy.  The urinary bladder is normal.  Left adnexal cyst measures 3.9 x 3.0 cm, image number 94.  Stable from previous examination.  Review of the visualized osseous structures  shows a medullary rod and screw device within the left proximal femur.  The bones appear osteopenic.  There is a scoliosis deformity and multilevel spondylosis identified.  IMPRESSION:  1.  Stable exam.  No specific evidence to suggest  metastatic disease.  Original Report Authenticated By: Rosealee Albee, M.D.    ASSESSMENT: This is a very pleasant 71 years old white female with recurrent non-small cell lung cancer, adenocarcinoma status post several chemotherapy regimens. She is currently on observation with no evidence for disease progression. I discussed the scan results with the patient today.  PLAN: I recommended for her to continue on observation with repeat CT scan of the chest, abdomen and pelvis in 3 months. She would come back for followup visit at that time. I again strongly encouraged the patient to quit smoking. She was advised to call me immediately if she has any concerning symptoms in the interval  All questions were answered. The patient knows to call the clinic with any problems, questions or concerns. We can certainly see the patient much sooner if necessary.

## 2011-10-02 ENCOUNTER — Ambulatory Visit
Admission: RE | Admit: 2011-10-02 | Discharge: 2011-10-02 | Disposition: A | Discharge status: Home / Self Care | Payer: No Typology Code available for payment source | Source: Ambulatory Visit | Attending: Physician Assistant | Admitting: Physician Assistant

## 2011-10-02 ENCOUNTER — Other Ambulatory Visit: Payer: Self-pay | Admitting: Physician Assistant

## 2011-10-02 DIAGNOSIS — M25551 Pain in right hip: Secondary | ICD-10-CM

## 2011-10-28 ENCOUNTER — Other Ambulatory Visit: Payer: Self-pay | Admitting: Physician Assistant

## 2011-10-28 DIAGNOSIS — R52 Pain, unspecified: Secondary | ICD-10-CM

## 2011-11-01 ENCOUNTER — Inpatient Hospital Stay: Admission: RE | Admit: 2011-11-01 | Payer: Self-pay | Source: Ambulatory Visit

## 2011-11-01 ENCOUNTER — Other Ambulatory Visit: Payer: Self-pay | Admitting: Physician Assistant

## 2011-11-01 ENCOUNTER — Ambulatory Visit
Admission: RE | Admit: 2011-11-01 | Discharge: 2011-11-01 | Disposition: A | Discharge status: Home / Self Care | Payer: No Typology Code available for payment source | Source: Ambulatory Visit | Attending: Physician Assistant | Admitting: Physician Assistant

## 2011-11-01 ENCOUNTER — Other Ambulatory Visit: Payer: Self-pay

## 2011-11-01 DIAGNOSIS — M25559 Pain in unspecified hip: Secondary | ICD-10-CM

## 2011-11-01 DIAGNOSIS — M545 Low back pain: Secondary | ICD-10-CM

## 2011-11-01 DIAGNOSIS — C801 Malignant (primary) neoplasm, unspecified: Secondary | ICD-10-CM

## 2011-11-01 DIAGNOSIS — M419 Scoliosis, unspecified: Secondary | ICD-10-CM

## 2011-11-01 HISTORY — DX: Scoliosis, unspecified: M41.9

## 2011-11-01 HISTORY — DX: Malignant (primary) neoplasm, unspecified: C80.1

## 2011-11-04 ENCOUNTER — Other Ambulatory Visit: Payer: Self-pay | Admitting: *Deleted

## 2011-11-04 DIAGNOSIS — C4490 Unspecified malignant neoplasm of skin, unspecified: Secondary | ICD-10-CM | POA: Insufficient documentation

## 2011-11-04 DIAGNOSIS — C3490 Malignant neoplasm of unspecified part of unspecified bronchus or lung: Secondary | ICD-10-CM | POA: Insufficient documentation

## 2011-11-04 DIAGNOSIS — C801 Malignant (primary) neoplasm, unspecified: Secondary | ICD-10-CM | POA: Insufficient documentation

## 2011-11-04 DIAGNOSIS — J449 Chronic obstructive pulmonary disease, unspecified: Secondary | ICD-10-CM | POA: Insufficient documentation

## 2011-11-04 NOTE — Progress Notes (Signed)
Pt called stating that she had an MRI hip and spine done and was told she has new mets on her femur causing pain.  Per Dr Donnald Garre, pt needs referral to Dr Roselind Messier to be evaluated.  Referral made, left msg with patient to call back.  SLJ

## 2011-11-05 ENCOUNTER — Ambulatory Visit
Admission: RE | Admit: 2011-11-05 | Discharge: 2011-11-05 | Disposition: A | Discharge status: Home / Self Care | Payer: No Typology Code available for payment source | Source: Ambulatory Visit | Attending: Radiation Oncology | Admitting: Radiation Oncology

## 2011-11-05 ENCOUNTER — Encounter: Payer: Self-pay | Admitting: Radiation Oncology

## 2011-11-05 ENCOUNTER — Encounter: Payer: Self-pay | Admitting: *Deleted

## 2011-11-05 ENCOUNTER — Ambulatory Visit: Payer: No Typology Code available for payment source

## 2011-11-05 ENCOUNTER — Ambulatory Visit
Admission: RE | Admit: 2011-11-05 | Payer: No Typology Code available for payment source | Source: Ambulatory Visit | Admitting: Radiation Oncology

## 2011-11-05 VITALS — BP 174/94 | HR 83 | Temp 98.1°F | Wt 124.1 lb

## 2011-11-05 DIAGNOSIS — Z510 Encounter for antineoplastic radiation therapy: Secondary | ICD-10-CM | POA: Insufficient documentation

## 2011-11-05 DIAGNOSIS — C7952 Secondary malignant neoplasm of bone marrow: Secondary | ICD-10-CM

## 2011-11-05 DIAGNOSIS — Z86711 Personal history of pulmonary embolism: Secondary | ICD-10-CM | POA: Insufficient documentation

## 2011-11-05 DIAGNOSIS — Z79899 Other long term (current) drug therapy: Secondary | ICD-10-CM | POA: Insufficient documentation

## 2011-11-05 DIAGNOSIS — Z8673 Personal history of transient ischemic attack (TIA), and cerebral infarction without residual deficits: Secondary | ICD-10-CM | POA: Insufficient documentation

## 2011-11-05 DIAGNOSIS — C7951 Secondary malignant neoplasm of bone: Secondary | ICD-10-CM | POA: Insufficient documentation

## 2011-11-05 DIAGNOSIS — C3490 Malignant neoplasm of unspecified part of unspecified bronchus or lung: Secondary | ICD-10-CM

## 2011-11-05 NOTE — Progress Notes (Signed)
Encounter addended by: Lowella Petties, RN on: 11/05/2011  4:14 PM<BR>     Documentation filed: Visit Diagnoses

## 2011-11-05 NOTE — Progress Notes (Signed)
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

## 2011-11-05 NOTE — Progress Notes (Signed)
Please see the Nurse Progress Note in the MD Initial Consult Encounter for this patient. 

## 2011-11-05 NOTE — Progress Notes (Signed)
Radiation Oncology         [phone] ________________________________  Name: [name] MRN: [id number]  Date: 11/05/2011  DOB: January 30, 1941  Follow-Up Visit Note  CC: [name], MD, MD  Si Gaul, MD  Diagnosis: Recurrent and metastatic non-small lung cancer  Interval Since Last Radiation:  50 months  Narrative:  The patient returns today for  follow-up  at the request of Dr. Shirline Frees. The patient has been having pain on the lower back and right pelvis area as well as right upper leg. She was seen by the Dr. Lonie Peak and MRI of the lumbar spine and right pelvis area ordered. These findings are documented below. Given the presence of osseous metastasis and associated pain region radiation therapy been consulted for consideration for treatment.                              ALLERGIES:  is allergic to other.  Meds: Current Outpatient Prescriptions  Medication Sig Dispense Refill  . bisoprolol (ZEBETA) 5 MG tablet Take 2.5 mg by mouth daily.        . folic acid (FOLVITE) 1 MG tablet Take 1 mg by mouth daily.        . furosemide (LASIX) 20 MG tablet Take 20 mg by mouth daily. Take 1 once or twice prn edema      . lisinopril (PRINIVIL,ZESTRIL) 20 MG tablet Take 20 mg by mouth daily.        . naproxen sodium (ANAPROX) 220 MG tablet Take 220 mg by mouth.        . ondansetron (ZOFRAN) 8 MG tablet Take by mouth every 8 (eight) hours as needed.        . prochlorperazine (COMPAZINE) 10 MG tablet Take 10 mg by mouth every 6 (six) hours as needed.        Marland Kitchen amLODipine (NORVASC) 5 MG tablet Take 5 mg by mouth 2 (two) times daily.        Marland Kitchen dexamethasone (DECADRON) 4 MG tablet Take 4 mg by mouth as directed. 1 tab BID day before, of and after chemo        No current facility-administered medications for this encounter.   Facility-Administered Medications Ordered in Other Encounters  Medication Dose Route Frequency Provider Last Rate Last Dose  . heparin lock flush 100 unit/mL   500 Units Intravenous Once Si Gaul, MD      . sodium chloride 0.9 % injection 10 mL  10 mL Intravenous PRN Si Gaul, MD        Physical Findings: The patient is in no acute distress. Patient is alert and oriented.  weight is 124 lb 1.6 oz (56.291 kg). Her temperature is 98.1 F (36.7 C). Her blood pressure is 174/94 and her pulse is 83. Marland Kitchen  No palpable cervical or supraclavicular adenopathy. The lungs are clear to auscultation. The heart has regular rhythm and rate. The abdomen is soft and nontender with normal bowel sounds. The patient is ambulating with the assistance of a cane. Motor strength is 5 out of 5 in proximal and distal muscle groups of the lower extremities. Palpation along the right pelvis there is some tenderness along the right iliac crest as well as the right greater trochanter area. Peripheral pulses appear to be adequate in the lower extremities.  Lab Findings: Lab Results  Component Value Date   WBC 5.1 08/21/2011   HGB 14.3 08/21/2011   HCT 41.8  08/21/2011   MCV 91.9 08/21/2011   PLT 238 08/21/2011    @LASTCHEM @  Radiographic Findings: Mr Lumbar Spine Wo Contrast  11/01/2011  *RADIOLOGY REPORT*  Clinical Data: Low back, right hip and groin pain for 2 months. History of lung cancer.  MRI LUMBAR SPINE WITHOUT CONTRAST  Technique:  Multiplanar and multiecho pulse sequences of the lumbar spine were obtained without intravenous contrast.  Comparison: Abdominal pelvic CT 08/21/2011.  PET CT 12/28/2009.  Findings: CT demonstrates five lumbar type vertebral bodies.  There is a moderate convex left scoliosis centered at L3.  There is no evidence of acute fracture or pars defect.  No definite metastases are seen within the lumbar spine.  A small lesion at L2 is likely a hemangioma.  There is a large lytic metastasis involving the right iliac bone and extending to the sacroiliac joint.  This is incompletely visualized by this examination.  It is better seen and further  described on the MRI of the hip performed today and dictated separately.  The conus medullaris extends to the L1 level and appears normal. There are no paraspinal abnormalities. Aortoiliac atherosclerosis and bilateral adrenal adenomas are unchanged. Cystic in the left adnexa is unchanged.  T12 - L1:  A right paracentral disc protrusion is only imaged in the sagittal plane and appears similar to the most recent CT. There is no cord deformity or foraminal compromise.  L1-L2: Mild disc bulging.  No significant spinal stenosis or nerve root encroachment.  L2-L3:  Mild disc bulging.  No significant spinal stenosis or nerve root encroachment.  L3-L4:  Disc bulging and facet hypertrophy are asymmetric to the right.  There is mild narrowing of the right lateral recess.  No foraminal compromise or nerve root encroachment is seen.  L4-L5:  Disc bulging and facet hypertrophy are asymmetric to the left.  There is stable mild narrowing of the left lateral recess. No foraminal compromise or nerve root encroachment is seen.  L5-S1:  Disc bulging and facet hypertrophy are asymmetric to the left.  Mild narrowing of the left foramen is stable.  There is no nerve root encroachment.  IMPRESSION:  1.  Stable mild lumbar spondylosis associated with a scoliosis.  No high-grade spinal stenosis or nerve root encroachment identified.  2.  Large lytic metastasis involving the right iliac bone.  This is further described on separate report of the right hip. 3.  No metastases are identified within the lumbar spine.  Original Report Authenticated By: Gerrianne Scale, M.D.   Mr Hip Right Wo Contrast  11/01/2011  *RADIOLOGY REPORT*  Clinical Data: Low back, right hip and groin pain for 2 months. History of lung cancer.  MRI OF THE RIGHT HIP WITHOUT CONTRAST  Technique:  Multiplanar, multisequence MR imaging was performed. No intravenous contrast was administered.  Comparison: Hip radiographs 10/02/2011.  Abdominal pelvic CT 08/21/2011.   Findings: There is a large lytic metastasis involving the intertrochanteric region of the right femur.  This measures at least 5.2 cm cephalocaudad and is associated with a cortical break anteriorly and anterior extension of tumor into the soft tissues. There is surrounding soft tissue edema and a small right hip joint effusion.  There is a large lytic metastasis involving the right iliac bone superiorly adjacent to the sacroiliac joint.  This measures at least 5.6 cm cephalocaudad and is associated with cortical breakthrough laterally.  There is a small metastasis involving the left iliac bone.  This is best seen on coronal T1 image #9.  There is no definite sacral lesion.  The sacroiliac joints are intact.  The patient is status post left proximal femoral ORIF.  A 4.6 cm cystic left adnexal lesion is unchanged from prior CTs. There is no pelvic or inguinal lymphadenopathy.  IMPRESSION:   1.  Large lytic metastasis involving the medial intertrochanteric region of the right femur.  There is cortical breakthrough with anterior medial soft tissue extension of tumor.  This metastasis is at high risk for complete pathologic fracture. 2.  Large lytic metastasis involving the right iliac bone adjacent to the sacroiliac joint.  Smaller left iliac metastasis. 3.  Stable cystic left adnexal lesion.  Critical Value/emergent results were called by telephone at the time of interpretation on 11/01/2011  at 1725 hours  to  Dr. Anna Genre, who verbally acknowledged these results.  Original Report Authenticated By: Gerrianne Scale, M.D.      Impression:    Osseous metastasis from metastatic non-small cell lung cancer. The patient is having significant pain in the right proximal femur as well as right pelvis area and in light of this I would recommend a course of palliative radiation therapy. Prior to this endeavor however I would recommend orthopedic consultation in life the fact that the patient has a lesion in the  intertrochanteric area which would be at risk for pathologic fracture. I did speak with Dr. Francena Hanly who did her left hip surgery in the past. He has graciously agreed to see the patient as soon as possible concernong orthopedic stabilization. In the meantime I recommend the patient to ambulate with the assistance of her walker and to limit significant weightbearing on the right side.   Plan:  The patient will be seen in the postoperative period for scheduling of her treatments directed to the right proximal femur and right medial pelvis region.  _____________________________________  Billie Lade, M.D.

## 2011-11-10 ENCOUNTER — Encounter (HOSPITAL_COMMUNITY): Payer: Self-pay | Admitting: Pharmacy Technician

## 2011-11-11 ENCOUNTER — Encounter (HOSPITAL_COMMUNITY)
Admission: RE | Admit: 2011-11-11 | Discharge: 2011-11-11 | Disposition: A | Discharge status: Home / Self Care | Payer: No Typology Code available for payment source | Source: Ambulatory Visit | Attending: Orthopedic Surgery | Admitting: Orthopedic Surgery

## 2011-11-11 ENCOUNTER — Ambulatory Visit (HOSPITAL_COMMUNITY)
Admission: RE | Admit: 2011-11-11 | Discharge: 2011-11-11 | Disposition: A | Discharge status: Home / Self Care | Payer: No Typology Code available for payment source | Source: Ambulatory Visit | Attending: Anesthesiology | Admitting: Anesthesiology

## 2011-11-11 ENCOUNTER — Encounter (HOSPITAL_COMMUNITY): Payer: Self-pay

## 2011-11-11 DIAGNOSIS — Z01818 Encounter for other preprocedural examination: Secondary | ICD-10-CM | POA: Insufficient documentation

## 2011-11-11 HISTORY — DX: Anemia, unspecified: D64.9

## 2011-11-11 LAB — COMPREHENSIVE METABOLIC PANEL
AST: 18 U/L (ref 0–37)
Albumin: 3.4 g/dL — ABNORMAL LOW (ref 3.5–5.2)
Alkaline Phosphatase: 109 U/L (ref 39–117)
Chloride: 98 mEq/L (ref 96–112)
Potassium: 4 mEq/L (ref 3.5–5.1)
Sodium: 137 mEq/L (ref 135–145)
Total Bilirubin: 0.5 mg/dL (ref 0.3–1.2)
Total Protein: 6.5 g/dL (ref 6.0–8.3)

## 2011-11-11 LAB — CBC
HCT: 38.2 % (ref 36.0–46.0)
MCH: 30.3 pg (ref 26.0–34.0)
MCHC: 33.5 g/dL (ref 30.0–36.0)
MCV: 90.5 fL (ref 78.0–100.0)
Platelets: 234 10*3/uL (ref 150–400)
RDW: 13.8 % (ref 11.5–15.5)

## 2011-11-11 NOTE — Pre-Procedure Instructions (Signed)
20 [name]  11/11/2011   Your procedure is scheduled on:  MAY 9 @ 0930  Report to [facility] at 0730 AM.  Call this number if you have problems the morning of surgery: [phone]   Remember:   Do not eat food:After Midnight.  May have clear liquids: up to 4 Hours before arrival.  Clear liquids include soda, tea, black coffee, apple or grape juice, broth.  Take these medicines the morning of surgery with A SIP OF WATER: SPIRIVA,BISOPROLOL,   Do not wear jewelry, make-up or nail polish.  Do not wear lotions, powders, or perfumes. You may wear deodorant.  Do not shave 48 hours prior to surgery.  Do not bring valuables to the hospital.  Contacts, dentures or bridgework may not be worn into surgery.  Leave suitcase in the car. After surgery it may be brought to your room.  For patients admitted to the hospital, checkout time is 11:00 AM the day of discharge.   Patients discharged the day of surgery will not be allowed to drive home.  Name and phone number of your driver: NEICE  Special Instructions: CHG Shower Use Special Wash: 1/2 bottle night before surgery and 1/2 bottle morning of surgery.   Please read over the following fact sheets that you were given: Coughing and Deep Breathing, MRSA Information and Surgical Site Infection Prevention

## 2011-11-11 NOTE — Pre-Procedure Instructions (Signed)
20 Dayne  11/11/2011   Your procedure is scheduled on:  Thurs, May 9 @ 9:30 AM  Report to [facility] at 7:30 AM.  Call this number if you have problems the morning of surgery: [phone]   Remember:   Do not eat food:After Midnight.  May have clear liquids: up to 4 Hours before arrival.(until 3:30 AM)  Clear liquids include soda, tea, black coffee, apple or grape juice, broth,water  Take these medicines the morning of surgery with A SIP OF WATER: Bisoprolol and Spiriva <Bring Your Inhaler With You>   Do not wear jewelry, make-up or nail polish.  Do not wear lotions, powders, or perfumes.   Do not shave 48 hours prior to surgery.  Do not bring valuables to the hospital.  Contacts, dentures or bridgework may not be worn into surgery.  Leave suitcase in the car. After surgery it may be brought to your room.  For patients admitted to the hospital, checkout time is 11:00 AM the day of discharge.   Patients discharged the day of surgery will not be allowed to drive home.  Special Instructions: CHG Shower Use Special Wash: 1/2 bottle night before surgery and 1/2 bottle morning of surgery.   Please read over the following fact sheets that you were given: Pain Booklet, Coughing and Deep Breathing, Total Joint Packet and MRSA Information

## 2011-11-12 MED ORDER — CEFAZOLIN SODIUM-DEXTROSE 2-3 GM-% IV SOLR
2.0000 g | INTRAVENOUS | Status: DC
  Filled 2011-11-12: qty 50

## 2011-11-12 NOTE — Consult Note (Signed)
Anesthesia Chart Review:  Patient is a 71 year old female scheduled for a intramedullary nail fixation of right hip on 11/13/11 for pending pathologic right hip fracture.  PCP is Dr. [name].  Oncologist is Dr. Arbutus Ped.  History includes recurrent and metastatic NSC lung cancer with prior history of chemo and radiation, heart murmur (mild MR '06), smoking, scoliosis, lumbar spondylosis, anemia, HTN, history of left hip fx s/p repair.  EKG from 11/11/11 showed SR, first degree AVB, possible LAE, LVH.  Echo results from 05/22/05 showed: - Overall left ventricular systolic function was vigorous. Left ventricular ejection fraction was estimated , range being 65 % to 75 %.. There were no left ventricular regional wall motion abnormalities. Left ventricular wall thickness was mildly increased. There was mild asymmetric septal hypertrophy. There was mild systolic anterior motion of the mitral valve. 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 - There was mild mitral valvular regurgitation. IMPRESSIONS - SBE prophylaxis is recommended.  CXR on 11/11/11 showed: 1. Stable upper lobe perihilar scarring  2. No acute cardiopulmonary abnormalities.  Labs noted.   Plan to proceed.  Shonna Chock, PA-C

## 2011-11-13 ENCOUNTER — Inpatient Hospital Stay (HOSPITAL_COMMUNITY)
Admission: RE | Admit: 2011-11-13 | Discharge: 2011-11-14 | DRG: 481 | Disposition: A | Discharge status: Home / Self Care | Payer: No Typology Code available for payment source | Source: Ambulatory Visit | Attending: Orthopedic Surgery | Admitting: Orthopedic Surgery

## 2011-11-13 ENCOUNTER — Encounter (HOSPITAL_COMMUNITY): Payer: Self-pay | Admitting: Vascular Surgery

## 2011-11-13 ENCOUNTER — Encounter (HOSPITAL_COMMUNITY): Payer: Self-pay | Admitting: *Deleted

## 2011-11-13 ENCOUNTER — Ambulatory Visit (HOSPITAL_COMMUNITY): Payer: No Typology Code available for payment source | Admitting: Vascular Surgery

## 2011-11-13 ENCOUNTER — Ambulatory Visit (HOSPITAL_COMMUNITY): Payer: No Typology Code available for payment source

## 2011-11-13 ENCOUNTER — Encounter (HOSPITAL_COMMUNITY)
Admission: RE | Disposition: A | Discharge status: Home / Self Care | Payer: Self-pay | Source: Ambulatory Visit | Attending: Orthopedic Surgery

## 2011-11-13 DIAGNOSIS — J4489 Other specified chronic obstructive pulmonary disease: Secondary | ICD-10-CM | POA: Diagnosis present

## 2011-11-13 DIAGNOSIS — Z85828 Personal history of other malignant neoplasm of skin: Secondary | ICD-10-CM

## 2011-11-13 DIAGNOSIS — M84453A Pathological fracture, unspecified femur, initial encounter for fracture: Principal | ICD-10-CM | POA: Diagnosis present

## 2011-11-13 DIAGNOSIS — M4120 Other idiopathic scoliosis, site unspecified: Secondary | ICD-10-CM | POA: Diagnosis present

## 2011-11-13 DIAGNOSIS — C3490 Malignant neoplasm of unspecified part of unspecified bronchus or lung: Secondary | ICD-10-CM | POA: Diagnosis present

## 2011-11-13 DIAGNOSIS — F17200 Nicotine dependence, unspecified, uncomplicated: Secondary | ICD-10-CM | POA: Diagnosis present

## 2011-11-13 DIAGNOSIS — J449 Chronic obstructive pulmonary disease, unspecified: Secondary | ICD-10-CM | POA: Diagnosis present

## 2011-11-13 DIAGNOSIS — M84559A Pathological fracture in neoplastic disease, hip, unspecified, initial encounter for fracture: Secondary | ICD-10-CM | POA: Diagnosis present

## 2011-11-13 HISTORY — PX: FEMUR IM NAIL: SHX1597

## 2011-11-13 SURGERY — INSERTION, INTRAMEDULLARY ROD, FEMUR
Anesthesia: General | Site: Hip | Laterality: Right | Wound class: Clean

## 2011-11-13 MED ORDER — ENOXAPARIN SODIUM 40 MG/0.4ML ~~LOC~~ SOLN
40.0000 mg | SUBCUTANEOUS | Status: DC
  Administered 2011-11-14: 40 mg via SUBCUTANEOUS
  Filled 2011-11-13 (×2): qty 0.4

## 2011-11-13 MED ORDER — PROPOFOL 10 MG/ML IV EMUL
INTRAVENOUS | Status: DC | PRN
  Administered 2011-11-13: 100 mg via INTRAVENOUS

## 2011-11-13 MED ORDER — LORAZEPAM 2 MG/ML IJ SOLN: 1.0000 mg | Freq: Once | INTRAMUSCULAR | Status: DC | PRN

## 2011-11-13 MED ORDER — LACTATED RINGERS IV SOLN
INTRAVENOUS | Status: DC
  Administered 2011-11-13: 09:00:00 via INTRAVENOUS

## 2011-11-13 MED ORDER — ONDANSETRON HCL 4 MG PO TABS: 4.0000 mg | ORAL_TABLET | Freq: Four times a day (QID) | ORAL | Status: DC | PRN

## 2011-11-13 MED ORDER — BISOPROLOL FUMARATE 5 MG PO TABS
2.5000 mg | ORAL_TABLET | Freq: Every day | ORAL | Status: DC
  Administered 2011-11-14: 2.5 mg via ORAL
  Filled 2011-11-13: qty 0.5

## 2011-11-13 MED ORDER — HYDROCODONE-ACETAMINOPHEN 5-325 MG PO TABS
1.0000 | ORAL_TABLET | ORAL | Status: DC | PRN
  Administered 2011-11-13 – 2011-11-14 (×3): 2 via ORAL
  Filled 2011-11-13 (×3): qty 2

## 2011-11-13 MED ORDER — MENTHOL 3 MG MT LOZG: 1.0000 | LOZENGE | OROMUCOSAL | Status: DC | PRN

## 2011-11-13 MED ORDER — METHOCARBAMOL 500 MG PO TABS
500.0000 mg | ORAL_TABLET | Freq: Four times a day (QID) | ORAL | Status: DC | PRN
  Filled 2011-11-13: qty 1

## 2011-11-13 MED ORDER — CHLORHEXIDINE GLUCONATE 4 % EX LIQD: 60.0000 mL | Freq: Once | CUTANEOUS | Status: DC

## 2011-11-13 MED ORDER — TIOTROPIUM BROMIDE MONOHYDRATE 18 MCG IN CAPS
18.0000 ug | ORAL_CAPSULE | Freq: Every day | RESPIRATORY_TRACT | Status: DC
  Filled 2011-11-13 (×2): qty 5

## 2011-11-13 MED ORDER — GLYCOPYRROLATE 0.2 MG/ML IJ SOLN
INTRAMUSCULAR | Status: DC | PRN
  Administered 2011-11-13: 0.6 mg via INTRAVENOUS

## 2011-11-13 MED ORDER — DEXAMETHASONE 4 MG PO TABS: 4.0000 mg | ORAL_TABLET | ORAL | Status: DC

## 2011-11-13 MED ORDER — NEOSTIGMINE METHYLSULFATE 1 MG/ML IJ SOLN
INTRAMUSCULAR | Status: DC | PRN
  Administered 2011-11-13: 4 mg via INTRAVENOUS

## 2011-11-13 MED ORDER — ONDANSETRON HCL 4 MG/2ML IJ SOLN: 4.0000 mg | Freq: Once | INTRAMUSCULAR | Status: DC

## 2011-11-13 MED ORDER — HYDROMORPHONE HCL PF 1 MG/ML IJ SOLN
INTRAMUSCULAR | Status: AC
  Filled 2011-11-13: qty 1

## 2011-11-13 MED ORDER — METHOCARBAMOL 100 MG/ML IJ SOLN
500.0000 mg | Freq: Four times a day (QID) | INTRAMUSCULAR | Status: DC | PRN
  Administered 2011-11-13: 500 mg via INTRAVENOUS
  Filled 2011-11-13: qty 5

## 2011-11-13 MED ORDER — ONDANSETRON HCL 4 MG/2ML IJ SOLN
INTRAMUSCULAR | Status: DC | PRN
  Administered 2011-11-13: 4 mg via INTRAVENOUS

## 2011-11-13 MED ORDER — ACETAMINOPHEN 325 MG PO TABS: 650.0000 mg | ORAL_TABLET | Freq: Four times a day (QID) | ORAL | Status: DC | PRN

## 2011-11-13 MED ORDER — ALBUTEROL SULFATE HFA 108 (90 BASE) MCG/ACT IN AERS
INHALATION_SPRAY | RESPIRATORY_TRACT | Status: DC | PRN
  Administered 2011-11-13: 4 via RESPIRATORY_TRACT

## 2011-11-13 MED ORDER — METOCLOPRAMIDE HCL 10 MG PO TABS: 5.0000 mg | ORAL_TABLET | Freq: Three times a day (TID) | ORAL | Status: DC | PRN

## 2011-11-13 MED ORDER — LACTATED RINGERS IV SOLN
INTRAVENOUS | Status: DC | PRN
  Administered 2011-11-13: 10:00:00 via INTRAVENOUS

## 2011-11-13 MED ORDER — EPHEDRINE SULFATE 50 MG/ML IJ SOLN
INTRAMUSCULAR | Status: DC | PRN
  Administered 2011-11-13: 5 mg via INTRAVENOUS
  Administered 2011-11-13: 10 mg via INTRAVENOUS

## 2011-11-13 MED ORDER — HYDROMORPHONE HCL PF 1 MG/ML IJ SOLN
0.2500 mg | INTRAMUSCULAR | Status: DC | PRN
  Administered 2011-11-13 (×4): 0.5 mg via INTRAVENOUS

## 2011-11-13 MED ORDER — FUROSEMIDE 20 MG PO TABS
20.0000 mg | ORAL_TABLET | Freq: Two times a day (BID) | ORAL | Status: DC | PRN
  Filled 2011-11-13: qty 1

## 2011-11-13 MED ORDER — ONDANSETRON HCL 4 MG/2ML IJ SOLN: 4.0000 mg | Freq: Four times a day (QID) | INTRAMUSCULAR | Status: DC | PRN

## 2011-11-13 MED ORDER — 0.9 % SODIUM CHLORIDE (POUR BTL) OPTIME
TOPICAL | Status: DC | PRN
  Administered 2011-11-13: 1000 mL

## 2011-11-13 MED ORDER — ALUM & MAG HYDROXIDE-SIMETH 200-200-20 MG/5ML PO SUSP: 30.0000 mL | ORAL | Status: DC | PRN

## 2011-11-13 MED ORDER — LIDOCAINE HCL (CARDIAC) 20 MG/ML IV SOLN
INTRAVENOUS | Status: DC | PRN
  Administered 2011-11-13: 60 mg via INTRAVENOUS

## 2011-11-13 MED ORDER — LISINOPRIL 20 MG PO TABS
20.0000 mg | ORAL_TABLET | Freq: Every day | ORAL | Status: DC
  Administered 2011-11-14: 20 mg via ORAL
  Filled 2011-11-13 (×2): qty 1

## 2011-11-13 MED ORDER — LACTATED RINGERS IV SOLN: INTRAVENOUS | Status: DC

## 2011-11-13 MED ORDER — CEFAZOLIN SODIUM 1-5 GM-% IV SOLN
1.0000 g | Freq: Four times a day (QID) | INTRAVENOUS | Status: AC
  Administered 2011-11-13 – 2011-11-14 (×3): 1 g via INTRAVENOUS
  Filled 2011-11-13 (×3): qty 50

## 2011-11-13 MED ORDER — MIDAZOLAM HCL 5 MG/5ML IJ SOLN
INTRAMUSCULAR | Status: DC | PRN
  Administered 2011-11-13: 1 mg via INTRAVENOUS

## 2011-11-13 MED ORDER — CEFAZOLIN SODIUM 1-5 GM-% IV SOLN
INTRAVENOUS | Status: DC | PRN
  Administered 2011-11-13: 2 g via INTRAVENOUS

## 2011-11-13 MED ORDER — PHENOL 1.4 % MT LIQD: 1.0000 | OROMUCOSAL | Status: DC | PRN

## 2011-11-13 MED ORDER — ROCURONIUM BROMIDE 100 MG/10ML IV SOLN
INTRAVENOUS | Status: DC | PRN
  Administered 2011-11-13: 25 mg via INTRAVENOUS

## 2011-11-13 MED ORDER — METOCLOPRAMIDE HCL 5 MG/ML IJ SOLN
5.0000 mg | Freq: Three times a day (TID) | INTRAMUSCULAR | Status: DC | PRN
  Administered 2011-11-13: 10 mg via INTRAVENOUS
  Filled 2011-11-13: qty 2

## 2011-11-13 MED ORDER — FENTANYL CITRATE 0.05 MG/ML IJ SOLN: 50.0000 ug | INTRAMUSCULAR | Status: DC | PRN

## 2011-11-13 MED ORDER — MIDAZOLAM HCL 2 MG/2ML IJ SOLN: 1.0000 mg | INTRAMUSCULAR | Status: DC | PRN

## 2011-11-13 MED ORDER — ONDANSETRON HCL 4 MG/2ML IJ SOLN
INTRAMUSCULAR | Status: AC
  Administered 2011-11-13: 4 mg
  Filled 2011-11-13: qty 2

## 2011-11-13 MED ORDER — ACETAMINOPHEN 650 MG RE SUPP: 650.0000 mg | Freq: Four times a day (QID) | RECTAL | Status: DC | PRN

## 2011-11-13 MED ORDER — FENTANYL CITRATE 0.05 MG/ML IJ SOLN
INTRAMUSCULAR | Status: DC | PRN
  Administered 2011-11-13 (×4): 50 ug via INTRAVENOUS

## 2011-11-13 SURGICAL SUPPLY — 45 items
BANDAGE CONFORM 3  STR LF (GAUZE/BANDAGES/DRESSINGS) ×2 IMPLANT
BIT DRILL CANN LG 4.3MM (BIT) IMPLANT
BLADE SURG 15 STRL LF DISP TIS (BLADE) ×1 IMPLANT
BLADE SURG 15 STRL SS (BLADE) ×2
CLOTH BEACON ORANGE TIMEOUT ST (SAFETY) ×2 IMPLANT
CLSR STERI-STRIP ANTIMIC 1/2X4 (GAUZE/BANDAGES/DRESSINGS) ×1 IMPLANT
DRAPE STERI IOBAN 125X83 (DRAPES) ×2 IMPLANT
DRILL BIT CANN LG 4.3MM (BIT) ×2
DRSG ADAPTIC 3X8 NADH LF (GAUZE/BANDAGES/DRESSINGS) ×1 IMPLANT
DRSG MEPILEX BORDER 4X4 (GAUZE/BANDAGES/DRESSINGS) ×3 IMPLANT
DRSG MEPILEX BORDER 4X8 (GAUZE/BANDAGES/DRESSINGS) ×1 IMPLANT
ELECT REM PT RETURN 9FT ADLT (ELECTROSURGICAL) ×2
ELECTRODE REM PT RTRN 9FT ADLT (ELECTROSURGICAL) ×1 IMPLANT
GLOVE BIO SURGEON STRL SZ7.5 (GLOVE) ×2 IMPLANT
GLOVE BIO SURGEON STRL SZ8 (GLOVE) ×2 IMPLANT
GLOVE BIOGEL PI IND STRL 6.5 (GLOVE) IMPLANT
GLOVE BIOGEL PI INDICATOR 6.5 (GLOVE) ×1
GLOVE EUDERMIC 7 POWDERFREE (GLOVE) ×2 IMPLANT
GLOVE SS BIOGEL STRL SZ 6.5 (GLOVE) IMPLANT
GLOVE SS BIOGEL STRL SZ 7.5 (GLOVE) ×1 IMPLANT
GLOVE SUPERSENSE BIOGEL SZ 6.5 (GLOVE) ×1
GLOVE SUPERSENSE BIOGEL SZ 7.5 (GLOVE) ×1
GOWN STRL NON-REIN LRG LVL3 (GOWN DISPOSABLE) ×2 IMPLANT
GOWN STRL REIN XL XLG (GOWN DISPOSABLE) ×4 IMPLANT
GUIDEPIN 3.2X17.5 THRD DISP (PIN) ×1 IMPLANT
GUIDEWIRE BALL NOSE 80CM (WIRE) ×1 IMPLANT
KIT BASIN OR (CUSTOM PROCEDURE TRAY) ×2 IMPLANT
KIT ROOM TURNOVER OR (KITS) ×2 IMPLANT
MANIFOLD NEPTUNE II (INSTRUMENTS) ×2 IMPLANT
NAIL HIP FRACT 130D 11X180 (Screw) ×1 IMPLANT
NS IRRIG 1000ML POUR BTL (IV SOLUTION) ×2 IMPLANT
PACK GENERAL/GYN (CUSTOM PROCEDURE TRAY) ×2 IMPLANT
PAD ARMBOARD 7.5X6 YLW CONV (MISCELLANEOUS) ×4 IMPLANT
SCREW BONE CORTICAL 5.0X3 (Screw) ×1 IMPLANT
SCREW LAG HIP NAIL 10.5X95 (Screw) ×1 IMPLANT
SPONGE LAP 18X18 X RAY DECT (DISPOSABLE) ×2 IMPLANT
STAPLER VISISTAT 35W (STAPLE) ×3 IMPLANT
STRIP CLOSURE SKIN 1/2X4 (GAUZE/BANDAGES/DRESSINGS) ×1 IMPLANT
SUT MNCRL AB 3-0 PS2 18 (SUTURE) ×2 IMPLANT
SUT VIC AB 0 CT1 27 (SUTURE) ×2
SUT VIC AB 0 CT1 27XBRD ANBCTR (SUTURE) ×1 IMPLANT
SUT VIC AB 2-0 CT1 27 (SUTURE) ×4
SUT VIC AB 2-0 CT1 TAPERPNT 27 (SUTURE) ×1 IMPLANT
TRAY FOLEY CATH 14FR (SET/KITS/TRAYS/PACK) IMPLANT
WATER STERILE IRR 1000ML POUR (IV SOLUTION) ×1 IMPLANT

## 2011-11-13 NOTE — Op Note (Signed)
11/13/2011  11:08 AM  PATIENT:   [name]  71 y.o. female  PRE-OPERATIVE DIAGNOSIS:  PENDING PATHOLOGIC FRACTURE OF RIGHT HIP  POST-OPERATIVE DIAGNOSIS:  same  PROCEDURE:  Prophylactic internal fixation with IMHS  SURGEON:  Kyleigha Markert, Vania Rea M.D.  ASSISTANTS: Shuford pac   ANESTHESIA:   get  EBL: 150  SPECIMEN:  none  Drains: none   PATIENT DISPOSITION:  PACU - hemodynamically stable.    PLAN OF CARE: Admit for overnight observation WBAT RLE D/c planning for Friday Needs to F/U with Dr. Roselind Messier to discuss radiation therapy F/U my office approx 2 weeks  Dictation# [id number]

## 2011-11-13 NOTE — Anesthesia Postprocedure Evaluation (Signed)
  Anesthesia Post-op Note  Patient: [name]  Procedure(s) Performed: Procedure(s) (LRB): INTRAMEDULLARY (IM) NAIL FEMORAL (Right)  Patient Location: PACU  Anesthesia Type: General  Level of Consciousness: awake  Airway and Oxygen Therapy: Patient Spontanous Breathing  Post-op Pain: mild  Post-op Assessment: Post-op Vital signs reviewed, Patient's Cardiovascular Status Stable, Respiratory Function Stable, Patent Airway, No signs of Nausea or vomiting and Pain level controlled  Post-op Vital Signs: stable  Complications: No apparent anesthesia complications

## 2011-11-13 NOTE — Transfer of Care (Addendum)
Immediate Anesthesia Transfer of Care Note  Patient: [name]  Procedure(s) Performed: Procedure(s) (LRB): INTRAMEDULLARY (IM) NAIL FEMORAL (Right)  Patient Location: PACU  Anesthesia Type: General  Level of Consciousness: awake, alert  and oriented  Airway & Oxygen Therapy: Patient Spontanous Breathing and Patient connected to nasal cannula oxygen  Post-op Assessment: Report given to PACU RN and Post -op Vital signs reviewed and stable  Post vital signs: Reviewed and stable  Complications: No apparent anesthesia complications

## 2011-11-13 NOTE — Preoperative (Signed)
Beta Blockers   Reason not to administer Beta Blockers:Not Applicable 

## 2011-11-13 NOTE — H&P (Signed)
[name]    Chief Complaint: PENDING PATHOLOGIC FRACTURE OF RIGHT HIP HPI: The patient is a 71 y.o. female with metastatic lung CA and impending pathologic fracture right hip  Past Medical History  Diagnosis Date  . Hypertension   . Heart murmur   . Hip fracture, left   . COPD (chronic obstructive pulmonary disease)   . Skin cancer   . Lung cancer 09/18/2005    rul =[facility] dx  . Cancer 11/01/11     mets rightfemur//iliac 5.6cm,&left iliac  . Skin cancer   . 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 Scoliosis 11/01/11    l3  . History of chemotherapy     6 cycles carboplatin/paclitaxel,last dose 08/2006,again given 07/22/2010,alimta q 3weeks s/p 9 cycless  . History of radiation therapy 08/10/2007-09/07/2007    right paratracheal area/ Dr.Kinard, [place]  . History of radiation therapy 2007    bilateral sterotatic radiotherapy Dr.McMullen at [facility] completed 11/25/2005  . Anemia     Past Surgical History  Procedure Date  . Portacath placement   . Hip surgery     LEFT HIP    Family History  Problem Relation Age of Onset  . Breast cancer Paternal Grandmother   . Prostate cancer Maternal Uncle     brainand colon ca  . Heart attack Mother 49    Social History:  reports that she has been smoking Cigarettes.  She has a 25 pack-year smoking history. She does not have any smokeless tobacco history on file. She reports that she does not drink alcohol or use illicit drugs.  Allergies:  Allergies  Allergen Reactions  . Other Hives    Cheap jewelry.    Medications Prior to Admission  Medication Sig Dispense Refill  . bisoprolol (ZEBETA) 5 MG tablet Take 2.5 mg by mouth daily.       . folic acid (FOLVITE) 1 MG tablet Take 1 mg by mouth daily.        . furosemide (LASIX) 20 MG tablet Take 20 mg by mouth 2 (two) times daily as needed. For edema      . lisinopril (PRINIVIL,ZESTRIL) 20 MG tablet Take 20 mg by mouth daily.        . naproxen sodium  (ANAPROX) 220 MG tablet Take 220 mg by mouth daily as needed. For pain      . tiotropium (SPIRIVA) 18 MCG inhalation capsule Place 18 mcg into inhaler and inhale daily.      Marland Kitchen dexamethasone (DECADRON) 4 MG tablet Take 4 mg by mouth as directed. Take 1 tablet twice daily the day before,the day of and the day after chemo         Physical Exam: Right hip examined and exam unchanged from office visit 5/3 with mild diffuse tenderness and pain with palpation  Vitals  Temp:  [97.8 F (36.6 C)] 97.8 F (36.6 C) (05/09 0745) Pulse Rate:  [76] 76  (05/09 0745) Resp:  [18] 18  (05/09 0745) BP: (181)/(90) 181/90 mmHg (05/09 0745) SpO2:  [95 %] 95 % (05/09 0745)  Assessment/Plan  Impression: PENDING PATHOLOGIC FRACTURE OF RIGHT HIP  Plan of Action: Procedure(s): INTRAMEDULLARY (IM) NAIL FEMORAL  [name] M 11/13/2011, 9:28 AM

## 2011-11-13 NOTE — Anesthesia Preprocedure Evaluation (Addendum)
Anesthesia Evaluation  Patient identified by MRN, date of birth, ID band Patient awake    Reviewed: Allergy & Precautions, H&P , NPO status , Patient's Chart, lab work & pertinent test results  History of Anesthesia Complications Negative for: history of anesthetic complications  Airway Mallampati: I TM Distance: >3 FB Neck ROM: Full    Dental  (+) Dental Advisory Given, Missing and Poor Dentition,    Pulmonary shortness of breath and with exertion, COPDCurrent Smoker,  Lung ca + rhonchi         Cardiovascular hypertension, Pt. on home beta blockers + Valvular Problems/Murmurs     Neuro/Psych negative neurological ROS  negative psych ROS   GI/Hepatic negative GI ROS, Neg liver ROS,   Endo/Other  negative endocrine ROS  Renal/GU negative Renal ROS  negative genitourinary   Musculoskeletal negative musculoskeletal ROS (+)   Abdominal   Peds negative pediatric ROS (+)  Hematology negative hematology ROS (+)   Anesthesia Other Findings   Reproductive/Obstetrics negative OB ROS                         Anesthesia Physical Anesthesia Plan  ASA: III  Anesthesia Plan: General   Post-op Pain Management:    Induction: Intravenous  Airway Management Planned: Oral ETT  Additional Equipment:   Intra-op Plan:   Post-operative Plan: Extubation in OR and Possible Post-op intubation/ventilation  Informed Consent: I have reviewed the patients History and Physical, chart, labs and discussed the procedure including the risks, benefits and alternatives for the proposed anesthesia with the patient or authorized representative who has indicated his/her understanding and acceptance.     Plan Discussed with: CRNA and Surgeon  Anesthesia Plan Comments:         Anesthesia Quick Evaluation

## 2011-11-14 DIAGNOSIS — M84559A Pathological fracture in neoplastic disease, hip, unspecified, initial encounter for fracture: Secondary | ICD-10-CM | POA: Diagnosis present

## 2011-11-14 LAB — CBC
Hemoglobin: 11.3 g/dL — ABNORMAL LOW (ref 12.0–15.0)
MCH: 30.7 pg (ref 26.0–34.0)
MCV: 92.7 fL (ref 78.0–100.0)
Platelets: 218 10*3/uL (ref 150–400)
RBC: 3.68 MIL/uL — ABNORMAL LOW (ref 3.87–5.11)

## 2011-11-14 MED ORDER — HYDROCODONE-ACETAMINOPHEN 5-325 MG PO TABS: 1.0000 | ORAL_TABLET | ORAL | Status: AC | PRN

## 2011-11-14 MED ORDER — DIAZEPAM 5 MG PO TABS: 5.0000 mg | ORAL_TABLET | Freq: Four times a day (QID) | ORAL | Status: AC | PRN

## 2011-11-14 NOTE — Evaluation (Signed)
Occupational Therapy Evaluation Patient Details Name: [name] MRN: [id number] DOB: 11-03-1940 Today's Date: 11/14/2011 Time: 1020-1050 OT Time Calculation (min): 30 min  OT Assessment / Plan / Recommendation Clinical Impression  Pt. presents with  pathologic right hip fracture s/p pinning and increased pain. Pt. will benefit from skilled OT to increase functional independence with ADLs and get pt. to supervision level at D/C.    OT Assessment  Patient needs continued OT Services    Follow Up Recommendations  Home health OT;Supervision - Intermittent       Equipment Recommendations  None recommended by OT       Frequency  Min 2X/week    Precautions / Restrictions Precautions Precautions: Fall Restrictions Weight Bearing Restrictions: Yes RLE Weight Bearing: Weight bearing as tolerated   Pertinent Vitals/Pain SpO2 76% on RA with mobility. RN made aware. O2 back up to 96% on 2L O2 via [place] with seated rest break.     ADL  Eating/Feeding: Simulated;Independent Where Assessed - Eating/Feeding: Chair Grooming: Wash/dry hands;Wash/dry face;Set up;Performed Where Assessed - Grooming: Standing at sink Upper Body Bathing: Simulated;Set up Where Assessed - Upper Body Bathing: Sitting, bed Lower Body Bathing: Simulated;Minimal assistance Where Assessed - Lower Body Bathing: Sit to stand from bed Upper Body Dressing: Set up;Performed Where Assessed - Upper Body Dressing: Sitting, bed Lower Body Dressing: Simulated;Set up Where Assessed - Lower Body Dressing: Sitting, chair Toilet Transfer: Minimal assistance;Performed Toilet Transfer Method: Ambulating Toilet Transfer Equipment: Bedside commode Toileting - Clothing Manipulation: Performed;Minimal assistance Where Assessed - Toileting Clothing Manipulation: Sit to stand from 3-in-1 or toilet Toileting - Hygiene: Performed;Set up Where Assessed - Toileting Hygiene: Sit to stand from 3-in-1 or toilet Tub/Shower Transfer:  Not assessed Tub/Shower Transfer Method: Not assessed Equipment Used: Rolling walker Ambulation Related to ADLs: Pt. min guard assist with use of RW ~25' ADL Comments: Pt. educated on safety at home with ADLs due to decreased strength and high risk for falls.     OT Diagnosis: Generalized weakness  OT Problem List: Decreased strength;Decreased activity tolerance;Impaired balance (sitting and/or standing);Decreased safety awareness;Decreased knowledge of use of DME or AE;Decreased knowledge of precautions;Cardiopulmonary status limiting activity;Pain OT Treatment Interventions: Self-care/ADL training;DME and/or AE instruction;Therapeutic activities;Patient/family education;Balance training   OT Goals Acute Rehab OT Goals OT Goal Formulation: With patient Time For Goal Achievement: 11/14/11 Potential to Achieve Goals: Good ADL Goals Pt Will Perform Lower Body Bathing: with supervision;with set-up;Sit to stand from chair ADL Goal: Lower Body Bathing - Progress: Goal set today Pt Will Perform Lower Body Dressing: with set-up;with supervision;Sit to stand from chair ADL Goal: Lower Body Dressing - Progress: Goal set today Pt Will Transfer to Toilet: with supervision;with DME;Ambulation;3-in-1 ADL Goal: Toilet Transfer - Progress: Goal set today Pt Will Perform Toileting - Hygiene: with set-up;Sit to stand from 3-in-1/toilet ADL Goal: Toileting - Hygiene - Progress: Goal set today  Visit Information  Last OT Received On: 11/14/11 Assistance Needed: +1 PT/OT Co-Evaluation/Treatment: Yes    Subjective Data  Subjective: "I have people to help me at home" Patient Stated Goal: go home   Prior Functioning  Home Living Lives With: Family Available Help at Discharge: Family (Spoke with niece on phone, who reports 24/7 assist available) Type of Home: Mobile home Home Access: Ramped entrance Home Layout: One level Bathroom Shower/Tub: Health visitor: Standard Home Adaptive  Equipment: Environmental consultant - rolling Prior Function Level of Independence: Independent with assistive device(s) (RW) Able to Take Stairs?: Yes Driving: Yes Vocation: Retired Musician:  No difficulties    Cognition  Overall Cognitive Status: Appears within functional limits for tasks assessed/performed Arousal/Alertness: Awake/alert Orientation Level: Oriented X4 / Intact Behavior During Session: [facility] for tasks performed    Extremity/Trunk Assessment Right Upper Extremity Assessment RUE ROM/Strength/Tone: Within functional levels RUE Sensation: WFL - Light Touch RUE Coordination: WFL - gross/fine motor Left Upper Extremity Assessment LUE ROM/Strength/Tone: Within functional levels LUE Sensation: WFL - Light Touch LUE Coordination: WFL - gross/fine motor Right Lower Extremity Assessment RLE ROM/Strength/Tone: Deficits;Due to pain RLE ROM/Strength/Tone Deficits: 2/5 throughout. RLE Sensation: WFL - Light Touch RLE Coordination: WFL - gross/fine motor Left Lower Extremity Assessment LLE ROM/Strength/Tone: Within functional levels LLE Sensation: WFL - Light Touch LLE Coordination: WFL - gross/fine motor Trunk Assessment Trunk Assessment: Normal   Mobility Bed Mobility Bed Mobility: Supine to Sit Supine to Sit: 4: Min assist Details for Bed Mobility Assistance: Assist for right LE due to pain with cues for sequence. Transfers Transfers: Sit to Stand;Stand to Sit Sit to Stand: 4: Min assist;With upper extremity assist;From bed;From chair/3-in-1 Stand to Sit: 4: Min assist;With upper extremity assist;To chair/3-in-1 Details for Transfer Assistance: Assist for balance with cues for tall posture and hand placement.      Balance Balance Balance Assessed: No  End of Session OT - End of Session Equipment Utilized During Treatment: Gait belt Activity Tolerance: Patient tolerated treatment well Patient left: in chair;with call bell/phone within reach Nurse Communication:  Mobility status;Other (comment) (Decreased SpO2 on RA)   Nereyda Bowler, OTR/L Pager [phone] 11/14/2011, 12:43 PM

## 2011-11-14 NOTE — Progress Notes (Signed)
UR COMPLETED  

## 2011-11-14 NOTE — Discharge Instructions (Signed)
You may shower on day 5. Change dressings as needed You may put full weight on right leg. One aspirin a day for 2 weeks

## 2011-11-14 NOTE — Discharge Summary (Signed)
  PATIENT ID:      [name]  MRN:     [id number] DOB/AGE:    71-23-1942 / 71 y.o.     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 Hypertension   . Heart murmur   . Hip fracture, left   . COPD (chronic obstructive pulmonary disease)   . Skin cancer   . Lung cancer 09/18/2005    rul =[facility] dx  . Cancer 11/01/11     mets rightfemur//iliac 5.6cm,&left iliac  . Skin cancer   . 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 Scoliosis 11/01/11    l3  . History of chemotherapy     6 cycles carboplatin/paclitaxel,last dose 08/2006,again given 07/22/2010,alimta q 3weeks s/p 9 cycless  . History of radiation therapy 08/10/2007-09/07/2007    right paratracheal area/ Dr.Kinard, [place]  . History of radiation therapy 2007    bilateral sterotatic radiotherapy Dr.McMullen at [facility] completed 11/25/2005  . Anemia     PROCEDURE: Procedure(s): INTRAMEDULLARY (IM) NAIL FEMORAL on 11/13/2011  CONSULTS:     HISTORY:  See H&P in chart  HOSPITAL COURSE:  [name] is a 70 y.o. admitted on 11/13/2011 and found to have a diagnosis of PENDING PATHOLOGIC FRACTURE OF RIGHT HIP.  After appropriate laboratory studies were obtained  they were taken to the operating room on 11/13/2011 and underwent Procedure(s): INTRAMEDULLARY (IM) NAIL FEMORAL.   They were given perioperative antibiotics:  Anti-infectives     Start     Dose/Rate Route Frequency Ordered Stop   11/13/11 1700   ceFAZolin (ANCEF) IVPB 1 g/50 mL premix        1 g 100 mL/hr over 30 Minutes Intravenous Every 6 hours 11/13/11 1625 11/14/11 0655   11/12/11 1410   ceFAZolin (ANCEF) IVPB  2 g/50 mL premix  Status:  Discontinued        2 g 100 mL/hr over 30 Minutes Intravenous 60 min pre-op 11/12/11 1410 11/13/11 1559        . Blood products given:none  Pt was very comfortable and medically and orthopedically stable on POD 1. Her thigh was soft and incisions with old scant drainage. The remainder of the hospital course was dedicated to ambulation and strengthening.   The patient was discharged on 1 Day Post-Op in  Stable condition.

## 2011-11-14 NOTE — Progress Notes (Signed)
Physical Therapy Evaluation Patient Details Name: [name] MRN: [id number] DOB: 08-Jan-1941 Today's Date: 11/14/2011 Time: 1914-7829 PT Time Calculation (min): 27 min  PT Assessment / Plan / Recommendation Clinical Impression  Pt is a 71 y/o female admitted with a pathologic right hip fracture s/p pinning along with the below PT problem list.  Pt would benefit from acute PT to maximize independence and facilitate d/c home with HHPT and assistance by family.    PT Assessment  Patient needs continued PT services    Follow Up Recommendations  Home health PT;Supervision/Assistance - 24 hour    Barriers to Discharge None      lEquipment Recommendations  None recommended by PT    Recommendations for Other Services     Frequency Min 5X/week    Precautions / Restrictions Precautions Precautions: Fall Restrictions Weight Bearing Restrictions: Yes RLE Weight Bearing: Weight bearing as tolerated    Pertinent Vitals/Pain 7/10 in right hip.  Pt repositioned.      Mobility  Bed Mobility Bed Mobility: Supine to Sit Supine to Sit: 4: Min assist Details for Bed Mobility Assistance: Assist for right LE due to pain with cues for sequence. Transfers Transfers: Sit to Stand;Stand to Sit (2 trials.) Sit to Stand: 4: Min assist;With upper extremity assist;From bed;From chair/3-in-1 Stand to Sit: 4: Min assist;With upper extremity assist;To chair/3-in-1 Details for Transfer Assistance: Assist for balance with cues for tall posture and hand placement. Ambulation/Gait Ambulation/Gait Assistance: 4: Min assist Ambulation Distance (Feet): 45 Feet (5 feet and 40 feet.) Assistive device: Rolling walker Ambulation/Gait Assistance Details: Assist for balance with cues for safety with RW.  Pt able to progress to min (guard) quickly with gait.  Distance limited by decreased O2 sats to 76% on RA with gait.  RN made aware.  O2 back up to 96% on 2L O2 via [place] with seated rest break. Gait Pattern:  Step-to pattern;Decreased step length - right;Decreased stance time - right Stairs: No Wheelchair Mobility Wheelchair Mobility: No    Exercises     PT Diagnosis: Difficulty walking;Acute pain  PT Problem List: Decreased strength;Decreased activity tolerance;Decreased balance;Decreased mobility;Decreased knowledge of use of DME;Pain PT Treatment Interventions: DME instruction;Gait training;Functional mobility training;Therapeutic activities;Balance training;Patient/family education   PT Goals Acute Rehab PT Goals PT Goal Formulation: With patient Time For Goal Achievement: 11/21/11 Potential to Achieve Goals: Good Pt will go Supine/Side to Sit: with modified independence PT Goal: Supine/Side to Sit - Progress: Goal set today Pt will go Sit to Supine/Side: with modified independence PT Goal: Sit to Supine/Side - Progress: Goal set today Pt will go Sit to Stand: with modified independence PT Goal: Sit to Stand - Progress: Goal set today Pt will go Stand to Sit: with modified independence PT Goal: Stand to Sit - Progress: Goal set today Pt will Ambulate: 51 - 150 feet;with modified independence;with least restrictive assistive device PT Goal: Ambulate - Progress: Goal set today  Visit Information  Last PT Received On: 11/14/11 Assistance Needed: +1 PT/OT Co-Evaluation/Treatment: Yes    Subjective Data  Subjective: "I suppose my niece could help me." Patient Stated Goal: Go home.   Prior Functioning  Home Living Lives With: Family Available Help at Discharge: Family (Spoke with niece on phone, who reports 24/7 assist available) Type of Home: Mobile home Home Access: Ramped entrance Home Layout: One level Bathroom Shower/Tub: Health visitor: Standard Home Adaptive Equipment: Walker - rolling Prior Function Level of Independence: Independent with assistive device(s) (RW) Able to Take Stairs?:  Yes Driving: Yes Vocation: Retired Musician:  No difficulties    Cognition  Overall Cognitive Status: Appears within functional limits for tasks assessed/performed Arousal/Alertness: Awake/alert Orientation Level: Oriented X4 / Intact Behavior During Session: WFL for tasks performed    Extremity/Trunk Assessment Right Upper Extremity Assessment RUE ROM/Strength/Tone: Within functional levels RUE Sensation: WFL - Light Touch RUE Coordination: WFL - gross/fine motor Left Upper Extremity Assessment LUE ROM/Strength/Tone: Within functional levels LUE Sensation: WFL - Light Touch LUE Coordination: WFL - gross/fine motor Right Lower Extremity Assessment RLE ROM/Strength/Tone: Deficits;Due to pain RLE ROM/Strength/Tone Deficits: 2/5 throughout. RLE Sensation: WFL - Light Touch RLE Coordination: WFL - gross/fine motor Left Lower Extremity Assessment LLE ROM/Strength/Tone: Within functional levels LLE Sensation: WFL - Light Touch LLE Coordination: WFL - gross/fine motor Trunk Assessment Trunk Assessment: Normal   Balance Balance Balance Assessed: No  End of Session PT - End of Session Equipment Utilized During Treatment: Gait belt Activity Tolerance: Patient tolerated treatment well Patient left: in chair;with call bell/phone within reach Nurse Communication: Mobility status;Other (comment) (Decreased O2 sats on RA with ambulation.)   Cephus Shelling 11/14/2011, 11:53 AM  11/14/2011 Cephus Shelling, PT, DPT [phone]

## 2011-11-14 NOTE — Op Note (Signed)
NAMEJEMEKA, [name]              ACCOUNT NO.:  [id number]  MEDICAL RECORD NO.:  [id number]  LOCATION:  5007                         FACILITY:  [facility]  PHYSICIAN:  Vania Rea. [place] Churchill, M.D.  DATE OF BIRTH:  01/10/1941  DATE OF PROCEDURE:  11/13/2011 DATE OF DISCHARGE:                              OPERATIVE REPORT   PREOPERATIVE DIAGNOSIS:  Impending pathologic fracture of the right hip.  POSTOPERATIVE DIAGNOSIS:  Impending pathologic fracture of the right hip.  PROCEDURE:  Prophylactic internal fixation of the right hip utilizing an intramedullary hip screw.  SURGEON:  Vania Rea. Jeniyah Menor, MD  ASSISTANT:  Lucita Lora. Shuford, PA-C  ANESTHESIA:  General endotracheal.  ESTIMATED BLOOD LOSS:  150 mL.  DRAINS:  None.  SPECIMENS:  None.  HISTORY:  [name] is a 71 year old female with a known history of metastatic lung CA, widely disseminated with onset of right hip pain with MRI scan evidence of a metastatic lesion in the intertrochanteric region eroding into the lesser trochanter.  She is brought to the operating at this time for planned prophylactic internal fixation of her right hip.  Preoperatively, I counseled [name] and the family on treatment options as well as risks versus benefits thereof.  Possible surgical complications were reviewed including the potential for bleeding, infection, neurovascular injury, progression to fracture, DVT, PE, as well as persistent pain and possible need for additional surgery.  They understand and accept and agreed with the planned procedure.  PROCEDURE IN DETAIL:  After undergoing routine preop evaluation, the patient received prophylactic antibiotics and on her hospital bed in the operating room, underwent smooth induction of a general endotracheal anesthesia.  She was then transferred to the fracture table in supine position and appropriately padded and protected.  The left leg was placed in a well-leg holder, the right leg into gentle  longitudinal traction.  Fluoroscopic imaging was then obtained confirming overall alignment of the hip.  The right hip girdle region was then sterilely prepped and draped in standard fashion.  Time-out was called.  A 4-cm longitudinal incision was then made over the lateral aspect of the right hip just proximal to the trochanter, and dissection was carried down through skin, subcu, and deep fascia.  The starting awl was then directed to the tip of the greater trochanter and proper positioning was confirmed fluoroscopically.  The starting awl was introduced into the proximal femur and then a guidewire was directed down the femoral canal. Position was confirmed fluoroscopically.  We then passed the starter reamer over the guidewire and then passed the 11-mm standard length into medullary hip screw over the guidewire, seated to the appropriate depth and then using the outrigger guide, made a stab wound in the lateral thigh and introduced the guide for the lag screw, which confirmed position on fluoroscopy and passed a guidewire up into the femoral neck and head.  Drilled, placed a 95-mm lag screw and fixed this in the dynamic locking position.  We then used the outrigger guide and placed the standard distal locking screw, 34 mm in length.  Final images were obtained showing good alignment and good position of the hardware. Wounds were irrigated, closed with 2-0 Vicryl in  the subcu layer and intracuticular 3-0 Monocryl for the skin, followed by Steri-Strips.  Dry dressings were applied.  The patient was then placed supine on the fracture table, transferred to the hospital bed, extubated, and taken to recovery room in stable condition.     Vania Rea. Azarius Lambson, M.D.     KMS/MEDQ  D:  11/13/2011  T:  11/14/2011  Job:  [id number]

## 2011-11-17 ENCOUNTER — Encounter (HOSPITAL_COMMUNITY): Payer: Self-pay | Admitting: Orthopedic Surgery

## 2011-11-24 ENCOUNTER — Ambulatory Visit: Payer: No Typology Code available for payment source | Admitting: Internal Medicine

## 2011-11-26 ENCOUNTER — Other Ambulatory Visit (HOSPITAL_BASED_OUTPATIENT_CLINIC_OR_DEPARTMENT_OTHER): Payer: No Typology Code available for payment source

## 2011-11-26 ENCOUNTER — Ambulatory Visit (HOSPITAL_COMMUNITY)
Admission: RE | Admit: 2011-11-26 | Discharge: 2011-11-26 | Disposition: A | Discharge status: Home / Self Care | Payer: No Typology Code available for payment source | Source: Ambulatory Visit | Attending: Internal Medicine | Admitting: Internal Medicine

## 2011-11-26 DIAGNOSIS — R109 Unspecified abdominal pain: Secondary | ICD-10-CM | POA: Insufficient documentation

## 2011-11-26 DIAGNOSIS — N9489 Other specified conditions associated with female genital organs and menstrual cycle: Secondary | ICD-10-CM | POA: Insufficient documentation

## 2011-11-26 DIAGNOSIS — M899 Disorder of bone, unspecified: Secondary | ICD-10-CM | POA: Insufficient documentation

## 2011-11-26 DIAGNOSIS — I2510 Atherosclerotic heart disease of native coronary artery without angina pectoris: Secondary | ICD-10-CM | POA: Insufficient documentation

## 2011-11-26 DIAGNOSIS — C3490 Malignant neoplasm of unspecified part of unspecified bronchus or lung: Secondary | ICD-10-CM

## 2011-11-26 DIAGNOSIS — M810 Age-related osteoporosis without current pathological fracture: Secondary | ICD-10-CM | POA: Insufficient documentation

## 2011-11-26 DIAGNOSIS — D3500 Benign neoplasm of unspecified adrenal gland: Secondary | ICD-10-CM | POA: Insufficient documentation

## 2011-11-26 DIAGNOSIS — C3410 Malignant neoplasm of upper lobe, unspecified bronchus or lung: Secondary | ICD-10-CM

## 2011-11-26 DIAGNOSIS — R110 Nausea: Secondary | ICD-10-CM | POA: Insufficient documentation

## 2011-11-26 DIAGNOSIS — R918 Other nonspecific abnormal finding of lung field: Secondary | ICD-10-CM | POA: Insufficient documentation

## 2011-11-26 DIAGNOSIS — M949 Disorder of cartilage, unspecified: Secondary | ICD-10-CM | POA: Insufficient documentation

## 2011-11-26 LAB — CBC WITH DIFFERENTIAL/PLATELET
Eosinophils Absolute: 0.2 10*3/uL (ref 0.0–0.5)
HCT: 35.6 % (ref 34.8–46.6)
LYMPH%: 6.4 % — ABNORMAL LOW (ref 14.0–49.7)
MONO#: 0.6 10*3/uL (ref 0.1–0.9)
NEUT#: 7.5 10*3/uL — ABNORMAL HIGH (ref 1.5–6.5)
NEUT%: 84.1 % — ABNORMAL HIGH (ref 38.4–76.8)
Platelets: 288 10*3/uL (ref 145–400)
WBC: 8.9 10*3/uL (ref 3.9–10.3)
nRBC: 0 % (ref 0–0)

## 2011-11-26 LAB — COMPREHENSIVE METABOLIC PANEL
CO2: 42 mEq/L — ABNORMAL HIGH (ref 19–32)
Creatinine, Ser: 0.6 mg/dL (ref 0.50–1.10)
Glucose, Bld: 115 mg/dL — ABNORMAL HIGH (ref 70–99)
Total Bilirubin: 0.9 mg/dL (ref 0.3–1.2)

## 2011-11-27 ENCOUNTER — Telehealth: Payer: Self-pay | Admitting: Medical Oncology

## 2011-11-27 ENCOUNTER — Other Ambulatory Visit: Payer: No Typology Code available for payment source | Admitting: Lab

## 2011-11-27 ENCOUNTER — Telehealth: Payer: Self-pay | Admitting: Internal Medicine

## 2011-11-27 ENCOUNTER — Ambulatory Visit (HOSPITAL_BASED_OUTPATIENT_CLINIC_OR_DEPARTMENT_OTHER): Payer: No Typology Code available for payment source | Admitting: Internal Medicine

## 2011-11-27 VITALS — BP 123/72 | HR 120 | Temp 98.1°F

## 2011-11-27 DIAGNOSIS — C3490 Malignant neoplasm of unspecified part of unspecified bronchus or lung: Secondary | ICD-10-CM

## 2011-11-27 DIAGNOSIS — C3410 Malignant neoplasm of upper lobe, unspecified bronchus or lung: Secondary | ICD-10-CM

## 2011-11-27 DIAGNOSIS — C7951 Secondary malignant neoplasm of bone: Secondary | ICD-10-CM

## 2011-11-27 NOTE — Progress Notes (Signed)
[facility] [phone]   [phone]  OFFICE PROGRESS NOTE  Wendy Puffer, MD, MD [address] [place] [address]  DIAGNOSIS: Recurrent non-small cell lung cancer, adenocarcinoma, initially diagnosed with synchronous stage I involving the right upper lobe and left upper lobe in January 2007.   PRIOR THERAPY:  1. Status post bilateral stereotactic radiotherapy under the care of Dr. Abelardo Diesel at [facility] completed Nov 25, 2005. 2. Status post 6 cycles of systemic chemotherapy with carboplatin and paclitaxel for disease recurrence. Last dose given August 27, 2006 discontinued as the patient had disease stabilization. 3. Status post 6 more cycles of systemic chemotherapy for disease progression with carboplatin and paclitaxel, last dose given July 22, 2010 when the patient had disease stabilization and the treatment was discontinued at that time. 4. Systemic chemotherapy with Alimta at 500 mg/m2 given every 3 weeks status post 9 cycles.  CURRENT THERAPY: Observation.   INTERVAL HISTORY: [name] 71 y.o. female returns to the clinic today for followup visit accompanied by her niece. The patient underwent prophylactic internal fixation of the right hip on 11/13/2011 for impending pathologic fracture of the right hip. The patient continues to have fatigue and weakness.  She denied having any significant chest pain but continues to have shortness of breath with exertion. The patient is undergoing palliative radiotherapy to the right hip. She has repeat CT scan of the chest, abdomen and pelvis performed recently and she is here today for evaluation and discussion of her scan results.  MEDICAL HISTORY: Past Medical History  Diagnosis Date  . Hypertension   . Heart murmur   . Hip fracture, left   . COPD (chronic obstructive pulmonary disease)   . Skin cancer   . Lung cancer 09/18/2005    rul =[facility] dx  . Cancer 11/01/11   mets rightfemur//iliac 5.6cm,&left iliac  . Skin cancer   . 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 Scoliosis 11/01/11    l3  . History of chemotherapy     6 cycles carboplatin/paclitaxel,last dose 08/2006,again given 07/22/2010,alimta q 3weeks s/p 9 cycless  . History of radiation therapy 08/10/2007-09/07/2007    right paratracheal area/ Dr.Kinard, [place]  . History of radiation therapy 2007    bilateral sterotatic radiotherapy Dr[facility]McMullen at [facility] completed 11/25/2005  . Anemia     ALLERGIES:  is allergic to other.  MEDICATIONS:  Current Outpatient Prescriptions  Medication Sig Dispense Refill  . bisoprolol (ZEBETA) 5 MG tablet Take 2.5 mg by mouth daily.       Marland Kitchen dexamethasone (DECADRON) 4 MG tablet Take 4 mg by mouth as directed. Take 1 tablet twice daily the day before,the day of and the day after chemo      . folic acid (FOLVITE) 1 MG tablet Take 1 mg by mouth daily.        . furosemide (LASIX) 20 MG tablet Take 20 mg by mouth 2 (two) times daily as needed. For edema      . naproxen sodium (ANAPROX) 220 MG tablet Take 220 mg by mouth daily as needed. For pain      . oxyCODONE-acetaminophen (PERCOCET) 5-325 MG per tablet Take 1 tablet by mouth every 4 (four) hours as needed. take 1-2 every 4-6hrs      . tiotropium (SPIRIVA) 18 MCG inhalation capsule Place 18 mcg into inhaler and inhale daily.      Marland Kitchen lisinopril (PRINIVIL,ZESTRIL) 20  MG tablet Take 20 mg by mouth daily.         No current facility-administered medications for this visit.   Facility-Administered Medications Ordered in Other Visits  Medication Dose Route Frequency Provider Last Rate Last Dose  . heparin lock flush 100 unit/mL  500 Units Intravenous Once Si Gaul, MD      . sodium chloride 0.9 % injection 10 mL  10 mL Intravenous PRN Si Gaul, MD        SURGICAL HISTORY:  Past Surgical History  Procedure Date  . Portacath placement   . Hip surgery     LEFT HIP  . Femur im  nail 11/13/2011    Procedure: INTRAMEDULLARY (IM) NAIL FEMORAL;  Surgeon: Senaida Lange, MD;  Location: [facility] OR;  Service: Orthopedics;  Laterality: Right;  IM NAILING OF RIGHT HIP    REVIEW OF SYSTEMS:  A comprehensive review of systems was negative except for: Constitutional: positive for fatigue Respiratory: positive for cough and dyspnea on exertion Musculoskeletal: positive for bone pain   PHYSICAL EXAMINATION: General appearance: alert, cooperative, fatigued and no distress Neck: no adenopathy Lymph nodes: Cervical, supraclavicular, and axillary nodes normal. Resp: clear to auscultation bilaterally Cardio: regular rate and rhythm, S1, S2 normal, no murmur, click, rub or gallop GI: soft, non-tender; bowel sounds normal; no masses,  no organomegaly Extremities: extremities normal, atraumatic, no cyanosis or edema Neurologic: Alert and oriented X 3, normal strength and tone. Normal symmetric reflexes. Normal coordination and gait  ECOG PERFORMANCE STATUS: 1 - Symptomatic but completely ambulatory  Blood pressure 123/72, pulse 120, temperature 98.1 F (36.7 C), temperature source Oral.  LABORATORY DATA: Lab Results  Component Value Date   WBC 8.9 11/26/2011   HGB 11.7 11/26/2011   HCT 35.6 11/26/2011   MCV 93.8 11/26/2011   PLT 288 11/26/2011      Chemistry      Component Value Date/Time   NA 141 11/26/2011 0950   NA 144 08/21/2011 1131   K 3.6 11/26/2011 0950   K 5.1* 08/21/2011 1131   CL 88* 11/26/2011 0950   CL 94* 08/21/2011 1131   CO2 42* 11/26/2011 0950   CO2 33 08/21/2011 1131   BUN 6 11/26/2011 0950   BUN 14 08/21/2011 1131   CREATININE 0.60 11/26/2011 0950   CREATININE 0.5* 08/21/2011 1131      Component Value Date/Time   CALCIUM 9.4 11/26/2011 0950   CALCIUM 9.7 08/21/2011 1131   ALKPHOS 105 11/26/2011 0950   ALKPHOS 108* 08/21/2011 1131   AST 19 11/26/2011 0950   AST 23 08/21/2011 1131   ALT 13 11/26/2011 0950   BILITOT 0.9 11/26/2011 0950   BILITOT 0.70 08/21/2011 1131        RADIOGRAPHIC STUDIES: Ct Abdomen Pelvis Wo Contrast  11/26/2011  *RADIOLOGY REPORT*  Clinical Data:  History of lung cancer.  Abdominal pain and nausea.  CT CHEST, ABDOMEN AND PELVIS WITHOUT CONTRAST  Technique:  Multidetector CT imaging of the chest, abdomen and pelvis was performed following the standard protocol without IV contrast.  Comparison:  Prior CT scan 08/21/2011.  CT CHEST  Findings:  The chest wall is unremarkable and stable.  The right- sided Port-A-Cath is stable.  No breast masses, supraclavicular or axillary adenopathy.  The thyroid gland appears normal.  Dense vascular calcifications.  The bony thorax is intact.  Stable osteoporosis.  The heart is mildly enlarged but stable.  No pericardial effusion. Stable dense aortic and coronary artery calcifications.  No mediastinal or  hilar lymphadenopathy.  The esophagus is grossly normal.  Stable emphysematous changes in the lungs.  Stable radiation changes involving the paramediastinal lungs. There are new and enlarging bilateral pulmonary nodules consistent with metastatic pulmonary disease.  No pleural effusions or pulmonary edema.  IMPRESSION:  1. New and enlarging pulmonary nodules consistent with pulmonary metastatic disease. 2.  Stable radiation changes. 3.  Stable emphysematous changes.  CT ABDOMEN AND PELVIS  Findings:  The liver is grossly normal without contrast.  No obvious metastatic lesions.  No biliary dilatation.  The gallbladder is slightly contracted.  The pancreas is normal.  The spleen is normal in size.  No focal lesions.  Stable bilateral adrenal gland masses consistent with benign adenomas.  No renal lesions.  The stomach, duodenum, small bowel and colon are grossly normal. No mesenteric or retroperitoneal masses or adenopathy.  Stable small scattered lymph nodes.  The aorta demonstrates advanced atherosclerotic calcifications as to the branch vessels.  A remote chronic distal aortic dissection is noted.  There is a stable  left adnexal cysts with benign CT imaging features.  No pelvic mass, adenopathy or free pelvic fluid collections.  No inguinal mass or hernia.  Examination of the bony structures demonstrates an enlarging lytic lesion involving the right iliac bone with cortical breakthrough. There is also a destructive lytic lesion involving the right femoral neck with associated soft tissue mass.  Subsequent placement of a femoral rod.  The lumbar vertebral bodies are intact.  IMPRESSION:  1.  Stable bilateral adrenal gland adenomas. 2.  Stable left adnexal cyst. 3.  Progressive lytic bone lesion involving the right ilium with associated destructive bony change. 4.  Interval placement of a femoral rod across the pathologic lesion near the lesser trochanter. 5.  No CT findings for metastatic hepatic disease or abdominal/pelvic adenopathy.  Original Report Authenticated By: P. Loralie Champagne, M.D.   Dg Chest 2 View  11/11/2011  *RADIOLOGY REPORT*  Clinical Data: Preop radiograph.  CHEST - 2 VIEW  Comparison: 08/21/2011  Findings: There is a right chest wall porta-catheter with tip in the cavoatrial junction.  Heart size is normal.  Chronic, bilateral upper lobe opacities perihilar opacities are identified and appear similar to previous exam.  No superimposed acute airspace consolidation identified.  No pleural effusion or edema.  There is a marked scoliosis deformity involving the thoracic and lumbar spine.  IMPRESSION:  1.  Stable upper lobe perihilar scarring 2.  No acute cardiopulmonary abnormalities  Original Report Authenticated By: Rosealee Albee, M.D.   Dg Hip Operative Right  11/13/2011  *RADIOLOGY REPORT*  Clinical Data: Hip lesion  DG OPERATIVE RIGHT HIP  Comparison: 11/01/2011  Findings: Three spot images demonstrate a dynamic compression screw and intramedullary rod transfixed in the proximal right femur.  One distal interlocking screw.  Anatomic alignment.  No breakage or loosening of the hardware.  IMPRESSION:  Internal fixation proximal right femur.  Original Report Authenticated By: Donavan Burnet, M.D.   Ct Chest Wo Contrast  11/26/2011  *RADIOLOGY REPORT*  Clinical Data:  History of lung cancer.  Abdominal pain and nausea.  CT CHEST, ABDOMEN AND PELVIS WITHOUT CONTRAST  Technique:  Multidetector CT imaging of the chest, abdomen and pelvis was performed following the standard protocol without IV contrast.  Comparison:  Prior CT scan 08/21/2011.  CT CHEST  Findings:  The chest wall is unremarkable and stable.  The right- sided Port-A-Cath is stable.  No breast masses, supraclavicular or axillary adenopathy.  The thyroid gland appears  normal.  Dense vascular calcifications.  The bony thorax is intact.  Stable osteoporosis.  The heart is mildly enlarged but stable.  No pericardial effusion. Stable dense aortic and coronary artery calcifications.  No mediastinal or hilar lymphadenopathy.  The esophagus is grossly normal.  Stable emphysematous changes in the lungs.  Stable radiation changes involving the paramediastinal lungs. There are new and enlarging bilateral pulmonary nodules consistent with metastatic pulmonary disease.  No pleural effusions or pulmonary edema.  IMPRESSION:  1. New and enlarging pulmonary nodules consistent with pulmonary metastatic disease. 2.  Stable radiation changes. 3.  Stable emphysematous changes.  CT ABDOMEN AND PELVIS  Findings:  The liver is grossly normal without contrast.  No obvious metastatic lesions.  No biliary dilatation.  The gallbladder is slightly contracted.  The pancreas is normal.  The spleen is normal in size.  No focal lesions.  Stable bilateral adrenal gland masses consistent with benign adenomas.  No renal lesions.  The stomach, duodenum, small bowel and colon are grossly normal. No mesenteric or retroperitoneal masses or adenopathy.  Stable small scattered lymph nodes.  The aorta demonstrates advanced atherosclerotic calcifications as to the branch vessels.  A remote  chronic distal aortic dissection is noted.  There is a stable left adnexal cysts with benign CT imaging features.  No pelvic mass, adenopathy or free pelvic fluid collections.  No inguinal mass or hernia.  Examination of the bony structures demonstrates an enlarging lytic lesion involving the right iliac bone with cortical breakthrough. There is also a destructive lytic lesion involving the right femoral neck with associated soft tissue mass.  Subsequent placement of a femoral rod.  The lumbar vertebral bodies are intact.  IMPRESSION:  1.  Stable bilateral adrenal gland adenomas. 2.  Stable left adnexal cyst. 3.  Progressive lytic bone lesion involving the right ilium with associated destructive bony change. 4.  Interval placement of a femoral rod across the pathologic lesion near the lesser trochanter. 5.  No CT findings for metastatic hepatic disease or abdominal/pelvic adenopathy.  Original Report Authenticated By: P. Loralie Champagne, M.D.   Mr Lumbar Spine Wo Contrast  11/01/2011  *RADIOLOGY REPORT*  Clinical Data: Low back, right hip and groin pain for 2 months. History of lung cancer.  MRI LUMBAR SPINE WITHOUT CONTRAST  Technique:  Multiplanar and multiecho pulse sequences of the lumbar spine were obtained without intravenous contrast.  Comparison: Abdominal pelvic CT 08/21/2011.  PET CT 12/28/2009.  Findings: CT demonstrates five lumbar type vertebral bodies.  There is a moderate convex left scoliosis centered at L3.  There is no evidence of acute fracture or pars defect.  No definite metastases are seen within the lumbar spine.  A small lesion at L2 is likely a hemangioma.  There is a large lytic metastasis involving the right iliac bone and extending to the sacroiliac joint.  This is incompletely visualized by this examination.  It is better seen and further described on the MRI of the hip performed today and dictated separately.  The conus medullaris extends to the L1 level and appears normal. There are  no paraspinal abnormalities. Aortoiliac atherosclerosis and bilateral adrenal adenomas are unchanged. Cystic in the left adnexa is unchanged.  T12 - L1:  A right paracentral disc protrusion is only imaged in the sagittal plane and appears similar to the most recent CT. There is no cord deformity or foraminal compromise.  L1-L2: Mild disc bulging.  No significant spinal stenosis or nerve root encroachment.  L2-L3:  Mild disc  bulging.  No significant spinal stenosis or nerve root encroachment.  L3-L4:  Disc bulging and facet hypertrophy are asymmetric to the right.  There is mild narrowing of the right lateral recess.  No foraminal compromise or nerve root encroachment is seen.  L4-L5:  Disc bulging and facet hypertrophy are asymmetric to the left.  There is stable mild narrowing of the left lateral recess. No foraminal compromise or nerve root encroachment is seen.  L5-S1:  Disc bulging and facet hypertrophy are asymmetric to the left.  Mild narrowing of the left foramen is stable.  There is no nerve root encroachment.  IMPRESSION:  1.  Stable mild lumbar spondylosis associated with a scoliosis.  No high-grade spinal stenosis or nerve root encroachment identified.  2.  Large lytic metastasis involving the right iliac bone.  This is further described on separate report of the right hip. 3.  No metastases are identified within the lumbar spine.  Original Report Authenticated By: Gerrianne Scale, M.D.   Mr Hip Right Wo Contrast  11/01/2011  *RADIOLOGY REPORT*  Clinical Data: Low back, right hip and groin pain for 2 months. History of lung cancer.  MRI OF THE RIGHT HIP WITHOUT CONTRAST  Technique:  Multiplanar, multisequence MR imaging was performed. No intravenous contrast was administered.  Comparison: Hip radiographs 10/02/2011.  Abdominal pelvic CT 08/21/2011.  Findings: There is a large lytic metastasis involving the intertrochanteric region of the right femur.  This measures at least 5.2 cm cephalocaudad and  is associated with a cortical break anteriorly and anterior extension of tumor into the soft tissues. There is surrounding soft tissue edema and a small right hip joint effusion.  There is a large lytic metastasis involving the right iliac bone superiorly adjacent to the sacroiliac joint.  This measures at least 5.6 cm cephalocaudad and is associated with cortical breakthrough laterally.  There is a small metastasis involving the left iliac bone.  This is best seen on coronal T1 image #9.  There is no definite sacral lesion.  The sacroiliac joints are intact.  The patient is status post left proximal femoral ORIF.  A 4.6 cm cystic left adnexal lesion is unchanged from prior CTs. There is no pelvic or inguinal lymphadenopathy.  IMPRESSION:  1.  Large lytic metastasis involving the medial intertrochanteric region of the right femur.  There is cortical breakthrough with anterior medial soft tissue extension of tumor.  This metastasis is at high risk for complete pathologic fracture. 2.  Large lytic metastasis involving the right iliac bone adjacent to the sacroiliac joint.  Smaller left iliac metastasis. 3.  Stable cystic left adnexal lesion.  Critical Value/emergent results were called by telephone at the time of interpretation on 11/01/2011  at 1725 hours  to  Dr. Anna Genre, who verbally acknowledged these results.  Original Report Authenticated By: Gerrianne Scale, M.D.    ASSESSMENT: This is a very pleasant 71 years old white female with metastatic non-small cell lung cancer with recent disease progression involving a large lytic metastatic lesion in the intertrochanteric region of the right femur as well as new and enlarging pulmonary nodules.Marland Kitchen  PLAN: I discussed the scan results with the patient and her niece. I recommended for her to continue his palliative radiotherapy to the right hip. I also discussed with her systemic therapy in the form of Tarceva 150 mg by mouth daily. I discussed with the patient  adverse effect of this treatment including but not limited to skin rash, diarrhea, interstitial lung disease,  liver or renal dysfunction. The patient would like to proceed with treatment as planned. She will come back for followup visit in 2 weeks for evaluation and management any adverse effects of her treatment. The patient was advised to call me immediately if he has any concerning symptoms in the interval.  All questions were answered. The patient knows to call the clinic with any problems, questions or concerns. We can certainly see the patient much sooner if necessary.  I spent 15 minutes counseling the patient face to face. The total time spent in the appointment was 25 minutes.

## 2011-11-27 NOTE — Telephone Encounter (Signed)
[name] lefet message for nurse to call - she has some questions.

## 2011-11-27 NOTE — Telephone Encounter (Signed)
Gave pt appt calendar for 12/11/11/lab and ML

## 2011-11-27 NOTE — Telephone Encounter (Signed)
Returned [employer].. I spoke to pt to see if okay to talk to Marylu Lund and pt said it was okay. Marylu Lund understood Dr Donnald Garre tell her and pt that the cancer had spread to her bone. Marylu Lund asked how Dr. Donnald Garre  knew pt cancer had spread to her bone when she didn't have a biopsy. I explained to her that it is based on her recent scans. She voiced understanding " well that just burst my bubble".

## 2011-12-02 ENCOUNTER — Ambulatory Visit
Admission: RE | Admit: 2011-12-02 | Discharge: 2011-12-02 | Disposition: A | Discharge status: Home / Self Care | Payer: No Typology Code available for payment source | Source: Ambulatory Visit | Attending: Radiation Oncology | Admitting: Radiation Oncology

## 2011-12-02 DIAGNOSIS — C7952 Secondary malignant neoplasm of bone marrow: Secondary | ICD-10-CM

## 2011-12-02 NOTE — Progress Notes (Signed)
Met with patient to discuss RO billing.  Dx: 162.9  Attending Rad: Dr. Marian Sorrow Tx: [id number] Extrl Beam

## 2011-12-02 NOTE — Progress Notes (Signed)
  Radiation Oncology         [phone] ________________________________  Name: [name] MRN: [id number]  Date: 12/02/2011  DOB: 11/23/1940  SIMULATION AND TREATMENT PLANNING NOTE  DIAGNOSIS:  Lung Cancer with metastasis to right proximal femur  NARRATIVE:  The patient was brought to the CT Simulation planning suite.  Identity was confirmed.  All relevant records and images related to the planned course of therapy were reviewed.  The patient freely provided informed written consent to proceed with treatment after reviewing the details related to the planned course of therapy. The consent form was witnessed and verified by the simulation staff.  Then, the patient was set-up in a stable reproducible  supine position for radiation therapy.  CT images were obtained.  Surface markings were placed.  The CT images were loaded into the planning software.  Then the target and avoidance structures were contoured.  Treatment planning then occurred.  The radiation prescription was entered and confirmed.  A total of 3 complex treatment devices were fabricated. I have requested : Isodose Plan.   PLAN:  The patient will receive 30.0 Gy in 10 fractions.  ________________________________   Billie Lade, PhD, MD

## 2011-12-03 ENCOUNTER — Ambulatory Visit
Admission: RE | Admit: 2011-12-03 | Discharge: 2011-12-03 | Disposition: A | Discharge status: Home / Self Care | Payer: No Typology Code available for payment source | Source: Ambulatory Visit | Attending: Radiation Oncology | Admitting: Radiation Oncology

## 2011-12-03 DIAGNOSIS — C7951 Secondary malignant neoplasm of bone: Secondary | ICD-10-CM

## 2011-12-03 NOTE — Progress Notes (Signed)
  Radiation Oncology         [phone] ________________________________  Name: [name] MRN: [id number]  Date: 12/03/2011  DOB: [birth year]  Simulation Verification Note  Status: outpatient  NARRATIVE: The patient was brought to the treatment unit and placed in the planned treatment position. The clinical setup was verified. Then port films were obtained and uploaded to the radiation oncology medical record software.  The treatment beams were carefully compared against the planned radiation fields. The position location and shape of the radiation fields was reviewed. They targeted volume of tissue appears to be appropriately covered by the radiation beams. Organs at risk appear to be excluded as planned.  Based on my personal review, I approved the simulation verification. The patient's treatment will proceed as planned.  -----------------------------------  Billie Lade, PhD, MD

## 2011-12-04 ENCOUNTER — Ambulatory Visit
Admission: RE | Admit: 2011-12-04 | Discharge: 2011-12-04 | Disposition: A | Discharge status: Home / Self Care | Payer: No Typology Code available for payment source | Source: Ambulatory Visit | Attending: Radiation Oncology | Admitting: Radiation Oncology

## 2011-12-04 DIAGNOSIS — C7951 Secondary malignant neoplasm of bone: Secondary | ICD-10-CM

## 2011-12-04 NOTE — Progress Notes (Signed)
  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  Identity was confirmed.  All relevant records and images related to the planned course of therapy were reviewed.  The patient freely provided informed written consent to proceed with treatment after reviewing the details related to the planned course of therapy. The consent form was witnessed and verified by the simulation staff.  Then, the patient was set-up in a stable reproducible  supine position for radiation therapy.  CT images were obtained.  Surface markings were placed.  The CT images were loaded into the planning software.  Then the target and avoidance structures were contoured.  Treatment planning then occurred.  The radiation prescription was entered and confirmed.  A total of 3 complex treatment devices were fabricated. I have requested : Isodose Plan.  I have ordered:weekly port films, dose calc.  PLAN:  The patient will receive 35.0  Gy in 14 fractions.  ________________________________   Billie Lade, PhD, MD

## 2011-12-05 ENCOUNTER — Ambulatory Visit
Admission: RE | Admit: 2011-12-05 | Discharge: 2011-12-05 | Disposition: A | Discharge status: Home / Self Care | Payer: No Typology Code available for payment source | Source: Ambulatory Visit | Attending: Radiation Oncology | Admitting: Radiation Oncology

## 2011-12-05 NOTE — Progress Notes (Signed)
Encounter addended by: Delynn Flavin, RN on: 12/05/2011  6:11 PM<BR>     Documentation filed: Charges VN

## 2011-12-07 ENCOUNTER — Emergency Department (HOSPITAL_COMMUNITY): Payer: No Typology Code available for payment source

## 2011-12-07 ENCOUNTER — Encounter (HOSPITAL_COMMUNITY): Payer: Self-pay | Admitting: Emergency Medicine

## 2011-12-07 ENCOUNTER — Inpatient Hospital Stay (HOSPITAL_COMMUNITY)
Admission: EM | Admit: 2011-12-07 | Discharge: 2011-12-13 | DRG: 064 | Disposition: A | Discharge status: Home / Self Care | Payer: No Typology Code available for payment source | Attending: Internal Medicine | Admitting: Internal Medicine

## 2011-12-07 ENCOUNTER — Inpatient Hospital Stay (HOSPITAL_COMMUNITY): Payer: No Typology Code available for payment source

## 2011-12-07 DIAGNOSIS — R0902 Hypoxemia: Secondary | ICD-10-CM | POA: Diagnosis present

## 2011-12-07 DIAGNOSIS — I2699 Other pulmonary embolism without acute cor pulmonale: Secondary | ICD-10-CM

## 2011-12-07 DIAGNOSIS — E876 Hypokalemia: Secondary | ICD-10-CM | POA: Diagnosis present

## 2011-12-07 DIAGNOSIS — J4489 Other specified chronic obstructive pulmonary disease: Secondary | ICD-10-CM | POA: Diagnosis present

## 2011-12-07 DIAGNOSIS — Z66 Do not resuscitate: Secondary | ICD-10-CM | POA: Diagnosis present

## 2011-12-07 DIAGNOSIS — Z85118 Personal history of other malignant neoplasm of bronchus and lung: Secondary | ICD-10-CM

## 2011-12-07 DIAGNOSIS — E669 Obesity, unspecified: Secondary | ICD-10-CM | POA: Diagnosis present

## 2011-12-07 DIAGNOSIS — G459 Transient cerebral ischemic attack, unspecified: Secondary | ICD-10-CM

## 2011-12-07 DIAGNOSIS — I10 Essential (primary) hypertension: Secondary | ICD-10-CM | POA: Diagnosis present

## 2011-12-07 DIAGNOSIS — M25559 Pain in unspecified hip: Secondary | ICD-10-CM | POA: Diagnosis present

## 2011-12-07 DIAGNOSIS — M84559A Pathological fracture in neoplastic disease, hip, unspecified, initial encounter for fracture: Secondary | ICD-10-CM

## 2011-12-07 DIAGNOSIS — Z9981 Dependence on supplemental oxygen: Secondary | ICD-10-CM

## 2011-12-07 DIAGNOSIS — I6350 Cerebral infarction due to unspecified occlusion or stenosis of unspecified cerebral artery: Principal | ICD-10-CM | POA: Diagnosis present

## 2011-12-07 DIAGNOSIS — C3490 Malignant neoplasm of unspecified part of unspecified bronchus or lung: Secondary | ICD-10-CM | POA: Diagnosis present

## 2011-12-07 DIAGNOSIS — J449 Chronic obstructive pulmonary disease, unspecified: Secondary | ICD-10-CM | POA: Insufficient documentation

## 2011-12-07 DIAGNOSIS — Z86718 Personal history of other venous thrombosis and embolism: Secondary | ICD-10-CM

## 2011-12-07 DIAGNOSIS — E46 Unspecified protein-calorie malnutrition: Secondary | ICD-10-CM | POA: Diagnosis present

## 2011-12-07 DIAGNOSIS — M84453A Pathological fracture, unspecified femur, initial encounter for fracture: Secondary | ICD-10-CM | POA: Diagnosis present

## 2011-12-07 DIAGNOSIS — I639 Cerebral infarction, unspecified: Secondary | ICD-10-CM | POA: Diagnosis present

## 2011-12-07 DIAGNOSIS — C7952 Secondary malignant neoplasm of bone marrow: Secondary | ICD-10-CM | POA: Diagnosis present

## 2011-12-07 DIAGNOSIS — Z853 Personal history of malignant neoplasm of breast: Secondary | ICD-10-CM

## 2011-12-07 DIAGNOSIS — C7951 Secondary malignant neoplasm of bone: Secondary | ICD-10-CM | POA: Insufficient documentation

## 2011-12-07 DIAGNOSIS — D638 Anemia in other chronic diseases classified elsewhere: Secondary | ICD-10-CM | POA: Diagnosis present

## 2011-12-07 LAB — DIFFERENTIAL
Basophils Absolute: 0 10*3/uL (ref 0.0–0.1)
Basophils Relative: 0 % (ref 0–1)
Eosinophils Relative: 3 % (ref 0–5)
Lymphocytes Relative: 10 % — ABNORMAL LOW (ref 12–46)
Monocytes Absolute: 0.7 10*3/uL (ref 0.1–1.0)
Monocytes Relative: 9 % (ref 3–12)
Neutro Abs: 6.2 10*3/uL (ref 1.7–7.7)

## 2011-12-07 LAB — HEMOGLOBIN A1C
Hgb A1c MFr Bld: 5.6 % (ref <5.7)
Mean Plasma Glucose: 114 mg/dL (ref <117)

## 2011-12-07 LAB — HEPARIN LEVEL (UNFRACTIONATED)
Heparin Unfractionated: <0.1 IU/mL — ABNORMAL LOW (ref 0.30–0.70)
Heparin Unfractionated: 0.49 IU/mL (ref 0.30–0.70)

## 2011-12-07 LAB — BASIC METABOLIC PANEL
BUN: 14 mg/dL (ref 6–23)
CO2: >45 mEq/L (ref 19–32)
Calcium: 9.2 mg/dL (ref 8.4–10.5)
Chloride: 95 mEq/L — ABNORMAL LOW (ref 96–112)
Creatinine, Ser: 0.55 mg/dL (ref 0.50–1.10)
Glucose, Bld: 144 mg/dL — ABNORMAL HIGH (ref 70–99)

## 2011-12-07 LAB — BLOOD GAS, ARTERIAL
Acid-Base Excess: 15.8 mmol/L — ABNORMAL HIGH (ref 0.0–2.0)
Drawn by: 330991
O2 Saturation: 66.8 %
TCO2: 43 mmol/L (ref 0–100)
pCO2 arterial: 63.3 mmHg (ref 35.0–45.0)

## 2011-12-07 LAB — CBC
HCT: 36.6 % (ref 36.0–46.0)
Hemoglobin: 11.4 g/dL — ABNORMAL LOW (ref 12.0–15.0)
MCHC: 31.1 g/dL (ref 30.0–36.0)
MCV: 97.3 fL (ref 78.0–100.0)
RDW: 17 % — ABNORMAL HIGH (ref 11.5–15.5)

## 2011-12-07 LAB — POCT I-STAT, CHEM 8
Calcium, Ion: 1.16 mmol/L (ref 1.12–1.32)
Chloride: 89 mEq/L — ABNORMAL LOW (ref 96–112)
Creatinine, Ser: 0.9 mg/dL (ref 0.50–1.10)
Glucose, Bld: 119 mg/dL — ABNORMAL HIGH (ref 70–99)
HCT: 38 % (ref 36.0–46.0)
Potassium: 2.6 mEq/L — CL (ref 3.5–5.1)

## 2011-12-07 LAB — PROTIME-INR: INR: 1.34 (ref 0.00–1.49)

## 2011-12-07 LAB — CARDIAC PANEL(CRET KIN+CKTOT+MB+TROPI)
CK, MB: 4 ng/mL (ref 0.3–4.0)
Relative Index: INVALID (ref 0.0–2.5)
Relative Index: INVALID (ref 0.0–2.5)
Total CK: 53 U/L (ref 7–177)
Total CK: 41 U/L (ref 7–177)
Troponin I: 0.5 ng/mL (ref <0.30)

## 2011-12-07 LAB — POCT I-STAT TROPONIN I

## 2011-12-07 LAB — GLUCOSE, CAPILLARY

## 2011-12-07 MED ORDER — BISOPROLOL FUMARATE 5 MG PO TABS
2.5000 mg | ORAL_TABLET | Freq: Every day | ORAL | Status: DC
  Administered 2011-12-07 – 2011-12-09 (×3): 2.5 mg via ORAL
  Administered 2011-12-10: 10:00:00 via ORAL
  Administered 2011-12-11 – 2011-12-13 (×3): 2.5 mg via ORAL
  Filled 2011-12-07 (×7): qty 0.5

## 2011-12-07 MED ORDER — POTASSIUM CHLORIDE 10 MEQ/100ML IV SOLN
10.0000 meq | INTRAVENOUS | Status: DC
  Filled 2011-12-07: qty 100

## 2011-12-07 MED ORDER — SODIUM CHLORIDE 0.9 % IJ SOLN
10.0000 mL | INTRAMUSCULAR | Status: DC | PRN
  Administered 2011-12-07: 10 mL
  Administered 2011-12-07: 20 mL
  Administered 2011-12-10 – 2011-12-13 (×6): 10 mL

## 2011-12-07 MED ORDER — LISINOPRIL 20 MG PO TABS
20.0000 mg | ORAL_TABLET | Freq: Every day | ORAL | Status: DC
  Administered 2011-12-07 – 2011-12-13 (×7): 20 mg via ORAL
  Filled 2011-12-07 (×7): qty 1

## 2011-12-07 MED ORDER — POTASSIUM CHLORIDE 10 MEQ/100ML IV SOLN
10.0000 meq | INTRAVENOUS | Status: DC
  Filled 2011-12-07 (×4): qty 100

## 2011-12-07 MED ORDER — HEPARIN (PORCINE) IN NACL 100-0.45 UNIT/ML-% IJ SOLN
1100.0000 [IU]/h | INTRAMUSCULAR | Status: DC
  Administered 2011-12-08: 1100 [IU]/h via INTRAVENOUS
  Filled 2011-12-07 (×2): qty 250

## 2011-12-07 MED ORDER — GADOBENATE DIMEGLUMINE 529 MG/ML IV SOLN
10.0000 mL | Freq: Once | INTRAVENOUS | Status: AC | PRN
  Administered 2011-12-07: 10 mL via INTRAVENOUS

## 2011-12-07 MED ORDER — HEPARIN BOLUS VIA INFUSION
1500.0000 [IU] | Freq: Once | INTRAVENOUS | Status: AC
  Administered 2011-12-07: 1500 [IU] via INTRAVENOUS
  Filled 2011-12-07: qty 1500

## 2011-12-07 MED ORDER — POTASSIUM CHLORIDE CRYS ER 20 MEQ PO TBCR
EXTENDED_RELEASE_TABLET | ORAL | Status: AC
  Filled 2011-12-07: qty 2

## 2011-12-07 MED ORDER — IOHEXOL 350 MG/ML SOLN
100.0000 mL | Freq: Once | INTRAVENOUS | Status: AC | PRN
  Administered 2011-12-07: 100 mL via INTRAVENOUS

## 2011-12-07 MED ORDER — POTASSIUM CHLORIDE CRYS ER 20 MEQ PO TBCR
40.0000 meq | EXTENDED_RELEASE_TABLET | Freq: Once | ORAL | Status: AC
  Administered 2011-12-07: 40 meq via ORAL

## 2011-12-07 MED ORDER — HEPARIN BOLUS VIA INFUSION
3500.0000 [IU] | Freq: Once | INTRAVENOUS | Status: AC
  Administered 2011-12-07: 3500 [IU] via INTRAVENOUS

## 2011-12-07 MED ORDER — POTASSIUM CHLORIDE CRYS ER 20 MEQ PO TBCR
40.0000 meq | EXTENDED_RELEASE_TABLET | Freq: Every day | ORAL | Status: DC
  Administered 2011-12-07 – 2011-12-08 (×2): 40 meq via ORAL
  Filled 2011-12-07 (×2): qty 2

## 2011-12-07 MED ORDER — FOLIC ACID 1 MG PO TABS
1.0000 mg | ORAL_TABLET | Freq: Every day | ORAL | Status: DC
  Administered 2011-12-07 – 2011-12-13 (×7): 1 mg via ORAL
  Filled 2011-12-07 (×7): qty 1

## 2011-12-07 MED ORDER — HEPARIN (PORCINE) IN NACL 100-0.45 UNIT/ML-% IJ SOLN: 16.0000 [IU]/kg/h | INTRAMUSCULAR | Status: DC

## 2011-12-07 MED ORDER — FUROSEMIDE 20 MG PO TABS
20.0000 mg | ORAL_TABLET | Freq: Two times a day (BID) | ORAL | Status: DC | PRN
  Administered 2011-12-07: 20 mg via ORAL
  Filled 2011-12-07: qty 1

## 2011-12-07 MED ORDER — TIOTROPIUM BROMIDE MONOHYDRATE 18 MCG IN CAPS
18.0000 ug | ORAL_CAPSULE | Freq: Every day | RESPIRATORY_TRACT | Status: DC
  Administered 2011-12-07 – 2011-12-13 (×6): 18 ug via RESPIRATORY_TRACT
  Filled 2011-12-07 (×2): qty 5

## 2011-12-07 MED ORDER — SODIUM CHLORIDE 0.9 % IJ SOLN: 10.0000 mL | Freq: Two times a day (BID) | INTRAMUSCULAR | Status: AC

## 2011-12-07 MED ORDER — HEPARIN BOLUS VIA INFUSION: 5000.0000 [IU] | Freq: Once | INTRAVENOUS | Status: DC

## 2011-12-07 MED ORDER — MORPHINE SULFATE 4 MG/ML IJ SOLN
4.0000 mg | Freq: Once | INTRAMUSCULAR | Status: AC
  Administered 2011-12-07: 4 mg via INTRAVENOUS
  Filled 2011-12-07: qty 1

## 2011-12-07 MED ORDER — DEXAMETHASONE 4 MG PO TABS: 4.0000 mg | ORAL_TABLET | ORAL | Status: DC

## 2011-12-07 MED ORDER — OXYCODONE-ACETAMINOPHEN 5-325 MG PO TABS
2.0000 | ORAL_TABLET | Freq: Once | ORAL | Status: AC
  Administered 2011-12-07: 2 via ORAL
  Filled 2011-12-07: qty 2

## 2011-12-07 MED ORDER — OXYCODONE-ACETAMINOPHEN 5-325 MG PO TABS
2.0000 | ORAL_TABLET | ORAL | Status: DC | PRN
  Administered 2011-12-07 – 2011-12-13 (×11): 2 via ORAL
  Filled 2011-12-07 (×11): qty 2

## 2011-12-07 MED ORDER — ASPIRIN 325 MG PO TABS
325.0000 mg | ORAL_TABLET | Freq: Every day | ORAL | Status: DC
  Administered 2011-12-07 – 2011-12-10 (×4): 325 mg via ORAL
  Filled 2011-12-07 (×4): qty 1

## 2011-12-07 MED ORDER — POTASSIUM CHLORIDE 10 MEQ/100ML IV SOLN
10.0000 meq | Freq: Once | INTRAVENOUS | Status: AC
  Administered 2011-12-07: 10 meq via INTRAVENOUS
  Filled 2011-12-07: qty 100

## 2011-12-07 NOTE — ED Notes (Signed)
Per EMS, patient from home.  Patient was short of breath this morning (patient has history of lung cancer and COPD).  Patient went outside this morning around 1030 to smoke when she fell (states that her "leg went out underneath her").  Patient denies pain from fall, but states that she has been unable to get up from a sitting position and ambulate (per her normal) since the fall this morning.  Patient has also not urinated since this morning; no bowel movement in 2-3 days.  Patient was seen here three weeks ago for rod placement in right leg.  Patient does have portacath due to chemo treatments; refused IV.

## 2011-12-07 NOTE — ED Notes (Addendum)
Pt states that she slid down but did not fall, like her right leg gave out on her. Recent surgery placed rod in that leg 3 weeks ago. Denies pain different than leg pain since surgery.  Edema noted bilateral lower extremities.

## 2011-12-07 NOTE — Progress Notes (Signed)
ANTICOAGULATION CONSULT NOTE - Follow Up Consult  Pharmacy Consult for Heparin Indication: pulmonary embolus  Allergies  Allergen Reactions  . Other Hives    Cheap jewelry.    Patient Measurements: Height: 5' 4.17" (163 cm) Weight: 125 lb 14.1 oz (57.1 kg) IBW/kg (Calculated) : 55.1  Heparin Dosing Weight: 57.1 kg  Assessment: 71yo female with (+)B-PE, head CT (-)stroke.  Heparin level therapeutic after rate increase.  No bleeding per chart.  Goal of Therapy:  Heparin level 0.3-0.7 units/ml Monitor platelets by anticoagulation protocol: Yes   Plan:  1.  Continue heparin infusion at current rate (1100 units/hr) 2.  F/u Am heparin level to confirm therapeutic rate.  Bayard Hugger, PharmD, BCPS  Clinical Pharmacist  Pager: [phone]

## 2011-12-07 NOTE — Progress Notes (Signed)
12/07/2011 4:45 PM Nursing note Blood gas results called to MD. Orders received. Will continue to monitor.  Essence Merle, Blanchard Kelch

## 2011-12-07 NOTE — ED Provider Notes (Signed)
History     CSN: [id number]  Arrival date & time 12/07/11  6962   First MD Initiated Contact with Patient 12/07/11 0107      Chief Complaint  Patient presents with  . Fall    (Consider location/radiation/quality/duration/timing/severity/associated sxs/prior treatment) HPI Comments: 71 year old female with a history of metastatic lung cancer, COPD, hip fracture on the left and a recent intramedullary rod procedure for pending pathologic fracture of the right femur. This was done approximately 2 weeks ago. She presents today after a fall about 18 hours ago that occurred when her right leg became weak while she was walking with her walker. She fell on her right side was able to get up with assistance and has had ongoing pain throughout the day. She denies deformity but does admit to having significant bilateral lower extremity edema and associated with this some significant shortness of breath. This is more than usual and the patient does not require home oxygen at baseline. Symptoms are persistent, moderate, gradually worsening, not associated with fevers  Patient is a 71 y.o. female presenting with fall. The history is provided by the patient and the EMS personnel.  Fall    Past Medical History  Diagnosis Date  . Hypertension   . Heart murmur   . Hip fracture, left   . COPD (chronic obstructive pulmonary disease)   . Skin cancer   . Lung cancer 09/18/2005    rul =[facility] dx  . Cancer 11/01/11     mets rightfemur//iliac 5.6cm,&left iliac  . Skin cancer   . 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 Scoliosis 11/01/11    l3  . History of chemotherapy     6 cycles carboplatin/paclitaxel,last dose 08/2006,again given 07/22/2010,alimta q 3weeks s/p 9 cycless  . History of radiation therapy 08/10/2007-09/07/2007    right paratracheal area/ Dr.Kinard, [place]  . History of radiation therapy 2007    bilateral sterotatic radiotherapy Dr.McMullen at [facility] completed  11/25/2005  . Anemia     Past Surgical History  Procedure Date  . Portacath placement   . Hip surgery     LEFT HIP  . Femur im nail 11/13/2011    Procedure: INTRAMEDULLARY (IM) NAIL FEMORAL;  Surgeon: Senaida Lange, MD;  Location: [facility] OR;  Service: Orthopedics;  Laterality: Right;  IM NAILING OF RIGHT HIP    Family History  Problem Relation Age of Onset  . Breast cancer Paternal Grandmother   . Prostate cancer Maternal Uncle     brainand colon ca  . Heart attack Mother 52    History  Substance Use Topics  . Smoking status: Current Everyday Smoker -- 0.5 packs/day for 50 years    Types: Cigarettes  . Smokeless tobacco: Not on file  . Alcohol Use: No    OB History    Grav Para Term Preterm Abortions TAB SAB Ect Mult Living                  Review of Systems  All other systems reviewed and are negative.    Allergies  Other  Home Medications   Current Outpatient Rx  Name Route Sig Dispense Refill  . BISOPROLOL FUMARATE 5 MG PO TABS Oral Take 2.5 mg by mouth daily.     Marland Kitchen FOLIC ACID 1 MG PO TABS Oral Take 1 mg by mouth daily.      . FUROSEMIDE 20 MG PO TABS Oral Take 20 mg by mouth 2 (two) times  daily as needed. For edema    . LISINOPRIL 20 MG PO TABS Oral Take 20 mg by mouth daily.      Marland Kitchen NAPROXEN SODIUM 220 MG PO TABS Oral Take 220 mg by mouth daily as needed. For pain    . OXYCODONE-ACETAMINOPHEN 5-325 MG PO TABS Oral Take 1 tablet by mouth every 4 (four) hours as needed. take 1-2 every 4-6hrs    . TIOTROPIUM BROMIDE MONOHYDRATE 18 MCG IN CAPS Inhalation Place 18 mcg into inhaler and inhale daily.    Marland Kitchen DEXAMETHASONE 4 MG PO TABS Oral Take 4 mg by mouth as directed. Take 1 tablet twice daily the day before,the day of and the day after chemo      BP 147/90  Pulse 99  Temp(Src) 98.6 F (37 C) (Oral)  Resp 18  Ht 5' 4.17" (1.63 m)  Wt 125 lb 14.1 oz (57.1 kg)  BMI 21.49 kg/m2  SpO2 99%  Physical Exam  Nursing note and vitals reviewed. Constitutional: She  appears well-developed and well-nourished. No distress.  HENT:  Head: Normocephalic and atraumatic.  Mouth/Throat: Oropharynx is clear and moist. No oropharyngeal exudate.  Eyes: Conjunctivae and EOM are normal. Pupils are equal, round, and reactive to light. Right eye exhibits no discharge. Left eye exhibits no discharge. No scleral icterus.  Neck: Normal range of motion. Neck supple. No JVD present. No thyromegaly present.  Cardiovascular: Regular rhythm, normal heart sounds and intact distal pulses.  Exam reveals no gallop and no friction rub.   No murmur heard.      Tachycardia  Pulmonary/Chest: No respiratory distress. She has wheezes. She has rales.       Bilateral wheezing and rales, slight increased work of breathing  Abdominal: Soft. Bowel sounds are normal. She exhibits no distension and no mass. There is no tenderness.  Musculoskeletal: Normal range of motion. She exhibits edema ( Bilateral lower extremity edema, no significant asymmetry, no erythema). She exhibits no tenderness.  Lymphadenopathy:    She has no cervical adenopathy.  Neurological: She is alert. Coordination normal.  Skin: Skin is warm and dry. No rash noted. No erythema.  Psychiatric: She has a normal mood and affect. Her behavior is normal.    ED Course  Procedures (including critical care time)  Labs Reviewed  CBC - Abnormal; Notable for the following:    RBC 3.76 (*)    Hemoglobin 11.4 (*)    RDW 17.0 (*)    All other components within normal limits  DIFFERENTIAL - Abnormal; Notable for the following:    Neutrophils Relative 78 (*)    Lymphocytes Relative 10 (*)    All other components within normal limits  PRO B NATRIURETIC PEPTIDE - Abnormal; Notable for the following:    Pro B Natriuretic peptide (BNP) 11771.0 (*)    All other components within normal limits  POCT I-STAT, CHEM 8 - Abnormal; Notable for the following:    Potassium 2.6 (*)    Chloride 89 (*)    Glucose, Bld 119 (*)    All other  components within normal limits  POCT I-STAT TROPONIN I - Abnormal; Notable for the following:    Troponin i, poc 0.52 (*)    All other components within normal limits   Dg Chest 1 View  12/07/2011  *RADIOLOGY REPORT*  Clinical Data: Fall.  History of lung cancer.  CHEST - 1 VIEW  Comparison: Chest 11/11/2011.  The CT chest 11/26/2011.  Findings: Cardiac enlargement.  Normal pulmonary vascularity. Emphysematous  changes in the lungs with scattered fibrosis.  Focal scarring in the right upper lung may represent postradiation change.  Spiculated appearing nodular opacities in both hilar regions may be related to scarring but metastasis is not excluded. No focal airspace consolidation.  No blunting of costophrenic angles.  No pneumothorax.  Degenerative changes and scoliosis of the spine.  Calcification of the aorta.  Right power port type central venous catheter with tip over the mid SVC region.  IMPRESSION: Cardiac enlargement.  Emphysema and fibrosis in the lungs. Scarring bilaterally.  Spiculated appearing nodular opacities in both hilar regions.  No active infiltration.  Original Report Authenticated By: Marlon Pel, M.D.   Dg Femur Right  12/07/2011  *RADIOLOGY REPORT*  Clinical Data: Pain after fall.  RIGHT FEMUR - 2 VIEW  Comparison: 11/13/2011  Findings: Postoperative changes in the right hip with intramedullary rod and screw and compression screw fixation.  No evidence of acute fracture or displacement.  No focal bone lesion or bone destruction.  Bone cortex and trabecular architecture appear intact.  Vascular calcifications.  Degenerative changes in the knee.  No significant change in positioning of hardware since previous study.  IMPRESSION: Postoperative changes with internal fixation of the proximal right femur.  No acute bony abnormalities identified.  Original Report Authenticated By: Marlon Pel, M.D.   Ct Angio Chest W/cm &/or Wo Cm  12/07/2011  *RADIOLOGY REPORT*  Clinical Data:  Short of breath.  History of lung cancer and COPD. Fall.  CT ANGIOGRAPHY CHEST  Technique:  Multidetector CT imaging of the chest using the standard protocol during bolus administration of intravenous contrast. Multiplanar reconstructed images including MIPs were obtained and reviewed to evaluate the vascular anatomy.  Contrast: OMNIPAQUE IOHEXOL 350 MG/ML SOLN  Comparison: 11/26/2011  Findings: Technically adequate study with good opacification of the central and segmental pulmonary arteries.  There are focal intraluminal filling defects in the scattered bilateral segmental branches consistent with pulmonary emboli.  Cardiac enlargement.  Calcification of the aortic valve and coronary arteries.  Calcification and ectasia of the aorta. Scarring in the mid lungs bilaterally with mass or adenopathy in the right hilum measuring about 15 mm diameter.  Pretracheal lymphadenopathy measures 12 mm short axis dimension.  Multiple pulmonary nodules are again demonstrated bilaterally consistent with metastatic disease.  Probably the largest is a spiculated nodule in the right upper lung measuring 8 mm diameter. Visualization of the lungs is limited due to respiratory motion artifact.  Diffuse emphysematous changes and fibrosis throughout the lungs.  Atelectasis in the lung bases.  Focal wedge-shaped consolidation in the right lung base posteriorly may represent a focal infarct.  Bronchiectasis and mucus plugging in the lung bases with peribronchial thickening consistent with chronic bronchitis. Bilateral adrenal gland nodules measuring 2.6 cm on the left and 3.3 cm on the right.  These demonstrate low density measurements suggesting adenomas.  Degenerative changes in the thoracic spine. No destructive bone lesions appreciated. Minimal right pleural effusion.  IMPRESSION: Positive study for multiple bilateral segmental pulmonary emboli. Possible small peripheral infarct in the right lung base posteriorly.  Again  demonstrated are multiple diffuse bilateral pulmonary metastases, right hilar mass, diffuse emphysematous changes, and fibrosis. Chronic bronchitic changes.  Critical Value/emergent results were called by telephone at the time of interpretation on 12/07/2011  at 0440 hours  to  Dr. Hyacinth Meeker, who verbally acknowledged these results. Cardiac enlargement.  Bilateral adrenal gland nodules.  Original Report Authenticated By: Marlon Pel, M.D.     1.  Pulmonary embolism       MDM  Tachycardiac, hypoxia to 84% on room air, increased work of breathing. The patient is postop 2 weeks in addition to be having metastatic cancer which are 2 significant risk factors for pulmonary embolism. We'll perform chest x-ray, femur x-ray, labs, rule out coronary embolism. Rule out recurrent fracture of the right leg. Pain control.   ED ECG REPORT   Date: 12/07/2011   Rate: 102  Rhythm: sinus tachycardia  QRS Axis: normal  Intervals: PR prolonged  ST/T Wave abnormalities: nonspecific ST/T changes  Conduction Disutrbances:first-degree A-V block   Narrative Interpretation:   Old EKG Reviewed: Compared with 11/11/2011, low voltage no present, Q waves present inferior leads, T wave abnormalities now present   Due to the patient's ongoing tachycardiac, hypoxia and thrombolytic risk factors I have ordered a CT angiogram of the chest. This is positive for pulmonary embolism and multiple lobes and infarction of the lower right lobe of the lung. I have discussed her care with the [facility] hospitalist and the cardiologist, [facility] hospitalist will admit. Troponin is 0.5  Heparin drip ordered  CRITICAL CARE Performed by: Vida Roller   Total critical care time: 35  Critical care time was exclusive of separately billable procedures and treating other patients.  Critical care was necessary to treat or prevent imminent or life-threatening deterioration.  Critical care was time spent personally by me on the following  activities: development of treatment plan with patient and/or surrogate as well as nursing, discussions with consultants, evaluation of patient's response to treatment, examination of patient, obtaining history from patient or surrogate, ordering and performing treatments and interventions, ordering and review of laboratory studies, ordering and review of radiographic studies, pulse oximetry and re-evaluation of patient's condition.    Vida Roller, MD 12/07/11 [phone]

## 2011-12-07 NOTE — Progress Notes (Addendum)
[name] was admitted this morning with pulmonary embolism and question of stroke. Head CT scan is negative. Patient started on heparin. MRI of the head is still pending. Chart reviewed patient examined. Will continue to monitor.  Patient had cardiac markers drawn in the emergency room and troponin was slightly elevated at 0.52. trending troponin the second one came back at 0.67. Discussed with Dr. Jacinto Halim who is on-call for cardiology and he agrees that this elevated troponin is most likely secondary to the pulmonary embolism and with patient's metastatic breast cancer, DO NOT RESUSCITATE status no aggressive treatment would be necessary.  Patient CO2 is also elevated on BMP. She has COPD oxygen dependent at home. She is presently comfortable with no increased work of breathing. Will check blood gas.

## 2011-12-07 NOTE — ED Notes (Addendum)
Per family pt has not voided since Friday night. EDP Miller informed. Order for foley cath received

## 2011-12-07 NOTE — Progress Notes (Signed)
  Echocardiogram 2D Echocardiogram has been performed.  Markella Dao L 12/07/2011, 2:31 PM

## 2011-12-07 NOTE — Progress Notes (Signed)
ANTICOAGULATION CONSULT NOTE - Initial Consult  Pharmacy Consult for heparin Indication: pulmonary embolus  Allergies  Allergen Reactions  . Other Hives    Cheap jewelry.    Patient Measurements: Height: 5' 4.17" (163 cm) Weight: 125 lb 14.1 oz (57.1 kg) IBW/kg (Calculated) : 55.1  Heparin Dosing Weight: 57kg  Vital Signs: Temp: 98.6 F (37 C) (06/02 0309) Temp src: Oral (06/02 0309) BP: 147/90 mmHg (06/02 0309) Pulse Rate: 99  (06/02 0309)  Labs:  Basename 12/07/11 0253 12/07/11 0231  HGB 12.9 11.4*  HCT 38.0 36.6  PLT -- 196  APTT -- --  LABPROT -- --  INR -- --  HEPARINUNFRC -- --  CREATININE 0.90 --  CKTOTAL -- --  CKMB -- --  TROPONINI -- --    Estimated Creatinine Clearance: 49.9 ml/min (by C-G formula based on Cr of 0.9).   Medical History: Past Medical History  Diagnosis Date  . Hypertension   . Heart murmur   . Hip fracture, left   . COPD (chronic obstructive pulmonary disease)   . Skin cancer   . Lung cancer 09/18/2005    rul =[facility] dx  . Cancer 11/01/11     mets rightfemur//iliac 5.6cm,&left iliac  . Skin cancer   . 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 Scoliosis 11/01/11    l3  . History of chemotherapy     6 cycles carboplatin/paclitaxel,last dose 08/2006,again given 07/22/2010,alimta q 3weeks s/p 9 cycless  . History of radiation therapy 08/10/2007-09/07/2007    right paratracheal area/ Dr.Kinard, [place]  . History of radiation therapy 2007    bilateral sterotatic radiotherapy Dr.McMullen at [facility] completed 11/25/2005  . Anemia      Assessment: 70 YOF with new onset PE (CTA positive) to start IV heparin. Hgb 11.4, plt 196  Goal of Therapy:  Heparin level 0.3-0.7 units/ml Monitor platelets by anticoagulation protocol: Yes   Plan:  - Heparin Bolus 3500 units x 1, then heparin infusion 900 units/hr - f/u heparin level 8 hrs after infusion starts - daily cbc and heparin level - baseline PT/INR  Bayard Hugger,  PharmD, BCPS  Clinical Pharmacist  Pager: [phone]  12/07/2011,5:20 AM

## 2011-12-07 NOTE — ED Notes (Signed)
Patient returned from MRI scan IV restarted.

## 2011-12-07 NOTE — Progress Notes (Signed)
CRITICAL VALUE ALERT  Critical value received:  co2 45 and troponin 0.67  Date of notification:  12/07/2011   Time of notification:  1500  Critical value read back: yes  Nurse who received alert:  Lateisha Thurlow, Blanchard Kelch   MD notified (1st page): davis, novlet  Time of first page:  1515  MD notified (2nd page):  Time of second page:  Responding MD:  Molli Posey  Time MD responded: 1518  Orders received and enacted. Will continue to monitor.  Rahma Meller, Blanchard Kelch

## 2011-12-07 NOTE — ED Notes (Signed)
Restarted Heparin at 900 units per hour after physician cleared head CT.

## 2011-12-07 NOTE — H&P (Signed)
PCP:   [name], MD, MD   Chief Complaint: Hip pain   HPI: Admire is an 71 y.o. female with history of metastatic lung cancer, history of breast cancer, status post left hip fracture, recent right hip surgery for pathological disease from bone metastases 3 weeks ago, history of prior chemotherapy and current radiation therapy, hypertension, status post right upper chest Port-A-Cath placement, present to the emergency room mainly because of persistent pain in her right hip. Evaluation in emergency room included a CT pulmonary angiogram because of her tachycardia and hypoxemia, which showed bilateral pulmonary embolism, with possible pulmonary infarct along with pulmonary mass and adenopathy. She also found to have severe hypokalemia and with potassium of 2.6, and normal creatinine along with normal hemoglobin. Review of her systems is also significant for acute right upper extremity weakness that was rather pronounced this morning. This was an acute change for her. In the emergency room there was residual weakness but she had improvement where she could lift her right arm where she was not able to do so earlier today. There's been no slurred speech, facial droop or lower extremity weakness. She denied any palpitation or chest pain, but admitted to having her baseline shortness of breath. Intravenous heparin was ordered, not yet given, and hospitalist was asked to admit patient for pulmonary embolism in the setting of possible neurological event, such as TIA/CVA or even cerebral metastases. She was also noted to have elevated troponin to 0.54 with EKG showed no acute ST-T changes.  Rewiew of Systems:  The patient denies anorexia, fever, weight loss,, vision loss, decreased hearing, hoarseness, chest pain, syncope,  peripheral edema, balance deficits, hemoptysis, abdominal pain, melena, hematochezia, severe indigestion/heartburn, hematuria, incontinence, genital sores, muscle weakness,  suspicious skin lesions, transient blindness, difficulty walking, depression, unusual weight change, abnormal bleeding, enlarged lymph nodes, angioedema,   Past Medical History  Diagnosis Date  . Hypertension   . Heart murmur   . Hip fracture, left   . COPD (chronic obstructive pulmonary disease)   . Skin cancer   . Lung cancer 09/18/2005    rul =[facility] dx  . Cancer 11/01/11     mets rightfemur//iliac 5.6cm,&left iliac  . Skin cancer   . 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 Scoliosis 11/01/11    l3  . History of chemotherapy     6 cycles carboplatin/paclitaxel,last dose 08/2006,again given 07/22/2010,alimta q 3weeks s/p 9 cycless  . History of radiation therapy 08/10/2007-09/07/2007    right paratracheal area/ Dr.Kinard, [place]  . History of radiation therapy 2007    bilateral sterotatic radiotherapy Dr[facility]McMullen at [facility] completed 11/25/2005  . Anemia     Past Surgical History  Procedure Date  . Portacath placement   . Hip surgery     LEFT HIP  . Femur im nail 11/13/2011    Procedure: INTRAMEDULLARY (IM) NAIL FEMORAL;  Surgeon: Senaida Lange, MD;  Location: [facility] OR;  Service: Orthopedics;  Laterality: Right;  IM NAILING OF RIGHT HIP    Medications:  HOME MEDS: Prior to Admission medications   Medication Sig Start Date End Date Taking? Authorizing Provider  bisoprolol (ZEBETA) 5 MG tablet Take 2.5 mg by mouth daily.    Yes Historical Provider, MD  folic acid (FOLVITE) 1 MG tablet Take 1 mg by mouth daily.     Yes Historical Provider, MD  furosemide (LASIX) 20 MG tablet Take 20 mg by mouth 2 (two) times daily as needed. For  edema   Yes Lonie Peak, PA  lisinopril (PRINIVIL,ZESTRIL) 20 MG tablet Take 20 mg by mouth daily.     Yes Lonie Peak, PA  naproxen sodium (ANAPROX) 220 MG tablet Take 220 mg by mouth daily as needed. For pain   Yes Historical Provider, MD  oxyCODONE-acetaminophen (PERCOCET) 5-325 MG per tablet Take 1 tablet by mouth every 4 (four)  hours as needed. take 1-2 every 4-6hrs 11/13/11  Yes Senaida Lange, MD  tiotropium (SPIRIVA) 18 MCG inhalation capsule Place 18 mcg into inhaler and inhale daily.   Yes Historical Provider, MD  dexamethasone (DECADRON) 4 MG tablet Take 4 mg by mouth as directed. Take 1 tablet twice daily the day before,the day of and the day after chemo    Si Gaul, MD     Allergies:  Allergies  Allergen Reactions  . Other Hives    Cheap jewelry.    Social History:   reports that she has been smoking Cigarettes.  She has a 25 pack-year smoking history. She does not have any smokeless tobacco history on file. She reports that she does not drink alcohol or use illicit drugs.  Family History: Family History  Problem Relation Age of Onset  . Breast cancer Paternal Grandmother   . Prostate cancer Maternal Uncle     brainand colon ca  . Heart attack Mother 71     Physical Exam: Filed Vitals:   12/07/11 0110 12/07/11 0114 12/07/11 0309 12/07/11 0500  BP: 138/85  147/90   Pulse: 92  99   Temp: 98.4 F (36.9 C)  98.6 F (37 C)   TempSrc: Oral  Oral   Resp: 20  18   Height:    5' 4.17" (1.63 m)  Weight:    57.1 kg (125 lb 14.1 oz)  SpO2: 91% 84% 99%    Blood pressure 147/90, pulse 99, temperature 98.6 F (37 C), temperature source Oral, resp. rate 18, height 5' 4.17" (1.63 m), weight 57.1 kg (125 lb 14.1 oz), SpO2 99.00%.  GEN:  Pleasant person lying in the stretcher in no acute distress; cooperative with exam PSYCH:  alert and oriented x4; does not appear anxious or depressed; affect is appropriate. HEENT: Mucous membranes pink and anicteric; PERRLA; EOM intact; no cervical lymphadenopathy nor thyromegaly or carotid bruit; no JVD; Breasts:: Not examined CHEST WALL: No tenderness, right upper chest Port-A-Cath CHEST: Normal respiration, decreased breath sound, no wheezes or rales HEART: Regular rate and rhythm; no murmurs rubs or gallops BACK: No kyphosis or scoliosis; no CVA  tenderness ABDOMEN: Obese, soft non-tender; no masses, no organomegaly, normal abdominal bowel sounds; no pannus; no intertriginous candida. Rectal Exam: Not done EXTREMITIES: No bone or joint deformity; age-appropriate arthropathy of the hands and knees; 2+ edema, pitting, bilaterally; no ulcerations. Genitalia: not examined PULSES: 2+ and symmetric SKIN: Normal hydration no rash or ulceration CNS: Cranial nerves 2-12 grossly intact no focal lateralizing neurologic deficit. Hand grasp is weak. Speech is fluent, tongue is midline, uvula elevated with phonation, facial symmetry.   Labs & Imaging Results for orders placed during the hospital encounter of 12/07/11 (from the past 48 hour(s))  CBC     Status: Abnormal   Collection Time   12/07/11  2:31 AM      Component Value Range Comment   WBC 8.1  4.0 - 10.5 (K/uL)    RBC 3.76 (*) 3.87 - 5.11 (MIL/uL)    Hemoglobin 11.4 (*) 12.0 - 15.0 (g/dL)    HCT 36.6  36.0 - 46.0 (%)    MCV 97.3  78.0 - 100.0 (fL)    MCH 30.3  26.0 - 34.0 (pg)    MCHC 31.1  30.0 - 36.0 (g/dL)    RDW 16.1 (*) 09.6 - 15.5 (%)    Platelets 196  150 - 400 (K/uL)   DIFFERENTIAL     Status: Abnormal   Collection Time   12/07/11  2:31 AM      Component Value Range Comment   Neutrophils Relative 78 (*) 43 - 77 (%)    Neutro Abs 6.2  1.7 - 7.7 (K/uL)    Lymphocytes Relative 10 (*) 12 - 46 (%)    Lymphs Abs 0.8  0.7 - 4.0 (K/uL)    Monocytes Relative 9  3 - 12 (%)    Monocytes Absolute 0.7  0.1 - 1.0 (K/uL)    Eosinophils Relative 3  0 - 5 (%)    Eosinophils Absolute 0.3  0.0 - 0.7 (K/uL)    Basophils Relative 0  0 - 1 (%)    Basophils Absolute 0.0  0.0 - 0.1 (K/uL)   PRO B NATRIURETIC PEPTIDE     Status: Abnormal   Collection Time   12/07/11  2:33 AM      Component Value Range Comment   Pro B Natriuretic peptide (BNP) 11771.0 (*) 0 - 125 (pg/mL)   POCT I-STAT TROPONIN I     Status: Abnormal   Collection Time   12/07/11  2:50 AM      Component Value Range Comment    Troponin i, poc 0.52 (*) 0.00 - 0.08 (ng/mL)    Comment NOTIFIED PHYSICIAN      Comment 3            POCT I-STAT, CHEM 8     Status: Abnormal   Collection Time   12/07/11  2:53 AM      Component Value Range Comment   Sodium 139  135 - 145 (mEq/L)    Potassium 2.6 (*) 3.5 - 5.1 (mEq/L)    Chloride 89 (*) 96 - 112 (mEq/L)    BUN 15  6 - 23 (mg/dL)    Creatinine, Ser 0.45  0.50 - 1.10 (mg/dL)    Glucose, Bld 409 (*) 70 - 99 (mg/dL)    Calcium, Ion 8.11  1.12 - 1.32 (mmol/L)    TCO2 38  0 - 100 (mmol/L)    Hemoglobin 12.9  12.0 - 15.0 (g/dL)    HCT 91.4  78.2 - 95.6 (%)    Comment NOTIFIED PHYSICIAN      Dg Chest 1 View  12/07/2011  *RADIOLOGY REPORT*  Clinical Data: Fall.  History of lung cancer.  CHEST - 1 VIEW  Comparison: Chest 11/11/2011.  The CT chest 11/26/2011.  Findings: Cardiac enlargement.  Normal pulmonary vascularity. Emphysematous changes in the lungs with scattered fibrosis.  Focal scarring in the right upper lung may represent postradiation change.  Spiculated appearing nodular opacities in both hilar regions may be related to scarring but metastasis is not excluded. No focal airspace consolidation.  No blunting of costophrenic angles.  No pneumothorax.  Degenerative changes and scoliosis of the spine.  Calcification of the aorta.  Right power port type central venous catheter with tip over the mid SVC region.  IMPRESSION: Cardiac enlargement.  Emphysema and fibrosis in the lungs. Scarring bilaterally.  Spiculated appearing nodular opacities in both hilar regions.  No active infiltration.  Original Report Authenticated By: Marlon Pel, M.D.  Dg Femur Right  12/07/2011  *RADIOLOGY REPORT*  Clinical Data: Pain after fall.  RIGHT FEMUR - 2 VIEW  Comparison: 11/13/2011  Findings: Postoperative changes in the right hip with intramedullary rod and screw and compression screw fixation.  No evidence of acute fracture or displacement.  No focal bone lesion or bone destruction.  Bone  cortex and trabecular architecture appear intact.  Vascular calcifications.  Degenerative changes in the knee.  No significant change in positioning of hardware since previous study.  IMPRESSION: Postoperative changes with internal fixation of the proximal right femur.  No acute bony abnormalities identified.  Original Report Authenticated By: Marlon Pel, M.D.   Ct Angio Chest W/cm &/or Wo Cm  12/07/2011  **ADDENDUM** CREATED: 12/07/2011 05:40:23  The esophagus is mildly distended with an air-fluid level suggesting dysmotility.  **END ADDENDUM** SIGNED BY: Marlon Pel, M.D.   12/07/2011  *RADIOLOGY REPORT*  Clinical Data: Short of breath.  History of lung cancer and COPD. Fall.  CT ANGIOGRAPHY CHEST  Technique:  Multidetector CT imaging of the chest using the standard protocol during bolus administration of intravenous contrast. Multiplanar reconstructed images including MIPs were obtained and reviewed to evaluate the vascular anatomy.  Contrast: OMNIPAQUE IOHEXOL 350 MG/ML SOLN  Comparison: 11/26/2011  Findings: Technically adequate study with good opacification of the central and segmental pulmonary arteries.  There are focal intraluminal filling defects in the scattered bilateral segmental branches consistent with pulmonary emboli.  Cardiac enlargement.  Calcification of the aortic valve and coronary arteries.  Calcification and ectasia of the aorta. Scarring in the mid lungs bilaterally with mass or adenopathy in the right hilum measuring about 15 mm diameter.  Pretracheal lymphadenopathy measures 12 mm short axis dimension.  Multiple pulmonary nodules are again demonstrated bilaterally consistent with metastatic disease.  Probably the largest is a spiculated nodule in the right upper lung measuring 8 mm diameter. Visualization of the lungs is limited due to respiratory motion artifact.  Diffuse emphysematous changes and fibrosis throughout the lungs.  Atelectasis in the lung bases.  Focal  wedge-shaped consolidation in the right lung base posteriorly may represent a focal infarct.  Bronchiectasis and mucus plugging in the lung bases with peribronchial thickening consistent with chronic bronchitis. Bilateral adrenal gland nodules measuring 2.6 cm on the left and 3.3 cm on the right.  These demonstrate low density measurements suggesting adenomas.  Degenerative changes in the thoracic spine. No destructive bone lesions appreciated. Minimal right pleural effusion.  IMPRESSION: Positive study for multiple bilateral segmental pulmonary emboli. Possible small peripheral infarct in the right lung base posteriorly.  Again demonstrated are multiple diffuse bilateral pulmonary metastases, right hilar mass, diffuse emphysematous changes, and fibrosis. Chronic bronchitic changes.  Critical Value/emergent results were called by telephone at the time of interpretation on 12/07/2011  at 0440 hours  to  Dr. Hyacinth Meeker, who verbally acknowledged these results. Cardiac enlargement.  Bilateral adrenal gland nodules.  Original Report Authenticated By: Marlon Pel, M.D.      Assessment: #1 multiple bilateral pulmonary embolism #2 recent right hip surgery #3 TIA/CVA (rule out intracerebral metastases) #4 severe hypokalemia #5 metastatic lung cancer #6 COPD #7 elevated troponin #8 CODE STATUS DO NOT RESUSCITATE    PLAN:  Mrs. Alire presents with multiple pulmonary emboli, along with an acute neurological symptoms consistent with TIA. It is prudent to exclude an intracranial process prior to giving her aspirin along with full anticoagulation. She did have a CT pulmonary angiogram, and the contrast will decrease sensitivity of  a subarachnoid bleed, however with subdural or larger intraparenchymal bleed, a plain CT should detect it. As soon as a plain CT of her head is negative, she should proceed with full anticoagulation and aspirin. For further evaluation Will order MRI with contrast of the brain to  exclude metastatic disease. She will be given aggressive repletion of her potassium. Her elevated troponin is likely from the PE itself. We will continue cycling her cardiac markers. I did discuss CODE STATUS with the patient and she would like to be DO NOT RESUSCITATE. We will honor this. I also discussed possibility of filter placement, but she declined at this time. I also discussed risk of anticoagulation for her PE, especially in light that she has had neurological symptoms. She understood these risks and my fear, and would like to proceed with the plan outlined above. She maintain her hemodynamic stability and will be admitted to telemetry under triad hospitalist service.   Other plans as per orders.    Udell Mazzocco 12/07/2011, 6:03 AM

## 2011-12-07 NOTE — ED Notes (Signed)
I gave the patients visitor a cup of coffee. 

## 2011-12-07 NOTE — Progress Notes (Signed)
ANTICOAGULATION CONSULT NOTE - Follow Up Consult  Pharmacy Consult for Heparin Indication: pulmonary embolus  Allergies  Allergen Reactions  . Other Hives    Cheap jewelry.    Patient Measurements: Height: 5' 4.17" (163 cm) Weight: 125 lb 14.1 oz (57.1 kg) IBW/kg (Calculated) : 55.1  Heparin Dosing Weight:   Vital Signs: Temp: 99 F (37.2 C) (06/02 1447) Temp src: Oral (06/02 0919) BP: 92/64 mmHg (06/02 1447) Pulse Rate: 91  (06/02 1447)  Labs:  Basename 12/07/11 1418 12/07/11 1359 12/07/11 1358 12/07/11 1030 12/07/11 0253 12/07/11 0231  HGB -- -- -- -- 12.9 11.4*  HCT -- -- -- -- 38.0 36.6  PLT -- -- -- -- -- 196  APTT -- -- -- -- -- --  LABPROT -- -- -- 16.8* -- --  INR -- -- -- 1.34 -- --  HEPARINUNFRC <0.10* -- -- -- -- --  CREATININE -- 0.55 -- -- 0.90 --  CKTOTAL -- -- 53 -- -- --  CKMB -- -- 5.0* -- -- --  TROPONINI -- -- 0.67* -- -- --    Estimated Creatinine Clearance: 56.1 ml/min (by C-G formula based on Cr of 0.55).   Medications:  Scheduled:    . aspirin  325 mg Oral Daily  . bisoprolol  2.5 mg Oral Daily  . folic acid  1 mg Oral Daily  . heparin  3,500 Units Intravenous Once  . lisinopril  20 mg Oral Daily  .  morphine injection  4 mg Intravenous Once  .  morphine injection  4 mg Intravenous Once  . oxyCODONE-acetaminophen  2 tablet Oral Once  . potassium chloride  10 mEq Intravenous Once  . potassium chloride  40 mEq Oral Daily  . potassium chloride  40 mEq Oral Once  . sodium chloride  10-40 mL Intracatheter Q12H  . tiotropium  18 mcg Inhalation Daily  . DISCONTD: dexamethasone  4 mg Oral UD  . DISCONTD: heparin  5,000 Units Intravenous Once  . DISCONTD: potassium chloride  10 mEq Intravenous Q1 Hr x 4  . DISCONTD: potassium chloride  10 mEq Intravenous Q1 Hr x 4    Assessment: 71yo female with (+)B-PE, head CT (-)stroke.  Heparin level subtherapeutic today.  Verified that heparin is running at correct rate and there have been no issues  with the pump per c/w patient.  No other problems noted.  Goal of Therapy:  Heparin level 0.3-0.7 units/ml Monitor platelets by anticoagulation protocol: Yes   Plan:  1.  Heparin 1500 units IV and inc to 1100 units/hr 2.  Check HL in 8hr  Marisue Humble, PharmD Clinical Pharmacist [facility]

## 2011-12-08 ENCOUNTER — Ambulatory Visit: Payer: No Typology Code available for payment source

## 2011-12-08 ENCOUNTER — Telehealth: Payer: Self-pay

## 2011-12-08 DIAGNOSIS — I639 Cerebral infarction, unspecified: Secondary | ICD-10-CM | POA: Diagnosis present

## 2011-12-08 DIAGNOSIS — G459 Transient cerebral ischemic attack, unspecified: Secondary | ICD-10-CM

## 2011-12-08 DIAGNOSIS — Z85118 Personal history of other malignant neoplasm of bronchus and lung: Secondary | ICD-10-CM

## 2011-12-08 DIAGNOSIS — I6340 Cerebral infarction due to embolism of unspecified cerebral artery: Secondary | ICD-10-CM

## 2011-12-08 DIAGNOSIS — E876 Hypokalemia: Secondary | ICD-10-CM

## 2011-12-08 DIAGNOSIS — I2699 Other pulmonary embolism without acute cor pulmonale: Secondary | ICD-10-CM

## 2011-12-08 LAB — LIPID PANEL
Cholesterol: 131 mg/dL (ref 0–200)
HDL: 40 mg/dL (ref >39)
Triglycerides: 97 mg/dL (ref <150)

## 2011-12-08 LAB — CBC
Hemoglobin: 11.1 g/dL — ABNORMAL LOW (ref 12.0–15.0)
MCH: 30.3 pg (ref 26.0–34.0)
MCHC: 30.7 g/dL (ref 30.0–36.0)
Platelets: 190 10*3/uL (ref 150–400)
RBC: 3.66 MIL/uL — ABNORMAL LOW (ref 3.87–5.11)

## 2011-12-08 LAB — BASIC METABOLIC PANEL
CO2: 36 mEq/L — ABNORMAL HIGH (ref 19–32)
Calcium: 9.1 mg/dL (ref 8.4–10.5)
GFR calc non Af Amer: >90 mL/min (ref >90)
Potassium: 3.2 mEq/L — ABNORMAL LOW (ref 3.5–5.1)
Sodium: 136 mEq/L (ref 135–145)

## 2011-12-08 LAB — GLUCOSE, CAPILLARY
Glucose-Capillary: 167 mg/dL — ABNORMAL HIGH (ref 70–99)
Glucose-Capillary: 167 mg/dL — ABNORMAL HIGH (ref 70–99)
Glucose-Capillary: 141 mg/dL — ABNORMAL HIGH (ref 70–99)

## 2011-12-08 LAB — CARDIAC PANEL(CRET KIN+CKTOT+MB+TROPI)
Relative Index: INVALID (ref 0.0–2.5)
Troponin I: 0.31 ng/mL (ref <0.30)

## 2011-12-08 LAB — HEPARIN LEVEL (UNFRACTIONATED): Heparin Unfractionated: 0.4 IU/mL (ref 0.30–0.70)

## 2011-12-08 MED ORDER — HEPARIN (PORCINE) IN NACL 100-0.45 UNIT/ML-% IJ SOLN
1100.0000 [IU]/h | INTRAMUSCULAR | Status: DC
  Administered 2011-12-09: 1100 [IU]/h via INTRAVENOUS
  Filled 2011-12-08 (×2): qty 250

## 2011-12-08 MED ORDER — POTASSIUM CHLORIDE CRYS ER 20 MEQ PO TBCR
40.0000 meq | EXTENDED_RELEASE_TABLET | ORAL | Status: AC
  Administered 2011-12-08 (×2): 40 meq via ORAL
  Filled 2011-12-08 (×2): qty 2

## 2011-12-08 NOTE — Progress Notes (Signed)
VASCULAR LAB PRELIMINARY  PRELIMINARY  PRELIMINARY  PRELIMINARY  Carotid Doppler completed.    Preliminary report:  There is no significant ICA stenosis.  Vertebral artery flow is antegrade.  Sherren Kerns [place], 12/08/2011, 10:16 AM

## 2011-12-08 NOTE — Progress Notes (Signed)
[facility] PROGRESS NOTE  [name] ZOX:[id number] DOB: May 23, 1941 DOA: 12/07/2011 PCP: [name], MD, MD  Assessment/Plan: CVA (cerebral infarction) Patient with acute CVA on MRI. She was not on aspirin before. Start her on aspirin 325 mg daily. Continue her on heparin for the PE. Will not change to Lovenox secondary to the possibility of hemorrhagic conversion. Will defer to neurology the timeframe for transition to Lovenox.   Malignant neoplasm of bronchus and lung, unspecified site/Secondary malignant neoplasm of bone and bone marrow Discussed with Dr. her Shirline Frees. No intervention at this time.   COPD (chronic obstructive pulmonary disease) Patient has COPD but was not on oxygen at home. Continue oxygen and nebulizer when necessary. Patient is a CO2 retainer so will keep her nasal cannula at 2 L.    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 Will ask physical therapy to see her while she is in-house will also get occupational therapy secondary to her right upper extremity weakness.   Pulmonary embolism As mentioned above will continue heparin.  Patient can be transitioned to Lovenox as soon as the risk of hemorrhagic conversion decreases  ?48-72 hours hours.   Hypokalemia Replete potassium.  Code Status: DNR Family Communication: None Disposition Plan: Not yet determined  Molli Posey, MD  [facility] Pager [phone]  If 8PM-8AM, please contact night-coverage [email or web address] Password [email or web address] 12/08/2011, 10:35 AM   LOS: 1 day   Brief narrative: None  Consultants:  Neurology: Dr. Roseanne Reno  Oncology: Dr. Jerolyn Center notified  Procedures:  None  Antibiotics:  None ?  HPI/Subjective: Patient feels fine today. She feels that her right upper extremity is stronger than yesterday.  Objective: Filed Vitals:   12/08/11 0426 12/08/11 0432 12/08/11 0800  12/08/11 0823  BP: 123/77  139/88   Pulse: 83  94   Temp: 97.7 F (36.5 C)  98.4 F (36.9 C)   TempSrc: Oral  Oral   Resp: 18  18   Height:      Weight:      SpO2: 91% 91% 91% 89%    Intake/Output Summary (Last 24 hours) at 12/08/11 1035 Last data filed at 12/08/11 0432  Gross per 24 hour  Intake    480 ml  Output   1050 ml  Net   -570 ml    Exam:   General: Patient is laying in bed. She did does not seem to be in any acute distress.   Cardiovascular: Regular rate rhythm  Respiratory: Clear to auscultation bilaterally  Extremities: 1+ edema  Data Reviewed: Basic Metabolic Panel:  Lab 12/08/11 8295 12/07/11 1359 12/07/11 1005 12/07/11 0253  NA 136 141 -- 139  K 3.2* 3.1* -- --  CL 92* 95* -- 89*  CO2 36* >45* -- --  GLUCOSE 118* 144* -- 119*  BUN 14 14 -- 15  CREATININE 0.51 0.55 -- 0.90  CALCIUM 9.1 9.2 -- --  MG -- -- 1.6 --  PHOS -- -- -- --   CBC:  Lab 12/08/11 0600 12/07/11 0253 12/07/11 0231  WBC 11.3* -- 8.1  NEUTROABS -- -- 6.2  HGB 11.1* 12.9 11.4*  HCT 36.1 38.0 36.6  MCV 98.6 -- 97.3  PLT 190 -- 196   Cardiac Enzymes:  Lab 12/08/11 0600 12/07/11 2143 12/07/11 1358  CKTOTAL 34 41 53  CKMB 3.2 4.0 5.0*  CKMBINDEX -- -- --  TROPONINI 0.31* 0.50* 0.67*   CBG:  Lab 12/07/11 2118  GLUCAP 118*    Studies: Ct Abdomen Pelvis Wo Contrast  11/26/2011  *RADIOLOGY REPORT*  Clinical Data:  History of lung cancer.  Abdominal pain and nausea.  CT CHEST, ABDOMEN AND PELVIS WITHOUT CONTRAST  Technique:  Multidetector CT imaging of the chest, abdomen and pelvis was performed following the standard protocol without IV contrast.  Comparison:  Prior CT scan 08/21/2011.  CT CHEST  Findings:  The chest wall is unremarkable and stable.  The right- sided Port-A-Cath is stable.  No breast masses, supraclavicular or axillary adenopathy.  The thyroid gland appears normal.  Dense vascular calcifications.  The bony thorax is intact.  Stable osteoporosis.  The heart  is mildly enlarged but stable.  No pericardial effusion. Stable dense aortic and coronary artery calcifications.  No mediastinal or hilar lymphadenopathy.  The esophagus is grossly normal.  Stable emphysematous changes in the lungs.  Stable radiation changes involving the paramediastinal lungs. There are new and enlarging bilateral pulmonary nodules consistent with metastatic pulmonary disease.  No pleural effusions or pulmonary edema.  IMPRESSION:  1. New and enlarging pulmonary nodules consistent with pulmonary metastatic disease. 2.  Stable radiation changes. 3.  Stable emphysematous changes.  CT ABDOMEN AND PELVIS  Findings:  The liver is grossly normal without contrast.  No obvious metastatic lesions.  No biliary dilatation.  The gallbladder is slightly contracted.  The pancreas is normal.  The spleen is normal in size.  No focal lesions.  Stable bilateral adrenal gland masses consistent with benign adenomas.  No renal lesions.  The stomach, duodenum, small bowel and colon are grossly normal. No mesenteric or retroperitoneal masses or adenopathy.  Stable small scattered lymph nodes.  The aorta demonstrates advanced atherosclerotic calcifications as to the branch vessels.  A remote chronic distal aortic dissection is noted.  There is a stable left adnexal cysts with benign CT imaging features.  No pelvic mass, adenopathy or free pelvic fluid collections.  No inguinal mass or hernia.  Examination of the bony structures demonstrates an enlarging lytic lesion involving the right iliac bone with cortical breakthrough. There is also a destructive lytic lesion involving the right femoral neck with associated soft tissue mass.  Subsequent placement of a femoral rod.  The lumbar vertebral bodies are intact.  IMPRESSION:  1.  Stable bilateral adrenal gland adenomas. 2.  Stable left adnexal cyst. 3.  Progressive lytic bone lesion involving the right ilium with associated destructive bony change. 4.  Interval placement of  a femoral rod across the pathologic lesion near the lesser trochanter. 5.  No CT findings for metastatic hepatic disease or abdominal/pelvic adenopathy.  Original Report Authenticated By: P. Loralie Champagne, M.D.   Dg Chest 1 View  12/07/2011  *RADIOLOGY REPORT*  Clinical Data: Fall.  History of lung cancer.  CHEST - 1 VIEW  Comparison: Chest 11/11/2011.  The CT chest 11/26/2011.  Findings: Cardiac enlargement.  Normal pulmonary vascularity. Emphysematous changes in the lungs with scattered fibrosis.  Focal scarring in the right upper lung may represent postradiation change.  Spiculated appearing nodular opacities in both hilar regions may be related to scarring but metastasis is not excluded. No focal airspace consolidation.  No blunting of costophrenic angles.  No pneumothorax.  Degenerative changes and scoliosis of the spine.  Calcification of the aorta.  Right power port type central venous catheter with tip over the mid SVC region.  IMPRESSION: Cardiac enlargement.  Emphysema and fibrosis in the lungs. Scarring bilaterally.  Spiculated appearing nodular opacities in both hilar regions.  No active infiltration.  Original Report Authenticated By: Marlon Pel, M.D.   Dg Chest 2 View  11/11/2011  *RADIOLOGY REPORT*  Clinical Data: Preop radiograph.  CHEST - 2 VIEW  Comparison: 08/21/2011  Findings: There is a right chest wall porta-catheter with tip in the cavoatrial junction.  Heart size is normal.  Chronic, bilateral upper lobe opacities perihilar opacities are identified and appear similar to previous exam.  No superimposed acute airspace consolidation identified.  No pleural effusion or edema.  There is a marked scoliosis deformity involving the thoracic and lumbar spine.  IMPRESSION:  1.  Stable upper lobe perihilar scarring 2.  No acute cardiopulmonary abnormalities  Original Report Authenticated By: Rosealee Albee, M.D.   Dg Hip Operative Right  11/13/2011  *RADIOLOGY REPORT*  Clinical Data:  Hip lesion  DG OPERATIVE RIGHT HIP  Comparison: 11/01/2011  Findings: Three spot images demonstrate a dynamic compression screw and intramedullary rod transfixed in the proximal right femur.  One distal interlocking screw.  Anatomic alignment.  No breakage or loosening of the hardware.  IMPRESSION: Internal fixation proximal right femur.  Original Report Authenticated By: Donavan Burnet, M.D.   Dg Femur Right  12/07/2011  *RADIOLOGY REPORT*  Clinical Data: Pain after fall.  RIGHT FEMUR - 2 VIEW  Comparison: 11/13/2011  Findings: Postoperative changes in the right hip with intramedullary rod and screw and compression screw fixation.  No evidence of acute fracture or displacement.  No focal bone lesion or bone destruction.  Bone cortex and trabecular architecture appear intact.  Vascular calcifications.  Degenerative changes in the knee.  No significant change in positioning of hardware since previous study.  IMPRESSION: Postoperative changes with internal fixation of the proximal right femur.  No acute bony abnormalities identified.  Original Report Authenticated By: Marlon Pel, M.D.   Ct Head Without Contrast  12/07/2011  *RADIOLOGY REPORT*  Clinical Data: Right arm weakness  CT HEAD WITHOUT CONTRAST  Technique:  Contiguous axial images were obtained from the base of the skull through the vertex without contrast.  Comparison: 08/05/2010  Findings: Stable mild brain atrophy most pronounced in the frontal lobes.  No acute intracranial hemorrhage, definite mass lesion, infarction, midline shift, herniation, or hydrocephalus.  Gray- white matter differentiation maintained.  Cisterns patent.  No cerebellar abnormality.  Visualized mastoids and sinuses clear.  IMPRESSION: Stable exam.  No acute intracranial process by noncontrast CT  Original Report Authenticated By: Judie Petit. Ruel Favors, M.D.   Ct Chest Wo Contrast  11/26/2011  *RADIOLOGY REPORT*  Clinical Data:  History of lung cancer.  Abdominal pain and  nausea.  CT CHEST, ABDOMEN AND PELVIS WITHOUT CONTRAST  Technique:  Multidetector CT imaging of the chest, abdomen and pelvis was performed following the standard protocol without IV contrast.  Comparison:  Prior CT scan 08/21/2011.  CT CHEST  Findings:  The chest wall is unremarkable and stable.  The right- sided Port-A-Cath is stable.  No breast masses, supraclavicular or axillary adenopathy.  The thyroid gland appears normal.  Dense vascular calcifications.  The bony thorax is intact.  Stable osteoporosis.  The heart is mildly enlarged but stable.  No pericardial effusion. Stable dense aortic and coronary artery calcifications.  No mediastinal or hilar lymphadenopathy.  The esophagus is grossly normal.  Stable emphysematous changes in the lungs.  Stable radiation changes involving the paramediastinal lungs. There are new and enlarging bilateral pulmonary nodules consistent with metastatic pulmonary disease.  No pleural effusions or pulmonary edema.  IMPRESSION:  1.  New and enlarging pulmonary nodules consistent with pulmonary metastatic disease. 2.  Stable radiation changes. 3.  Stable emphysematous changes.  CT ABDOMEN AND PELVIS  Findings:  The liver is grossly normal without contrast.  No obvious metastatic lesions.  No biliary dilatation.  The gallbladder is slightly contracted.  The pancreas is normal.  The spleen is normal in size.  No focal lesions.  Stable bilateral adrenal gland masses consistent with benign adenomas.  No renal lesions.  The stomach, duodenum, small bowel and colon are grossly normal. No mesenteric or retroperitoneal masses or adenopathy.  Stable small scattered lymph nodes.  The aorta demonstrates advanced atherosclerotic calcifications as to the branch vessels.  A remote chronic distal aortic dissection is noted.  There is a stable left adnexal cysts with benign CT imaging features.  No pelvic mass, adenopathy or free pelvic fluid collections.  No inguinal mass or hernia.  Examination  of the bony structures demonstrates an enlarging lytic lesion involving the right iliac bone with cortical breakthrough. There is also a destructive lytic lesion involving the right femoral neck with associated soft tissue mass.  Subsequent placement of a femoral rod.  The lumbar vertebral bodies are intact.  IMPRESSION:  1.  Stable bilateral adrenal gland adenomas. 2.  Stable left adnexal cyst. 3.  Progressive lytic bone lesion involving the right ilium with associated destructive bony change. 4.  Interval placement of a femoral rod across the pathologic lesion near the lesser trochanter. 5.  No CT findings for metastatic hepatic disease or abdominal/pelvic adenopathy.  Original Report Authenticated By: P. Loralie Champagne, M.D.   Ct Angio Chest W/cm &/or Wo Cm  12/07/2011  **ADDENDUM** CREATED: 12/07/2011 05:40:23  The esophagus is mildly distended with an air-fluid level suggesting dysmotility.  **END ADDENDUM** SIGNED BY: Marlon Pel, M.D.   12/07/2011  *RADIOLOGY REPORT*  Clinical Data: Short of breath.  History of lung cancer and COPD. Fall.  CT ANGIOGRAPHY CHEST  Technique:  Multidetector CT imaging of the chest using the standard protocol during bolus administration of intravenous contrast. Multiplanar reconstructed images including MIPs were obtained and reviewed to evaluate the vascular anatomy.  Contrast: OMNIPAQUE IOHEXOL 350 MG/ML SOLN  Comparison: 11/26/2011  Findings: Technically adequate study with good opacification of the central and segmental pulmonary arteries.  There are focal intraluminal filling defects in the scattered bilateral segmental branches consistent with pulmonary emboli.  Cardiac enlargement.  Calcification of the aortic valve and coronary arteries.  Calcification and ectasia of the aorta. Scarring in the mid lungs bilaterally with mass or adenopathy in the right hilum measuring about 15 mm diameter.  Pretracheal lymphadenopathy measures 12 mm short axis dimension.   Multiple pulmonary nodules are again demonstrated bilaterally consistent with metastatic disease.  Probably the largest is a spiculated nodule in the right upper lung measuring 8 mm diameter. Visualization of the lungs is limited due to respiratory motion artifact.  Diffuse emphysematous changes and fibrosis throughout the lungs.  Atelectasis in the lung bases.  Focal wedge-shaped consolidation in the right lung base posteriorly may represent a focal infarct.  Bronchiectasis and mucus plugging in the lung bases with peribronchial thickening consistent with chronic bronchitis. Bilateral adrenal gland nodules measuring 2.6 cm on the left and 3.3 cm on the right.  These demonstrate low density measurements suggesting adenomas.  Degenerative changes in the thoracic spine. No destructive bone lesions appreciated. Minimal right pleural effusion.  IMPRESSION: Positive study for multiple bilateral segmental pulmonary emboli. Possible small peripheral infarct in the  right lung base posteriorly.  Again demonstrated are multiple diffuse bilateral pulmonary metastases, right hilar mass, diffuse emphysematous changes, and fibrosis. Chronic bronchitic changes.  Critical Value/emergent results were called by telephone at the time of interpretation on 12/07/2011  at 0440 hours  to  Dr. Hyacinth Meeker, who verbally acknowledged these results. Cardiac enlargement.  Bilateral adrenal gland nodules.  Original Report Authenticated By: Marlon Pel, M.D.   Mr Laqueta Jean Wo Contrast  12/07/2011  *RADIOLOGY REPORT*  Clinical Data: 71 year old female with metastatic cancer.  Right upper extremity weakness.  MRI HEAD WITHOUT AND WITH CONTRAST  Technique:  Multiplanar, multiecho pulse sequences of the brain and surrounding structures were obtained according to standard protocol without and with intravenous contrast  Contrast: 10mL MULTIHANCE GADOBENATE DIMEGLUMINE 529 MG/ML IV SOLN  Comparison: Head CTs 12/07/2011 and earlier.  Findings:  Multiple small foci of restricted diffusion scattered in the right hemisphere, including the frontal lobe (left upper extremity motor cortex regions series 4 image 18), parietal lobe, and also a fairly dense involvement in the left occipital pole (series 4 image 7).  Additionally, there is a wedge-shaped infarct with restricted diffusion in the left cerebellum (image 4).  Also, there are two suspicious foci of possible diffusion restriction in the right hemisphere, pre motor cortex on image 20 and possible focal right occipital lobe cortex involvement on image 80.  None of these areas show evidence of hemorrhage or mass effect. Major intracranial vascular flow voids are preserved.  No ventriculomegaly.  No midline shift.  No abnormal enhancement or mass identified in the brain.  Chronic lacunar infarcts occasionally noted in the deep gray matter nuclei and cerebellum.  Overall normal cerebral volume. Negative pituitary and cervicomedullary junction.  Visualized bone marrow signal is within normal limits. Degenerative changes and cervical spine. Visualized orbit soft tissues are within normal limits.  Visualized paranasal sinuses and mastoids are clear.  Negative scalp soft tissues.  IMPRESSION: 1.  Multiple scattered small acute infarcts in the left hemisphere involving both the MCA and PCA territories.  Left upper extremity motor cortex region is affected. 2.  Small wedge-shaped acute infarct also in the left cerebellum. 3.  Suggestion of several punctate infarcts also in the right hemisphere.  This constellation raise the possibility of recent embolic event. 4.  No mass effect or hemorrhage.  Mild for age underlying chronic small vessel ischemia. 5.  No metastatic disease identified.  Original Report Authenticated By: Harley Hallmark, M.D.    Scheduled Meds:   . aspirin  325 mg Oral Daily  . bisoprolol  2.5 mg Oral Daily  . folic acid  1 mg Oral Daily  . heparin  1,500 Units Intravenous Once  . lisinopril   20 mg Oral Daily  . potassium chloride  40 mEq Oral Daily  . potassium chloride  40 mEq Oral Once  . sodium chloride  10-40 mL Intracatheter Q12H  . tiotropium  18 mcg Inhalation Daily   Continuous Infusions:   . heparin    . DISCONTD: heparin 1,100 Units/hr (12/08/11 0432)

## 2011-12-08 NOTE — Progress Notes (Signed)
[facility] CONSULT NOTE     Reason for Consult: stroke    HPI:    [name] is an 71 y.o. female history of metastatic lung cancer, history of breast cancer,  left hip fracture, recent right hip surgery for pathological disease from bone metastases, history of prior chemotherapy and current radiation therapy, hypertension, status post right upper chest Port-A-Cath placement who presented to the emergency room initially for persistent pain in her right hip and weakness in right upper extremity. While in the ED evaluation a CT pulmonary angiogram was obtained due to tachycardia and hypoxemia, which showed bilateral pulmonary embolism, with possible pulmonary infarct. MRI of brain showed multiple scattered small acute infarcts in the left hemisphere involving both the MCA and PCA territories, small wedge-shaped acute infarct also in the left cerebellum, several punctate infarcts also in the right hemisphere.      Past Medical History  Diagnosis Date  . Hypertension   . Heart murmur   . Hip fracture, left   . COPD (chronic obstructive pulmonary disease)   . Skin cancer   . Lung cancer 09/18/2005    rul =[facility] dx  . Cancer 11/01/11     mets rightfemur//iliac 5.6cm,&left iliac  . Skin cancer   . 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 Scoliosis 11/01/11    l3  . History of chemotherapy     6 cycles carboplatin/paclitaxel,last dose 08/2006,again given 07/22/2010,alimta q 3weeks s/p 9 cycless  . History of radiation therapy 08/10/2007-09/07/2007    right paratracheal area/ Dr.Kinard, [place]  . History of radiation therapy 2007    bilateral sterotatic radiotherapy Dr[facility]McMullen at [facility] completed 11/25/2005  . Anemia     Past Surgical History  Procedure Date  . Portacath placement   . Hip surgery     LEFT HIP  . Femur im nail 11/13/2011    Procedure: INTRAMEDULLARY (IM) NAIL FEMORAL;  Surgeon: Senaida Lange, MD;  Location:  [facility] OR;  Service: Orthopedics;  Laterality: Right;  IM NAILING OF RIGHT HIP    Family History  Problem Relation Age of Onset  . Breast cancer Paternal Grandmother   . Prostate cancer Maternal Uncle     brainand colon ca  . Heart attack Mother 44    Social History:  reports that she has been smoking Cigarettes.  She has a 25 pack-year smoking history. She does not have any smokeless tobacco history on file. She reports that she does not drink alcohol or use illicit drugs.  Allergies  Allergen Reactions  . Other Hives    Cheap jewelry.    Medications:    Prior to Admission:  Prescriptions prior to admission  Medication Sig Dispense Refill  . bisoprolol (ZEBETA) 5 MG tablet Take 2.5 mg by mouth daily.       . folic acid (FOLVITE) 1 MG tablet Take 1 mg by mouth daily.        . furosemide (LASIX) 20 MG tablet Take 20 mg by mouth 2 (two) times daily as needed. For edema      . lisinopril (PRINIVIL,ZESTRIL) 20 MG tablet Take 20 mg by mouth daily.        . naproxen sodium (ANAPROX) 220 MG tablet Take 220 mg by mouth daily as needed. For pain      . oxyCODONE-acetaminophen (PERCOCET) 5-325 MG  per tablet Take 1 tablet by mouth every 4 (four) hours as needed. take 1-2 every 4-6hrs      . tiotropium (SPIRIVA) 18 MCG inhalation capsule Place 18 mcg into inhaler and inhale daily.      Marland Kitchen dexamethasone (DECADRON) 4 MG tablet Take 4 mg by mouth as directed. Take 1 tablet twice daily the day before,the day of and the day after chemo       Scheduled:   . aspirin  325 mg Oral Daily  . bisoprolol  2.5 mg Oral Daily  . folic acid  1 mg Oral Daily  . heparin  1,500 Units Intravenous Once  . lisinopril  20 mg Oral Daily  . potassium chloride  40 mEq Oral Once  . potassium chloride  40 mEq Oral Q4H  . sodium chloride  10-40 mL Intracatheter Q12H  . tiotropium  18 mcg Inhalation Daily  . DISCONTD: potassium chloride  40 mEq Oral Daily    Review of Systems - General ROS: negative for - chills,  fatigue, fever or hot flashes Hematological and Lymphatic ROS: negative for - bruising, fatigue, jaundice or pallor Endocrine ROS: negative for - hair pattern changes, hot flashes, mood swings or skin changes Respiratory ROS: positive for -  orthopnea or wheezing Cardiovascular ROS: negative for - dyspnea on exertion, orthopnea, palpitations or shortness of breath Gastrointestinal ROS: negative for - abdominal pain, appetite loss, blood in stools, diarrhea or hematemesis Musculoskeletal ROS: positive for - joint pain, joint stiffness, joint swelling or muscle pain Neurological ROS: positive for - numbness/tingling and weakness Dermatological ROS: negative for dry skin, pruritus and rash   Blood pressure 139/88, pulse 94, temperature 98.4 F (36.9 C), temperature source Oral, resp. rate 18, height 5' 4.17" (1.63 m), weight 57.1 kg (125 lb 14.1 oz), SpO2 89.00%.   Neurologic Examination:  Mental Status: Alert, oriented, thought content appropriate.  Speech fluent without evidence of aphasia. Able to follow 3 step commands without difficulty. Cranial Nerves: II-Visual fields grossly intact. III/IV/VI-Extraocular movements intact.  Pupils reactive bilaterally. V/VII-Smile symmetric VIII-grossly intact IX/X-normal gag XI-bilateral shoulder shrug XII-midline tongue extension Motor: 5/5 left arm and leg, able to move right leg 3/5 strength but limited by pain and guarding.  Right arm able to hold antigravity with drift.  Weak grip and weak triceps extension.  Sensory: Pinprick and light touch intact throughout, bilaterally Deep Tendon Reflexes: 1+ and symmetric throughout Plantars upgoing bilaterally Cerebellar: Normal finger-to-nose showed no overt dysmetria,    Lab Results  Component Value Date/Time   CHOL 131 12/08/2011  6:00 AM    Results for orders placed during the hospital encounter of 12/07/11 (from the past 48 hour(s))  CBC     Status: Abnormal   Collection Time   12/07/11  2:31  AM      Component Value Range Comment   WBC 8.1  4.0 - 10.5 (K/uL)    RBC 3.76 (*) 3.87 - 5.11 (MIL/uL)    Hemoglobin 11.4 (*) 12.0 - 15.0 (g/dL)    HCT 16.1  09.6 - 04.5 (%)    MCV 97.3  78.0 - 100.0 (fL)    MCH 30.3  26.0 - 34.0 (pg)    MCHC 31.1  30.0 - 36.0 (g/dL)    RDW 40.9 (*) 81.1 - 15.5 (%)    Platelets 196  150 - 400 (K/uL)   DIFFERENTIAL     Status: Abnormal   Collection Time   12/07/11  2:31 AM  Component Value Range Comment   Neutrophils Relative 78 (*) 43 - 77 (%)    Neutro Abs 6.2  1.7 - 7.7 (K/uL)    Lymphocytes Relative 10 (*) 12 - 46 (%)    Lymphs Abs 0.8  0.7 - 4.0 (K/uL)    Monocytes Relative 9  3 - 12 (%)    Monocytes Absolute 0.7  0.1 - 1.0 (K/uL)    Eosinophils Relative 3  0 - 5 (%)    Eosinophils Absolute 0.3  0.0 - 0.7 (K/uL)    Basophils Relative 0  0 - 1 (%)    Basophils Absolute 0.0  0.0 - 0.1 (K/uL)   PRO B NATRIURETIC PEPTIDE     Status: Abnormal   Collection Time   12/07/11  2:33 AM      Component Value Range Comment   Pro B Natriuretic peptide (BNP) 11771.0 (*) 0 - 125 (pg/mL)   POCT I-STAT TROPONIN I     Status: Abnormal   Collection Time   12/07/11  2:50 AM      Component Value Range Comment   Troponin i, poc 0.52 (*) 0.00 - 0.08 (ng/mL)    Comment NOTIFIED PHYSICIAN      Comment 3            POCT I-STAT, CHEM 8     Status: Abnormal   Collection Time   12/07/11  2:53 AM      Component Value Range Comment   Sodium 139  135 - 145 (mEq/L)    Potassium 2.6 (*) 3.5 - 5.1 (mEq/L)    Chloride 89 (*) 96 - 112 (mEq/L)    BUN 15  6 - 23 (mg/dL)    Creatinine, Ser 0.96  0.50 - 1.10 (mg/dL)    Glucose, Bld 045 (*) 70 - 99 (mg/dL)    Calcium, Ion 4.09  1.12 - 1.32 (mmol/L)    TCO2 38  0 - 100 (mmol/L)    Hemoglobin 12.9  12.0 - 15.0 (g/dL)    HCT 81.1  91.4 - 78.2 (%)    Comment NOTIFIED PHYSICIAN     HEMOGLOBIN A1C     Status: Normal   Collection Time   12/07/11 10:05 AM      Component Value Range Comment   Hemoglobin A1C 5.6  <5.7 (%)    Mean  Plasma Glucose 114  <117 (mg/dL)   MAGNESIUM     Status: Normal   Collection Time   12/07/11 10:05 AM      Component Value Range Comment   Magnesium 1.6  1.5 - 2.5 (mg/dL)   PROTIME-INR     Status: Abnormal   Collection Time   12/07/11 10:30 AM      Component Value Range Comment   Prothrombin Time 16.8 (*) 11.6 - 15.2 (seconds)    INR 1.34  0.00 - 1.49    CARDIAC PANEL(CRET KIN+CKTOT+MB+TROPI)     Status: Abnormal   Collection Time   12/07/11  1:58 PM      Component Value Range Comment   Total CK 53  7 - 177 (U/L)    CK, MB 5.0 (*) 0.3 - 4.0 (ng/mL)    Troponin I 0.67 (*) <0.30 (ng/mL)    Relative Index RELATIVE INDEX IS INVALID  0.0 - 2.5    BASIC METABOLIC PANEL     Status: Abnormal   Collection Time   12/07/11  1:59 PM      Component Value Range Comment   Sodium 141  135 - 145 (mEq/L)    Potassium 3.1 (*) 3.5 - 5.1 (mEq/L)    Chloride 95 (*) 96 - 112 (mEq/L)    CO2 >45 (*) 19 - 32 (mEq/L)    Glucose, Bld 144 (*) 70 - 99 (mg/dL)    BUN 14  6 - 23 (mg/dL)    Creatinine, Ser 2.44  0.50 - 1.10 (mg/dL) DELTA CHECK NOTED   Calcium 9.2  8.4 - 10.5 (mg/dL)    GFR calc non Af Amer >90  >90 (mL/min)    GFR calc Af Amer >90  >90 (mL/min)   HEPARIN LEVEL (UNFRACTIONATED)     Status: Abnormal   Collection Time   12/07/11  2:18 PM      Component Value Range Comment   Heparin Unfractionated <0.10 (*) 0.30 - 0.70 (IU/mL)   BLOOD GAS, ARTERIAL     Status: Abnormal   Collection Time   12/07/11  4:22 PM      Component Value Range Comment   FIO2 0.21      pH, Arterial 7.429 (*) 7.350 - 7.400     pCO2 arterial 63.3 (*) 35.0 - 45.0 (mmHg)    pO2, Arterial 38.0 (*) 80.0 - 100.0 (mmHg)    Bicarbonate 41.1 (*) 20.0 - 24.0 (mEq/L)    TCO2 43.0  0 - 100 (mmol/L)    Acid-Base Excess 15.8 (*) 0.0 - 2.0 (mmol/L)    O2 Saturation 66.8      Patient temperature 99.0      Collection site LEFT RADIAL      Drawn by [id number]      Sample type ARTERIAL DRAW      Allens test (pass/fail) PASS  PASS    GLUCOSE,  CAPILLARY     Status: Abnormal   Collection Time   12/07/11  9:18 PM      Component Value Range Comment   Glucose-Capillary 118 (*) 70 - 99 (mg/dL)    Comment 1 Documented in Chart      Comment 2 Notify RN     CARDIAC PANEL(CRET KIN+CKTOT+MB+TROPI)     Status: Abnormal   Collection Time   12/07/11  9:43 PM      Component Value Range Comment   Total CK 41  7 - 177 (U/L)    CK, MB 4.0  0.3 - 4.0 (ng/mL)    Troponin I 0.50 (*) <0.30 (ng/mL)    Relative Index RELATIVE INDEX IS INVALID  0.0 - 2.5    HEPARIN LEVEL (UNFRACTIONATED)     Status: Normal   Collection Time   12/07/11  9:43 PM      Component Value Range Comment   Heparin Unfractionated 0.49  0.30 - 0.70 (IU/mL)   CARDIAC PANEL(CRET KIN+CKTOT+MB+TROPI)     Status: Abnormal   Collection Time   12/08/11  6:00 AM      Component Value Range Comment   Total CK 34  7 - 177 (U/L)    CK, MB 3.2  0.3 - 4.0 (ng/mL)    Troponin I 0.31 (*) <0.30 (ng/mL)    Relative Index RELATIVE INDEX IS INVALID  0.0 - 2.5    HEPARIN LEVEL (UNFRACTIONATED)     Status: Normal   Collection Time   12/08/11  6:00 AM      Component Value Range Comment   Heparin Unfractionated 0.40  0.30 - 0.70 (IU/mL)   CBC     Status: Abnormal   Collection Time   12/08/11  6:00 AM  Component Value Range Comment   WBC 11.3 (*) 4.0 - 10.5 (K/uL)    RBC 3.66 (*) 3.87 - 5.11 (MIL/uL)    Hemoglobin 11.1 (*) 12.0 - 15.0 (g/dL)    HCT 86.5  78.4 - 69.6 (%)    MCV 98.6  78.0 - 100.0 (fL)    MCH 30.3  26.0 - 34.0 (pg)    MCHC 30.7  30.0 - 36.0 (g/dL)    RDW 29.5 (*) 28.4 - 15.5 (%)    Platelets 190  150 - 400 (K/uL)   BASIC METABOLIC PANEL     Status: Abnormal   Collection Time   12/08/11  6:00 AM      Component Value Range Comment   Sodium 136  135 - 145 (mEq/L)    Potassium 3.2 (*) 3.5 - 5.1 (mEq/L)    Chloride 92 (*) 96 - 112 (mEq/L)    CO2 36 (*) 19 - 32 (mEq/L)    Glucose, Bld 118 (*) 70 - 99 (mg/dL)    BUN 14  6 - 23 (mg/dL)    Creatinine, Ser 1.32  0.50 - 1.10 (mg/dL)     Calcium 9.1  8.4 - 10.5 (mg/dL)    GFR calc non Af Amer >90  >90 (mL/min)    GFR calc Af Amer >90  >90 (mL/min)   LIPID PANEL     Status: Normal   Collection Time   12/08/11  6:00 AM      Component Value Range Comment   Cholesterol 131  0 - 200 (mg/dL)    Triglycerides 97  <440 (mg/dL)    HDL 40  >10 (mg/dL)    Total CHOL/HDL Ratio 3.3      VLDL 19  0 - 40 (mg/dL)    LDL Cholesterol 72  0 - 99 (mg/dL)     Dg Chest 1 View  08/13/2534  *RADIOLOGY REPORT*  Clinical Data: Fall.  History of lung cancer.  CHEST - 1 VIEW  Comparison: Chest 11/11/2011.  The CT chest 11/26/2011.  Findings: Cardiac enlargement.  Normal pulmonary vascularity. Emphysematous changes in the lungs with scattered fibrosis.  Focal scarring in the right upper lung may represent postradiation change.  Spiculated appearing nodular opacities in both hilar regions may be related to scarring but metastasis is not excluded. No focal airspace consolidation.  No blunting of costophrenic angles.  No pneumothorax.  Degenerative changes and scoliosis of the spine.  Calcification of the aorta.  Right power port type central venous catheter with tip over the mid SVC region.  IMPRESSION: Cardiac enlargement.  Emphysema and fibrosis in the lungs. Scarring bilaterally.  Spiculated appearing nodular opacities in both hilar regions.  No active infiltration.  Original Report Authenticated By: Marlon Pel, M.D.   Dg Femur Right  12/07/2011  *RADIOLOGY REPORT*  Clinical Data: Pain after fall.  RIGHT FEMUR - 2 VIEW  Comparison: 11/13/2011  Findings: Postoperative changes in the right hip with intramedullary rod and screw and compression screw fixation.  No evidence of acute fracture or displacement.  No focal bone lesion or bone destruction.  Bone cortex and trabecular architecture appear intact.  Vascular calcifications.  Degenerative changes in the knee.  No significant change in positioning of hardware since previous study.  IMPRESSION:  Postoperative changes with internal fixation of the proximal right femur.  No acute bony abnormalities identified.  Original Report Authenticated By: Marlon Pel, M.D.   Ct Head Without Contrast  12/07/2011  *RADIOLOGY REPORT*  Clinical Data: Right  arm weakness  CT HEAD WITHOUT CONTRAST  Technique:  Contiguous axial images were obtained from the base of the skull through the vertex without contrast.  Comparison: 08/05/2010  Findings: Stable mild brain atrophy most pronounced in the frontal lobes.  No acute intracranial hemorrhage, definite mass lesion, infarction, midline shift, herniation, or hydrocephalus.  Gray- white matter differentiation maintained.  Cisterns patent.  No cerebellar abnormality.  Visualized mastoids and sinuses clear.  IMPRESSION: Stable exam.  No acute intracranial process by noncontrast CT  Original Report Authenticated By: Judie Petit. Ruel Favors, M.D.   Ct Angio Chest W/cm &/or Wo Cm  12/07/2011  **ADDENDUM** CREATED: 12/07/2011 05:40:23  The esophagus is mildly distended with an air-fluid level suggesting dysmotility.  **END ADDENDUM** SIGNED BY: Marlon Pel, M.D.   12/07/2011  *RADIOLOGY REPORT*  Clinical Data: Short of breath.  History of lung cancer and COPD. Fall.  CT ANGIOGRAPHY CHEST  Technique:  Multidetector CT imaging of the chest using the standard protocol during bolus administration of intravenous contrast. Multiplanar reconstructed images including MIPs were obtained and reviewed to evaluate the vascular anatomy.  Contrast: OMNIPAQUE IOHEXOL 350 MG/ML SOLN  Comparison: 11/26/2011  Findings: Technically adequate study with good opacification of the central and segmental pulmonary arteries.  There are focal intraluminal filling defects in the scattered bilateral segmental branches consistent with pulmonary emboli.  Cardiac enlargement.  Calcification of the aortic valve and coronary arteries.  Calcification and ectasia of the aorta. Scarring in the mid lungs  bilaterally with mass or adenopathy in the right hilum measuring about 15 mm diameter.  Pretracheal lymphadenopathy measures 12 mm short axis dimension.  Multiple pulmonary nodules are again demonstrated bilaterally consistent with metastatic disease.  Probably the largest is a spiculated nodule in the right upper lung measuring 8 mm diameter. Visualization of the lungs is limited due to respiratory motion artifact.  Diffuse emphysematous changes and fibrosis throughout the lungs.  Atelectasis in the lung bases.  Focal wedge-shaped consolidation in the right lung base posteriorly may represent a focal infarct.  Bronchiectasis and mucus plugging in the lung bases with peribronchial thickening consistent with chronic bronchitis. Bilateral adrenal gland nodules measuring 2.6 cm on the left and 3.3 cm on the right.  These demonstrate low density measurements suggesting adenomas.  Degenerative changes in the thoracic spine. No destructive bone lesions appreciated. Minimal right pleural effusion.  IMPRESSION: Positive study for multiple bilateral segmental pulmonary emboli. Possible small peripheral infarct in the right lung base posteriorly.  Again demonstrated are multiple diffuse bilateral pulmonary metastases, right hilar mass, diffuse emphysematous changes, and fibrosis. Chronic bronchitic changes.  Critical Value/emergent results were called by telephone at the time of interpretation on 12/07/2011  at 0440 hours  to  Dr. Hyacinth Meeker, who verbally acknowledged these results. Cardiac enlargement.  Bilateral adrenal gland nodules.  Original Report Authenticated By: Marlon Pel, M.D.   Mr Laqueta Jean Wo Contrast  12/07/2011  *RADIOLOGY REPORT*  Clinical Data: 71 year old female with metastatic cancer.  Right upper extremity weakness.  MRI HEAD WITHOUT AND WITH CONTRAST  Technique:  Multiplanar, multiecho pulse sequences of the brain and surrounding structures were obtained according to standard protocol without and with  intravenous contrast  Contrast: 10mL MULTIHANCE GADOBENATE DIMEGLUMINE 529 MG/ML IV SOLN  Comparison: Head CTs 12/07/2011 and earlier.  Findings: Multiple small foci of restricted diffusion scattered in the right hemisphere, including the frontal lobe (left upper extremity motor cortex regions series 4 image 18), parietal lobe, and also a fairly dense involvement  in the left occipital pole (series 4 image 7).  Additionally, there is a wedge-shaped infarct with restricted diffusion in the left cerebellum (image 4).  Also, there are two suspicious foci of possible diffusion restriction in the right hemisphere, pre motor cortex on image 20 and possible focal right occipital lobe cortex involvement on image 80.  None of these areas show evidence of hemorrhage or mass effect. Major intracranial vascular flow voids are preserved.  No ventriculomegaly.  No midline shift.  No abnormal enhancement or mass identified in the brain.  Chronic lacunar infarcts occasionally noted in the deep gray matter nuclei and cerebellum.  Overall normal cerebral volume. Negative pituitary and cervicomedullary junction.  Visualized bone marrow signal is within normal limits. Degenerative changes and cervical spine. Visualized orbit soft tissues are within normal limits.  Visualized paranasal sinuses and mastoids are clear.  Negative scalp soft tissues.  IMPRESSION: 1.  Multiple scattered small acute infarcts in the left hemisphere involving both the MCA and PCA territories.  Left upper extremity motor cortex region is affected. 2.  Small wedge-shaped acute infarct also in the left cerebellum. 3.  Suggestion of several punctate infarcts also in the right hemisphere.  This constellation raise the possibility of recent embolic event. 4.  No mass effect or hemorrhage.  Mild for age underlying chronic small vessel ischemia. 5.  No metastatic disease identified.  Original Report Authenticated By: Harley Hallmark, M.D.   Carotid Doppler preliminary  --No ICA stenosis  2 D echo pending  Assessment/Plan:  71 YO female with known history of metastatic lung cancer, history of breast cancer with prior chemotherapy and current radiation therapy.  Patient presents to hospital with acute PE and acute bilateral CVA likely embolic in source given history of cancer which puts her at risk of hypercoagulable state.  She has currently been placed on ASA and heparin IV for PE.    Recommend:  1) No objection to use of Lovenox for anticoagulation therapy  2) Continue with CVA work up including 2 D echo and carotid doppler, HA1c and FLP, PT/OT.    Dr. Roseanne Reno has evaluated patient and agrees with the above mentioned.     Felicie Morn PA-C [facility] [phone]  12/08/2011, 11:17 AM

## 2011-12-08 NOTE — Evaluation (Signed)
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 Pt very self limiting and fearful of mobility. Skilled OT indicated to maximiz I w/BADLs to supervision level in prep for d/c to next venue of care or home with HHOT depending on progress.    OT Assessment  Patient needs continued OT Services    Follow Up Recommendations  Skilled nursing facility;Home health OT;Supervision/Assistance - 24 hour    Barriers to Discharge Decreased caregiver support    Equipment Recommendations  None recommended by PT (Simultaneous filing. User may not have seen previous data.)    Recommendations for Other Services    Frequency  Min 2X/week    Precautions / Restrictions Precautions Precautions: Fall   Pertinent Vitals/Pain     ADL  Grooming: Simulated;Set up Where Assessed - Grooming: Unsupported sitting Upper Body Bathing: Simulated;Minimal assistance Where Assessed - Upper Body Bathing: Unsupported sitting Lower Body Bathing: Simulated;Moderate assistance Where Assessed - Lower Body Bathing: Supported sit to stand Upper Body Dressing: Simulated;Minimal assistance Where Assessed - Upper Body Dressing: Unsupported sitting Lower Body Dressing: Simulated;Moderate assistance Where Assessed - Lower Body Dressing: Sopported sit to stand Toilet Transfer: Simulated;Minimal assistance Toilet Transfer  Method: Sit to Barista:  (to recliner) Toileting - Clothing Manipulation and Hygiene: Simulated;Minimal assistance Where Assessed - Engineer, mining and Hygiene: Standing Equipment Used: Rolling walker ADL Comments: Pt required a MAX amt of cueing and encouragement to participate. Repeatedly would say, "I cant't, I can"t as she was performing the task. Pt very self limiting with poor pain tolerance. Pt repeatedly would shout out and describe her pain as a 10 but was still able to carry on a conversation    OT Diagnosis: Generalized weakness  OT Problem List: Decreased strength;Decreased safety awareness;Decreased activity tolerance;Impaired balance (sitting and/or standing);Decreased knowledge of use of DME or AE OT Treatment Interventions: Self-care/ADL training;Therapeutic activities;DME and/or AE instruction;Patient/family education;Balance training   OT Goals Acute Rehab OT Goals OT Goal Formulation: With patient Time For Goal Achievement: 12/22/11 Potential to Achieve Goals: Good ADL Goals Pt Will Perform Grooming: Standing at sink;with supervision (X 3 tasks to improve standing activity tolerance.) ADL Goal: Grooming - Progress: Goal set today Pt Will Perform Upper Body Bathing: with set-up;Sitting, edge of bed;Sitting, chair;Unsupported ADL Goal: Upper Body Bathing - Progress: Goal set today Pt Will Perform Lower Body Bathing: with supervision;Sit to stand from chair;Sit to stand from bed ADL Goal: Lower Body Bathing - Progress: Goal set today Pt Will Perform Upper Body Dressing: with supervision;Sit to stand from chair;Sit to stand from bed ADL Goal: Upper Body Dressing - Progress: Goal set today Pt Will Perform Lower Body Dressing: Sit to stand from chair;Sit to stand from bed;with supervision ADL Goal: Lower Body Dressing - Progress: Goal set today Pt Will Transfer to Toilet: with supervision;Ambulation;3-in-1;Regular height toilet ADL  Goal: Toilet Transfer - Progress: Goal set today Pt Will Perform Toileting - Hygiene: with supervision;Sit to stand from 3-in-1/toilet ADL Goal:  Toileting - Hygiene - Progress: Goal set today Pt Will Perform Tub/Shower Transfer: Shower transfer;with supervision;Ambulation ADL Goal: Tub/Shower Transfer - Progress: Goal set today Arm Goals Pt Will Complete Theraband Exer: with supervision, verbal cues required/provided;2 sets;10 reps;Level 1 Theraband Arm Goal: Theraband Exercises - Progress: Goal set today  Visit Information  Last OT Received On: 12/08/11 Assistance Needed: +1 PT/OT Co-Evaluation/Treatment: Yes    Subjective Data  Subjective: I cant get up Ive had a stroke. Patient Stated Goal: I want to walk again.   Prior Functioning  Home Living Lives With: Family (sister, niece and nephew.) Available Help at Discharge: Other (Comment) (pt states all family members are disabled) Type of Home: Mobile home Home Access: Ramped entrance Home Layout: One level Bathroom Shower/Tub: Health visitor: Standard (vanity beside) Home Adaptive Equipment: Grab bars in shower;Walker - rolling;Straight cane Prior Function Level of Independence: Needs assistance Needs Assistance: Light Housekeeping;Meal Prep Meal Prep: Total Light Housekeeping: Total Driving: Yes Vocation: Retired Comments: Pt states that she is only able to take care of herself "halfway" and needs assistance but is unable to get it at home. Communication Communication: No difficulties    Cognition  Overall Cognitive Status: Appears within functional limits for tasks assessed/performed Arousal/Alertness: Awake/alert Orientation Level: Appears intact for tasks assessed Behavior During Session: [facility] for tasks performed Cognition - Other Comments: Pt oriented but consistently confusing right and left with difficulty following step by step commands related to right and left directions. Pt also just generally  fearful of falling or pain and limiting herself out of fear.     Extremity/Trunk Assessment Right Upper Extremity Assessment RUE ROM/Strength/Tone: Deficits RUE ROM/Strength/Tone Deficits: ROM WFL. Strength 3+/5 grossly RUE Sensation: WFL - Light Touch RUE Coordination: WFL - gross/fine motor Left Upper Extremity Assessment LUE ROM/Strength/Tone: WFL for tasks assessed LUE Sensation: WFL - Light Touch LUE Coordination: WFL - gross/fine motor Right Lower Extremity Assessment RLE ROM/Strength/Tone: Deficits RLE ROM/Strength/Tone Deficits: pt reports decreased ability with hip flexion grossly 2+/5 Left Lower Extremity Assessment LLE ROM/Strength/Tone: WFL for tasks assessed   Mobility Bed Mobility Bed Mobility: Supine to Sit Supine to Sit: 3: Mod assist;With rails;HOB elevated Supine to Sit: Patient Percentage: 60% Sitting - Scoot to Edge of Bed: 5: Supervision Details for Bed Mobility Assistance: Max multimodal cues for sequencing, technique and hand placement. Transfers Transfers: Sit to Stand;Stand to Sit Sit to Stand: 4: Min assist;With upper extremity assist;From bed Stand to Sit: 4: Min assist;With upper extremity assist;To chair/3-in-1;With armrests Details for Transfer Assistance: Max multimodal cues for hand placement, step sequence   Exercise General Exercises - Lower Extremity Heel Slides: AAROM;Right;5 reps;Supine  Balance Balance Balance Assessed: Yes Static Sitting Balance Static Sitting - Balance Support: Bilateral upper extremity supported;Feet supported Static Sitting - Level of Assistance: 5: Stand by assistance Static Sitting - Comment/# of Minutes: 3 Dynamic Sitting Balance Dynamic Sitting - Balance Support: No upper extremity supported;Feet supported Dynamic Sitting - Level of Assistance: 5: Stand by assistance Static Standing Balance Static Standing - Balance Support: Bilateral upper extremity supported;During functional activity Static Standing - Level of  Assistance: 4: Min assist  End of Session OT - End of Session Equipment Utilized During Treatment: Gait belt Activity Tolerance: Other (comment) (limited by anxiety and fear.) Patient left: in chair;with call bell/phone within reach Nurse Communication: Mobility status   Dustie Brittle A OTR/L [phone] 12/08/2011, 2:26 PM

## 2011-12-08 NOTE — Telephone Encounter (Signed)
Received call from Dr.Davis that patient had acute stroke and bilateral pulmonary emboli.No radiation for now.Will inform Dr.Kinard.Lindasay rt on linac #3 informed.

## 2011-12-08 NOTE — Progress Notes (Signed)
ANTICOAGULATION CONSULT NOTE - Follow Up Consult  Pharmacy Consult for Heparin Indication: Bilateral PEs, acute CVA  Allergies  Allergen Reactions  . Other Hives    Cheap jewelry.    Patient Measurements: Height: 5' 4.17" (163 cm) Weight: 125 lb 14.1 oz (57.1 kg) IBW/kg (Calculated) : 55.1  Heparin Dosing Weight: 57.1kg  Vital Signs: Temp: 98.4 F (36.9 C) (06/03 0800) Temp src: Oral (06/03 0800) BP: 139/88 mmHg (06/03 0800) Pulse Rate: 94  (06/03 0800)  Labs:  Basename 12/08/11 0600 12/07/11 2143 12/07/11 1418 12/07/11 1359 12/07/11 1358 12/07/11 1030 12/07/11 0253 12/07/11 0231  HGB 11.1* -- -- -- -- -- 12.9 --  HCT 36.1 -- -- -- -- -- 38.0 36.6  PLT 190 -- -- -- -- -- -- 196  APTT -- -- -- -- -- -- -- --  LABPROT -- -- -- -- -- 16.8* -- --  INR -- -- -- -- -- 1.34 -- --  HEPARINUNFRC 0.40 0.49 <0.10* -- -- -- -- --  CREATININE 0.51 -- -- 0.55 -- -- 0.90 --  CKTOTAL 34 41 -- -- 53 -- -- --  CKMB 3.2 4.0 -- -- 5.0* -- -- --  TROPONINI 0.31* 0.50* -- -- 0.67* -- -- --    Estimated Creatinine Clearance: 56.1 ml/min (by C-G formula based on Cr of 0.51).   Medications:  Heparin 1100 units/hr  Assessment: 71yof on heparin for bilateral PEs and acute CVA. Heparin level (0.4) is therapeutic x 2. Heparin goal range will be reduced due to MRI confirmation of acute CVA (0.3-0.5). Spoke with Dr. Wandra Mannan, will not transition to Lovenox at this time due to risk of hemorrhagic conversion. Neuro has been consulted. MD will defer anticoagulation selection and timing to Neuro (patient also has metastatic cancer).  - H/H trending down, Plts wnl - No significant bleeding reported  Goal of Therapy:  Heparin level 0.3-0.5 units/ml Monitor platelets by anticoagulation protocol: Yes   Plan:  1. Continue heparin drip 1100 units/hr (11 ml/hr) 2. Follow-up AM heparin level and CBC 3. Follow-up Neuro recommendations/plans  Cleon Dew [phone] 12/08/2011,9:18  AM

## 2011-12-08 NOTE — Care Management Note (Unsigned)
    Page 1 of 2   12/12/2011     11:04:28 AM   CARE MANAGEMENT NOTE 12/12/2011  Patient:  [name], [name]   Account Number:  [id number]  Date Initiated:  12/08/2011  Documentation initiated by:  SIMMONS,Calise Dunckel  Subjective/Objective Assessment:   ADMITTED WTH BILATERAL PE'S/ CVA; PT HAS LUNG CA WITH METS; LIVES AT HOME WITH SISTER, NEICE AND NEPHEW; NEEDS ASSISTANCE WITH ADL'S.     Action/Plan:   DISCHARGE PLANNING DISCUSSED WITH PT AT BEDSIDE; SHE STATED THAT SHE NEEDED MORE HELP AT HOME BUT THERE'S NO ONE THAT IS ABLE TO CARE FOR HER B/C THEIR OWN HEALTH PROBLEMS. AGREEABLE TO SNF IF NECESSARY.   Anticipated DC Date:  12/12/2011   Anticipated DC Plan:  HOME W HOME HEALTH SERVICES  In-house referral  Clinical Social Worker      DC Associate Professor  CM consult      PAC Choice  DURABLE MEDICAL EQUIPMENT  HOME HEALTH   Choice offered to / List presented to:  C-1 Patient   DME arranged  3-N-1      DME agency  [facility]     HH arranged  HH-1 RN  HH-2 PT      [facility] agency  [facility]   Status of service:  In process, will continue to follow Medicare Important Message given?   (If response is "NO", the following Medicare IM given date fields will be blank) Date Medicare IM given:   Date Additional Medicare IM given:    Discharge Disposition:    Per UR Regulation:  Reviewed for med. necessity/level of care/duration of stay  If discussed at Long Length of Stay Meetings, dates discussed:    Comments:  12/12/11  1056  Jomarie Gellis SIMMONS RN, BSN [phone] DISCUSSED DISCHARGE PLANNING WHILE AMBULATING WITH PHYSICAL THERAPIST; PREFERS [facility] FOR HHRN/ PT- REFERRAL PLACED TO [place] WITH [facility] FOR HH; REFERRAL PLACED TO [place] WITH [facility] FOR BSC; PT STATED THAT SHE "NEEDS" A W/C BUT PHYSICAL THERAPIST STATED THAT SHE DOES NOT NEED ONE; SOC DATE FOR HH: WITHIN 24-48HRS.   12/08/11  1115  Shimshon Narula SIMMONS RN, BSN [phone] WILL AWAIT PT/OT CONSULTS FOR  RECOMMENDATIONS TO ASSIST WITH DISCHARGE PLANNING NEEDS;  NCM WILL FOLLOW.

## 2011-12-08 NOTE — Evaluation (Signed)
Physical Therapy Evaluation Patient Details Name: [name] MRN: [id number] DOB: 07/24/1940 Today's Date: 12/08/2011 Time: 4098-1191 PT Time Calculation (min): 34 min  PT Assessment / Plan / Recommendation Clinical Impression  Pt admitted with persistent right hip pain after mets to Right hip sx a few weeks ago also with bil PE. Pt very fearful of pain and falling today and needed reassurance and encouragement throughout session. Pt should progress well depending on pt fear of mobility. Encouraged pt to continue mobilizing with nursing staff and progressing with therapy. Pt will benefit from acute therapy to maximize transfers, balance, safety, and gait before discharge to decrease burden of care.     PT Assessment  Patient needs continued PT services    Follow Up Recommendations  Home health PT;Supervision for mobility/OOB;Skilled nursing facility (pending family assist and pt progression)    Barriers to Discharge Decreased caregiver support pt reports niece can assist but others are disabled and pt unsure of how much assist family can truly provide    lEquipment Recommendations  None recommended by PT (Simultaneous filing. User may not have seen previous data.)    Recommendations for Other Services     Frequency Min 4X/week    Precautions / Restrictions Precautions Precautions: Fall   Pertinent Vitals/Pain Pt reports pain in Right hip with transfers and gait but no pain at rest grossly 4/10 with movement, RN aware Pt on 2L throughout with drop to 87% on RA      Mobility  Bed Mobility Bed Mobility: Supine to Sit Supine to Sit: 3: Mod assist;With rails;HOB elevated Supine to Sit: Patient Percentage: 60% Sitting - Scoot to Edge of Bed: 5: Supervision Details for Bed Mobility Assistance: Max multimodal cues for sequencing, technique and hand placement. Transfers Transfers: Sit to Stand;Stand to Sit Sit to Stand: 4: Min assist;With upper extremity assist;From bed Stand to  Sit: 4: Min assist;With upper extremity assist;To chair/3-in-1;With armrests Details for Transfer Assistance: Max multimodal cues for hand placement, step sequence Ambulation/Gait Ambulation/Gait Assistance: 4: Min assist Ambulation Distance (Feet): 15 Feet Assistive device: Rolling walker Ambulation/Gait Assistance Details: sequential cueing to reduce RLE pain and pt needed simple one step commands to complete Gait Pattern: Step-to pattern;Decreased stance time - right Gait velocity: decreased Stairs: No    Exercises General Exercises - Lower Extremity Heel Slides: AAROM;Right;5 reps;Supine   PT Diagnosis: Difficulty walking;Abnormality of gait;Acute pain  PT Problem List: Decreased strength;Decreased activity tolerance;Decreased mobility;Pain;Decreased knowledge of use of DME PT Treatment Interventions: Gait training;Stair training;DME instruction;Functional mobility training;Therapeutic activities;Therapeutic exercise;Patient/family education   PT Goals Acute Rehab PT Goals PT Goal Formulation: With patient Time For Goal Achievement: 12/22/11 Potential to Achieve Goals: Good Pt will go Supine/Side to Sit: with supervision PT Goal: Supine/Side to Sit - Progress: Goal set today Pt will go Sit to Supine/Side: with supervision PT Goal: Sit to Supine/Side - Progress: Goal set today Pt will go Sit to Stand: with supervision PT Goal: Sit to Stand - Progress: Goal set today Pt will go Stand to Sit: with supervision PT Goal: Stand to Sit - Progress: Goal set today Pt will Ambulate: >150 feet;with least restrictive assistive device;with supervision PT Goal: Ambulate - Progress: Goal set today  Visit Information  Last PT Received On: 12/08/11 Assistance Needed: +1 PT/OT Co-Evaluation/Treatment: Yes    Subjective Data  Subjective: I just can't Patient Stated Goal: be able to walk and do like I was   Prior Functioning  Home Living Lives With: Family (sister, niece and  nephew.) Available Help at Discharge: Other (Comment) (pt states all family members are disabled) Type of Home: Mobile home Home Access: Ramped entrance Home Layout: One level Bathroom Shower/Tub: Health visitor: Standard (vanity beside) Home Adaptive Equipment: Grab bars in shower;Walker - rolling;Straight cane Prior Function Level of Independence: Needs assistance Needs Assistance: Light Housekeeping;Meal Prep Meal Prep: Total Light Housekeeping: Total Driving: Yes Vocation: Retired Comments: Pt states that she is only able to take care of herself "halfway" and needs assistance but is unable to get it at home. Communication Communication: No difficulties    Cognition  Overall Cognitive Status: Appears within functional limits for tasks assessed/performed Arousal/Alertness: Awake/alert Orientation Level: Appears intact for tasks assessed Behavior During Session: [facility] for tasks performed Cognition - Other Comments: Pt oriented but consistently confusing right and left with difficulty following step by step commands related to right and left directions. Pt also just generally fearful of falling or pain and limiting herself out of fear.     Extremity/Trunk Assessment Right Upper Extremity Assessment RUE ROM/Strength/Tone: Deficits RUE ROM/Strength/Tone Deficits: ROM WFL. Strength 3+/5 grossly RUE Sensation: WFL - Light Touch RUE Coordination: WFL - gross/fine motor Left Upper Extremity Assessment LUE ROM/Strength/Tone: WFL for tasks assessed LUE Sensation: WFL - Light Touch LUE Coordination: WFL - gross/fine motor Right Lower Extremity Assessment RLE ROM/Strength/Tone: Deficits RLE ROM/Strength/Tone Deficits: pt reports decreased ability with hip flexion grossly 2+/5 Left Lower Extremity Assessment LLE ROM/Strength/Tone: WFL for tasks assessed   Balance Balance Balance Assessed: Yes Static Sitting Balance Static Sitting - Balance Support: Bilateral upper  extremity supported;Feet supported Static Sitting - Level of Assistance: 5: Stand by assistance Static Sitting - Comment/# of Minutes: 3 Dynamic Sitting Balance Dynamic Sitting - Balance Support: No upper extremity supported;Feet supported Dynamic Sitting - Level of Assistance: 5: Stand by assistance Static Standing Balance Static Standing - Balance Support: Bilateral upper extremity supported;During functional activity Static Standing - Level of Assistance: 4: Min assist  End of Session PT - End of Session Equipment Utilized During Treatment: Gait belt Activity Tolerance: Patient tolerated treatment well Patient left: in chair;with call bell/phone within reach Nurse Communication: Mobility status   Delorse Lek 12/08/2011, 2:23 PM  Delaney Meigs, PT [phone]

## 2011-12-09 ENCOUNTER — Ambulatory Visit: Payer: No Typology Code available for payment source

## 2011-12-09 DIAGNOSIS — E876 Hypokalemia: Secondary | ICD-10-CM

## 2011-12-09 DIAGNOSIS — G459 Transient cerebral ischemic attack, unspecified: Secondary | ICD-10-CM

## 2011-12-09 DIAGNOSIS — I2699 Other pulmonary embolism without acute cor pulmonale: Secondary | ICD-10-CM

## 2011-12-09 DIAGNOSIS — Z85118 Personal history of other malignant neoplasm of bronchus and lung: Secondary | ICD-10-CM

## 2011-12-09 LAB — GLUCOSE, CAPILLARY: Glucose-Capillary: 119 mg/dL — ABNORMAL HIGH (ref 70–99)

## 2011-12-09 LAB — CBC
MCH: 31.1 pg (ref 26.0–34.0)
MCV: 100.5 fL — ABNORMAL HIGH (ref 78.0–100.0)
Platelets: 198 10*3/uL (ref 150–400)
RBC: 3.79 MIL/uL — ABNORMAL LOW (ref 3.87–5.11)

## 2011-12-09 LAB — BASIC METABOLIC PANEL
CO2: 35 mEq/L — ABNORMAL HIGH (ref 19–32)
Calcium: 9.5 mg/dL (ref 8.4–10.5)
Creatinine, Ser: 0.43 mg/dL — ABNORMAL LOW (ref 0.50–1.10)

## 2011-12-09 MED ORDER — ENOXAPARIN SODIUM 60 MG/0.6ML ~~LOC~~ SOLN
55.0000 mg | SUBCUTANEOUS | Status: AC
  Administered 2011-12-09: 55 mg via SUBCUTANEOUS
  Filled 2011-12-09: qty 0.6

## 2011-12-09 MED ORDER — ENOXAPARIN SODIUM 60 MG/0.6ML ~~LOC~~ SOLN
55.0000 mg | Freq: Two times a day (BID) | SUBCUTANEOUS | Status: DC
  Administered 2011-12-09 – 2011-12-13 (×8): 55 mg via SUBCUTANEOUS
  Filled 2011-12-09 (×10): qty 0.6

## 2011-12-09 NOTE — Clinical Social Work Placement (Signed)
     Clinical Social Work Department CLINICAL SOCIAL WORK PLACEMENT NOTE 12/09/2011  Patient:  [name], [name]  Account Number:  [id number] Admit date:  12/07/2011  Clinical Social Worker:  Robin Searing  Date/time:  12/09/2011 04:00 PM  Clinical Social Work is seeking post-discharge placement for this patient at the following level of care:   SKILLED NURSING   (*CSW will update this form in Epic as items are completed)   12/09/2011  Patient/family provided with [facility] Department of Clinical Social Works list of facilities offering this level of care within the geographic area requested by the patient (or if unable, by the patients family).  12/09/2011  Patient/family informed of their freedom to choose among providers that offer the needed level of care, that participate in Medicare, Medicaid or managed care program needed by the patient, have an available bed and are willing to accept the patient.  12/09/2011  Patient/family informed of [facility] ownership interest in [facility], as well as of the fact that they are under no obligation to receive care at this facility.  PASARR submitted to EDS on 12/09/2011 PASARR number received from EDS on   FL2 transmitted to all facilities in geographic area requested by pt/family on   FL2 transmitted to all facilities within larger geographic area on   Patient informed that his/her managed care company has contracts with or will negotiate with  certain facilities, including the following:     Patient/family informed of bed offers received:   Patient chooses bed at  Physician recommends and patient chooses bed at    Patient to be transferred to  on   Patient to be transferred to facility by   The following physician request were entered in Epic:   Additional Comments:

## 2011-12-09 NOTE — Progress Notes (Signed)
[facility] PROGRESS NOTE  [name] QMV:[id number] DOB: 11/16/1940 DOA: 12/07/2011 PCP: [name], MD, MD  Assessment/Plan: CVA (cerebral infarction)  Patient with acute CVA on MRI. She was not on aspirin prior to the event. Start her on aspirin 325 mg daily. Continue her on Lovenox for the PE.    Malignant neoplasm of bronchus and lung, unspecified site/Secondary malignant neoplasm of bone and bone marrow Discussed with Dr. her Shirline Frees. No intervention at this time. Also spoke with Radiology/Oncology.   COPD (chronic obstructive pulmonary disease) Patient has COPD but was not on oxygen at home. Continue oxygen and nebulizer when necessary. Patient is a CO2 retainer so will keep her nasal cannula at 2 L.    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 Physical therapy evaluated patient and recommend short-term skilled nursing facility.  Pulmonary embolism As mentioned above will continue Lovenox.     Hypokalemia Resolved  Code Status: DNR Family Communication: None Disposition Plan: Short-term skilled nursing when bed available Molli Posey, MD  [facility] Pager [phone]  If 8PM-8AM, please contact night-coverage [email or web address] Password [email or web address] 12/09/2011, 10:35 AM   LOS: 1 day   Brief narrative: None  Consultants:  Neurology: Dr. Roseanne Reno  Oncology: Dr. Jerolyn Center notified  Procedures:  See Below  Antibiotics:  None ?  HPI/Subjective: Patient feels fine today. She feels that  she continues to improve.    Objective: Filed Vitals:   12/08/11 0426 12/08/11 0432 12/08/11 0800 12/08/11 0823  BP: 123/77  139/88   Pulse: 83  94   Temp: 97.7 F (36.5 C)  98.4 F (36.9 C)   TempSrc: Oral  Oral   Resp: 18  18   Height:      Weight:      SpO2: 91% 91% 91% 89%    Intake/Output Summary (Last 24 hours) at 12/08/11 1035 Last data filed at  12/08/11 0432  Gross per 24 hour  Intake    480 ml  Output   1050 ml  Net   -570 ml    Exam:   General: Patient is sitting up in chair . She did does not seem to be in any acute distress.   Cardiovascular: Regular rate rhythm  Respiratory: Clear to auscultation bilaterally  Abdomen: Soft positive bowel   Extremities: 1+ edema  Data Reviewed: Basic Metabolic Panel:  Lab 12/08/11 4010 12/07/11 1359 12/07/11 1005 12/07/11 0253  NA 136 141 -- 139  K 3.2* 3.1* -- --  CL 92* 95* -- 89*  CO2 36* >45* -- --  GLUCOSE 118* 144* -- 119*  BUN 14 14 -- 15  CREATININE 0.51 0.55 -- 0.90  CALCIUM 9.1 9.2 -- --  MG -- -- 1.6 --  PHOS -- -- -- --   CBC:  Lab 12/08/11 0600 12/07/11 0253 12/07/11 0231  WBC 11.3* -- 8.1  NEUTROABS -- -- 6.2  HGB 11.1* 12.9 11.4*  HCT 36.1 38.0 36.6  MCV 98.6 -- 97.3  PLT 190 -- 196   Cardiac Enzymes:  Lab 12/08/11 0600 12/07/11 2143 12/07/11 1358  CKTOTAL 34 41 53  CKMB 3.2 4.0 5.0*  CKMBINDEX -- -- --  TROPONINI 0.31* 0.50* 0.67*   CBG:  Lab 12/07/11 2118  GLUCAP 118*    Studies: Ct Abdomen Pelvis Wo Contrast  11/26/2011  *RADIOLOGY REPORT*  Clinical Data:  History of lung cancer.  Abdominal pain and nausea.  CT CHEST, ABDOMEN AND PELVIS  WITHOUT CONTRAST  Technique:  Multidetector CT imaging of the chest, abdomen and pelvis was performed following the standard protocol without IV contrast.  Comparison:  Prior CT scan 08/21/2011.  CT CHEST  Findings:  The chest wall is unremarkable and stable.  The right- sided Port-A-Cath is stable.  No breast masses, supraclavicular or axillary adenopathy.  The thyroid gland appears normal.  Dense vascular calcifications.  The bony thorax is intact.  Stable osteoporosis.  The heart is mildly enlarged but stable.  No pericardial effusion. Stable dense aortic and coronary artery calcifications.  No mediastinal or hilar lymphadenopathy.  The esophagus is grossly normal.  Stable emphysematous changes in the  lungs.  Stable radiation changes involving the paramediastinal lungs. There are new and enlarging bilateral pulmonary nodules consistent with metastatic pulmonary disease.  No pleural effusions or pulmonary edema.  IMPRESSION:  1. New and enlarging pulmonary nodules consistent with pulmonary metastatic disease. 2.  Stable radiation changes. 3.  Stable emphysematous changes.  CT ABDOMEN AND PELVIS  Findings:  The liver is grossly normal without contrast.  No obvious metastatic lesions.  No biliary dilatation.  The gallbladder is slightly contracted.  The pancreas is normal.  The spleen is normal in size.  No focal lesions.  Stable bilateral adrenal gland masses consistent with benign adenomas.  No renal lesions.  The stomach, duodenum, small bowel and colon are grossly normal. No mesenteric or retroperitoneal masses or adenopathy.  Stable small scattered lymph nodes.  The aorta demonstrates advanced atherosclerotic calcifications as to the branch vessels.  A remote chronic distal aortic dissection is noted.  There is a stable left adnexal cysts with benign CT imaging features.  No pelvic mass, adenopathy or free pelvic fluid collections.  No inguinal mass or hernia.  Examination of the bony structures demonstrates an enlarging lytic lesion involving the right iliac bone with cortical breakthrough. There is also a destructive lytic lesion involving the right femoral neck with associated soft tissue mass.  Subsequent placement of a femoral rod.  The lumbar vertebral bodies are intact.  IMPRESSION:  1.  Stable bilateral adrenal gland adenomas. 2.  Stable left adnexal cyst. 3.  Progressive lytic bone lesion involving the right ilium with associated destructive bony change. 4.  Interval placement of a femoral rod across the pathologic lesion near the lesser trochanter. 5.  No CT findings for metastatic hepatic disease or abdominal/pelvic adenopathy.  Original Report Authenticated By: P. Loralie Champagne, M.D.   Dg Chest 1  View  12/07/2011  *RADIOLOGY REPORT*  Clinical Data: Fall.  History of lung cancer.  CHEST - 1 VIEW  Comparison: Chest 11/11/2011.  The CT chest 11/26/2011.  Findings: Cardiac enlargement.  Normal pulmonary vascularity. Emphysematous changes in the lungs with scattered fibrosis.  Focal scarring in the right upper lung may represent postradiation change.  Spiculated appearing nodular opacities in both hilar regions may be related to scarring but metastasis is not excluded. No focal airspace consolidation.  No blunting of costophrenic angles.  No pneumothorax.  Degenerative changes and scoliosis of the spine.  Calcification of the aorta.  Right power port type central venous catheter with tip over the mid SVC region.  IMPRESSION: Cardiac enlargement.  Emphysema and fibrosis in the lungs. Scarring bilaterally.  Spiculated appearing nodular opacities in both hilar regions.  No active infiltration.  Original Report Authenticated By: Marlon Pel, M.D.   Dg Chest 2 View  11/11/2011  *RADIOLOGY REPORT*  Clinical Data: Preop radiograph.  CHEST - 2 VIEW  Comparison: 08/21/2011  Findings: There is a right chest wall porta-catheter with tip in the cavoatrial junction.  Heart size is normal.  Chronic, bilateral upper lobe opacities perihilar opacities are identified and appear similar to previous exam.  No superimposed acute airspace consolidation identified.  No pleural effusion or edema.  There is a marked scoliosis deformity involving the thoracic and lumbar spine.  IMPRESSION:  1.  Stable upper lobe perihilar scarring 2.  No acute cardiopulmonary abnormalities  Original Report Authenticated By: Rosealee Albee, M.D.   Dg Hip Operative Right  11/13/2011  *RADIOLOGY REPORT*  Clinical Data: Hip lesion  DG OPERATIVE RIGHT HIP  Comparison: 11/01/2011  Findings: Three spot images demonstrate a dynamic compression screw and intramedullary rod transfixed in the proximal right femur.  One distal interlocking screw.   Anatomic alignment.  No breakage or loosening of the hardware.  IMPRESSION: Internal fixation proximal right femur.  Original Report Authenticated By: Donavan Burnet, M.D.   Dg Femur Right  12/07/2011  *RADIOLOGY REPORT*  Clinical Data: Pain after fall.  RIGHT FEMUR - 2 VIEW  Comparison: 11/13/2011  Findings: Postoperative changes in the right hip with intramedullary rod and screw and compression screw fixation.  No evidence of acute fracture or displacement.  No focal bone lesion or bone destruction.  Bone cortex and trabecular architecture appear intact.  Vascular calcifications.  Degenerative changes in the knee.  No significant change in positioning of hardware since previous study.  IMPRESSION: Postoperative changes with internal fixation of the proximal right femur.  No acute bony abnormalities identified.  Original Report Authenticated By: Marlon Pel, M.D.   Ct Head Without Contrast  12/07/2011  *RADIOLOGY REPORT*  Clinical Data: Right arm weakness  CT HEAD WITHOUT CONTRAST  Technique:  Contiguous axial images were obtained from the base of the skull through the vertex without contrast.  Comparison: 08/05/2010  Findings: Stable mild brain atrophy most pronounced in the frontal lobes.  No acute intracranial hemorrhage, definite mass lesion, infarction, midline shift, herniation, or hydrocephalus.  Gray- white matter differentiation maintained.  Cisterns patent.  No cerebellar abnormality.  Visualized mastoids and sinuses clear.  IMPRESSION: Stable exam.  No acute intracranial process by noncontrast CT  Original Report Authenticated By: Judie Petit. Ruel Favors, M.D.   Ct Chest Wo Contrast  11/26/2011  *RADIOLOGY REPORT*  Clinical Data:  History of lung cancer.  Abdominal pain and nausea.  CT CHEST, ABDOMEN AND PELVIS WITHOUT CONTRAST  Technique:  Multidetector CT imaging of the chest, abdomen and pelvis was performed following the standard protocol without IV contrast.  Comparison:  Prior CT scan  08/21/2011.  CT CHEST  Findings:  The chest wall is unremarkable and stable.  The right- sided Port-A-Cath is stable.  No breast masses, supraclavicular or axillary adenopathy.  The thyroid gland appears normal.  Dense vascular calcifications.  The bony thorax is intact.  Stable osteoporosis.  The heart is mildly enlarged but stable.  No pericardial effusion. Stable dense aortic and coronary artery calcifications.  No mediastinal or hilar lymphadenopathy.  The esophagus is grossly normal.  Stable emphysematous changes in the lungs.  Stable radiation changes involving the paramediastinal lungs. There are new and enlarging bilateral pulmonary nodules consistent with metastatic pulmonary disease.  No pleural effusions or pulmonary edema.  IMPRESSION:  1. New and enlarging pulmonary nodules consistent with pulmonary metastatic disease. 2.  Stable radiation changes. 3.  Stable emphysematous changes.  CT ABDOMEN AND PELVIS  Findings:  The liver is grossly normal without  contrast.  No obvious metastatic lesions.  No biliary dilatation.  The gallbladder is slightly contracted.  The pancreas is normal.  The spleen is normal in size.  No focal lesions.  Stable bilateral adrenal gland masses consistent with benign adenomas.  No renal lesions.  The stomach, duodenum, small bowel and colon are grossly normal. No mesenteric or retroperitoneal masses or adenopathy.  Stable small scattered lymph nodes.  The aorta demonstrates advanced atherosclerotic calcifications as to the branch vessels.  A remote chronic distal aortic dissection is noted.  There is a stable left adnexal cysts with benign CT imaging features.  No pelvic mass, adenopathy or free pelvic fluid collections.  No inguinal mass or hernia.  Examination of the bony structures demonstrates an enlarging lytic lesion involving the right iliac bone with cortical breakthrough. There is also a destructive lytic lesion involving the right femoral neck with associated soft  tissue mass.  Subsequent placement of a femoral rod.  The lumbar vertebral bodies are intact.  IMPRESSION:  1.  Stable bilateral adrenal gland adenomas. 2.  Stable left adnexal cyst. 3.  Progressive lytic bone lesion involving the right ilium with associated destructive bony change. 4.  Interval placement of a femoral rod across the pathologic lesion near the lesser trochanter. 5.  No CT findings for metastatic hepatic disease or abdominal/pelvic adenopathy.  Original Report Authenticated By: P. Loralie Champagne, M.D.   Ct Angio Chest W/cm &/or Wo Cm  12/07/2011  **ADDENDUM** CREATED: 12/07/2011 05:40:23  The esophagus is mildly distended with an air-fluid level suggesting dysmotility.  **END ADDENDUM** SIGNED BY: Marlon Pel, M.D.   12/07/2011  *RADIOLOGY REPORT*  Clinical Data: Short of breath.  History of lung cancer and COPD. Fall.  CT ANGIOGRAPHY CHEST  Technique:  Multidetector CT imaging of the chest using the standard protocol during bolus administration of intravenous contrast. Multiplanar reconstructed images including MIPs were obtained and reviewed to evaluate the vascular anatomy.  Contrast: OMNIPAQUE IOHEXOL 350 MG/ML SOLN  Comparison: 11/26/2011  Findings: Technically adequate study with good opacification of the central and segmental pulmonary arteries.  There are focal intraluminal filling defects in the scattered bilateral segmental branches consistent with pulmonary emboli.  Cardiac enlargement.  Calcification of the aortic valve and coronary arteries.  Calcification and ectasia of the aorta. Scarring in the mid lungs bilaterally with mass or adenopathy in the right hilum measuring about 15 mm diameter.  Pretracheal lymphadenopathy measures 12 mm short axis dimension.  Multiple pulmonary nodules are again demonstrated bilaterally consistent with metastatic disease.  Probably the largest is a spiculated nodule in the right upper lung measuring 8 mm diameter. Visualization of the lungs is  limited due to respiratory motion artifact.  Diffuse emphysematous changes and fibrosis throughout the lungs.  Atelectasis in the lung bases.  Focal wedge-shaped consolidation in the right lung base posteriorly may represent a focal infarct.  Bronchiectasis and mucus plugging in the lung bases with peribronchial thickening consistent with chronic bronchitis. Bilateral adrenal gland nodules measuring 2.6 cm on the left and 3.3 cm on the right.  These demonstrate low density measurements suggesting adenomas.  Degenerative changes in the thoracic spine. No destructive bone lesions appreciated. Minimal right pleural effusion.  IMPRESSION: Positive study for multiple bilateral segmental pulmonary emboli. Possible small peripheral infarct in the right lung base posteriorly.  Again demonstrated are multiple diffuse bilateral pulmonary metastases, right hilar mass, diffuse emphysematous changes, and fibrosis. Chronic bronchitic changes.  Critical Value/emergent results were called by telephone at the  time of interpretation on 12/07/2011  at 0440 hours  to  Dr. Hyacinth Meeker, who verbally acknowledged these results. Cardiac enlargement.  Bilateral adrenal gland nodules.  Original Report Authenticated By: Marlon Pel, M.D.   Mr Laqueta Jean Wo Contrast  12/07/2011  *RADIOLOGY REPORT*  Clinical Data: 71 year old female with metastatic cancer.  Right upper extremity weakness.  MRI HEAD WITHOUT AND WITH CONTRAST  Technique:  Multiplanar, multiecho pulse sequences of the brain and surrounding structures were obtained according to standard protocol without and with intravenous contrast  Contrast: 10mL MULTIHANCE GADOBENATE DIMEGLUMINE 529 MG/ML IV SOLN  Comparison: Head CTs 12/07/2011 and earlier.  Findings: Multiple small foci of restricted diffusion scattered in the right hemisphere, including the frontal lobe (left upper extremity motor cortex regions series 4 image 18), parietal lobe, and also a fairly dense involvement in the  left occipital pole (series 4 image 7).  Additionally, there is a wedge-shaped infarct with restricted diffusion in the left cerebellum (image 4).  Also, there are two suspicious foci of possible diffusion restriction in the right hemisphere, pre motor cortex on image 20 and possible focal right occipital lobe cortex involvement on image 80.  None of these areas show evidence of hemorrhage or mass effect. Major intracranial vascular flow voids are preserved.  No ventriculomegaly.  No midline shift.  No abnormal enhancement or mass identified in the brain.  Chronic lacunar infarcts occasionally noted in the deep gray matter nuclei and cerebellum.  Overall normal cerebral volume. Negative pituitary and cervicomedullary junction.  Visualized bone marrow signal is within normal limits. Degenerative changes and cervical spine. Visualized orbit soft tissues are within normal limits.  Visualized paranasal sinuses and mastoids are clear.  Negative scalp soft tissues.  IMPRESSION: 1.  Multiple scattered small acute infarcts in the left hemisphere involving both the MCA and PCA territories.  Left upper extremity motor cortex region is affected. 2.  Small wedge-shaped acute infarct also in the left cerebellum. 3.  Suggestion of several punctate infarcts also in the right hemisphere.  This constellation raise the possibility of recent embolic event. 4.  No mass effect or hemorrhage.  Mild for age underlying chronic small vessel ischemia. 5.  No metastatic disease identified.  Original Report Authenticated By: Harley Hallmark, M.D.    Scheduled Meds:   . aspirin  325 mg Oral Daily  . bisoprolol  2.5 mg Oral Daily  . folic acid  1 mg Oral Daily  . heparin  1,500 Units Intravenous Once  . lisinopril  20 mg Oral Daily  . potassium chloride  40 mEq Oral Daily  . potassium chloride  40 mEq Oral Once  . sodium chloride  10-40 mL Intracatheter Q12H  . tiotropium  18 mcg Inhalation Daily   Continuous Infusions:   .  heparin    . DISCONTD: heparin 1,100 Units/hr (12/08/11 0432)

## 2011-12-09 NOTE — Progress Notes (Signed)
Pt continues to refuse to allow foley to be removed.

## 2011-12-09 NOTE — Progress Notes (Signed)
Physical Therapy Treatment Patient Details Name: [name] MRN: [id number] DOB: 1940/10/13 Today's Date: 12/09/2011 Time: 4098-1191 PT Time Calculation (min): 29 min  PT Assessment / Plan / Recommendation Comments on Treatment Session  Pt admitted with Right hip pain, bil PE and is progressing with mobility. Pt continues to be "putting on" per pt phrasing and requires encouragement and reassurance for mobility due to fear of falling and constantly saying she can't do things and then performs said tasks well. Pt encouraged to sit up for lunch but then retrurn to bed since she slept in chair last night due to refusal to move. Nursing staff aware of mobility and pt capability. Continue to recommend ST-SNF unless pt demonstrates significant confidence shift and increased mobility.     Follow Up Recommendations       Barriers to Discharge        Equipment Recommendations  None recommended by OT    Recommendations for Other Services    Frequency     Plan Discharge plan remains appropriate    Precautions / Restrictions Precautions Precautions: Fall Precaution Comments: oxygen   Pertinent Vitals/Pain Pt reports vague Right hip pain without pain at rest and pt premedicated HR 78-82 with activity and sats 93% at rest on 3L and down to 88% with activity    Mobility  Bed Mobility Bed Mobility: Supine to Sit Supine to Sit: 4: Min assist Sitting - Scoot to Edge of Bed: 4: Min guard Details for Bed Mobility Assistance: min cues for safety, technique and hand placement. Transfers Transfers: Sit to Stand;Stand to Sit Sit to Stand: 4: Min guard;From chair/3-in-1;With armrests Stand to Sit: 4: Min guard;To chair/3-in-1;With armrests Details for Transfer Assistance: cueing for hand and RLE placement and sequence of transfer Ambulation/Gait Ambulation/Gait Assistance: 4: Min guard Ambulation Distance (Feet): 90 Feet Assistive device: Rolling walker Ambulation/Gait Assistance Details:  cueing for sequence Gait Pattern: Step-to pattern Stairs: No    Exercises General Exercises - Lower Extremity Long Arc Quad: AROM;Right;15 reps;Seated Hip Flexion/Marching: AAROM;Right;15 reps;Seated   PT Diagnosis:    PT Problem List:   PT Treatment Interventions:     PT Goals Acute Rehab PT Goals PT Goal: Sit to Stand - Progress: Progressing toward goal PT Goal: Stand to Sit - Progress: Progressing toward goal PT Goal: Ambulate - Progress: Progressing toward goal  Visit Information  Last PT Received On: 12/09/11 Assistance Needed: +1    Subjective Data  Subjective: Oh, I can't walk   Cognition  Overall Cognitive Status: Appears within functional limits for tasks assessed/performed Arousal/Alertness: Awake/alert Orientation Level: Appears intact for tasks assessed Behavior During Session: [facility] for tasks performed Cognition - Other Comments: Pt continues to be limited by fear of falling and benefits from encouragement and reassurance    Balance  Static Sitting Balance Static Sitting - Balance Support: No upper extremity supported;Feet supported Static Sitting - Level of Assistance: 5: Stand by assistance Static Standing Balance Static Standing - Balance Support: Bilateral upper extremity supported;During functional activity Static Standing - Level of Assistance: 5: Stand by assistance  End of Session PT - End of Session Equipment Utilized During Treatment: Gait belt Activity Tolerance: Patient tolerated treatment well Patient left: in chair;with call bell/phone within reach Nurse Communication: Mobility status    Delorse Lek 12/09/2011, 11:57 AM Delaney Meigs, PT [phone]

## 2011-12-09 NOTE — Progress Notes (Signed)
ANTICOAGULATION CONSULT NOTE - Follow Up Consult  Pharmacy Consult for Heparin-->Lovenox Indication: Acute PE, Acute CVA  Allergies  Allergen Reactions  . Other Hives    Cheap jewelry.    Patient Measurements: Height: 5' 4.17" (163 cm) Weight: 125 lb 14.1 oz (57.1 kg) IBW/kg (Calculated) : 55.1   Vital Signs: Temp: 98.3 F (36.8 C) (06/04 0410) Temp src: Oral (06/04 0410) BP: 130/74 mmHg (06/04 0410) Pulse Rate: 70  (06/04 0410)  Labs:  Basename 12/09/11 0705 12/08/11 0600 12/07/11 2143 12/07/11 1418 12/07/11 1359 12/07/11 1358 12/07/11 1030 12/07/11 0253 12/07/11 0231  HGB 11.8* 11.1* -- -- -- -- -- -- --  HCT 38.1 36.1 -- -- -- -- -- 38.0 --  PLT 198 190 -- -- -- -- -- -- 196  APTT -- -- -- -- -- -- -- -- --  LABPROT -- -- -- -- -- -- 16.8* -- --  INR -- -- -- -- -- -- 1.34 -- --  HEPARINUNFRC -- 0.40 0.49 <0.10* -- -- -- -- --  CREATININE -- 0.51 -- -- 0.55 -- -- 0.90 --  CKTOTAL -- 34 41 -- -- 53 -- -- --  CKMB -- 3.2 4.0 -- -- 5.0* -- -- --  TROPONINI -- 0.31* 0.50* -- -- 0.67* -- -- --    Estimated Creatinine Clearance: 56.1 ml/min (by C-G formula based on Cr of 0.51).   Medications:  Heparin 1100 units/hr  Assessment: 71yof s/p acute CVA to transition from heparin drip to full dose Lovenox therapy for acute PE. Patient is at increased risk for hypercoagulable state due to metastatic cancer. Neuro has assessed patient and approved the use of Lovenox in the setting of CVA. Heparin level was not drawn this AM but heparin 1100 units/hr was therapeutic x 2 on 6/3. Will time the first dose of Lovenox to be given ~1 hr after heparin discontinuation - Wt: 57kg, CrCl 56 ml/min - H/H and Plts improving - No significant bleeding reported   Goal of Therapy:  Anti-Xa level 0.6-1.2 units/ml 4hrs after LMWH dose given Monitor platelets by anticoagulation protocol: Yes   Plan:  1. Discontinue heparin drip (RN reported ~ 0830) 2. Lovenox 55 mg (~1 mg/kg) SQ q12h - first  dose ~ 0930 3. Monitor CBC and renal function  Cleon Dew [phone] 12/09/2011,8:31 AM

## 2011-12-09 NOTE — Progress Notes (Signed)
Stroke Team Progress Note  HISTORY [name] is an 71 y.o. female history of metastatic lung cancer, history of breast cancer, left hip fracture, recent right hip surgery for pathological disease from bone metastases, history of prior chemotherapy and current radiation therapy, hypertension, status post right upper chest Port-A-Cath placement who presented to the emergency room initially for persistent pain in her right hip and weakness in right upper extremity. While in the ED evaluation a CT pulmonary angiogram was obtained due to tachycardia and hypoxemia, which showed bilateral pulmonary embolism, with possible pulmonary infarct. MRI of brain showed multiple scattered small acute infarcts in the left hemisphere involving both the MCA and PCA territories, small wedge-shaped acute infarct also in the left cerebellum, several punctate infarcts also in the right hemisphere.      SUBJECTIVE She is in chair. Says that she slipped an hurt right  leg and says that she is weak. at the bedside.  Overall she feels her condition is unchanged.   OBJECTIVE Most recent Vital Signs: Filed Vitals:   12/08/11 2006 12/09/11 0000 12/09/11 0410 12/09/11 0805  BP: 100/63 123/78 130/74   Pulse: 67 69 70   Temp: 97.6 F (36.4 C) 97.8 F (36.6 C) 98.3 F (36.8 C)   TempSrc: Oral Oral Oral   Resp: 16 17 18    Height:      Weight:      SpO2: 92% 92% 90% 93%   CBG (last 3)   Basename 12/09/11 0648 12/08/11 2000 12/08/11 1642  GLUCAP 102* 141* 167*   Intake/Output from previous day: 06/03 0701 - 06/04 0700 In: 372 [P.O.:240; I.V.:132] Out: 450 [Urine:450]  IV Fluid Intake:     . DISCONTD: heparin 1,100 Units/hr (12/09/11 0342)    MEDICATIONS    . aspirin  325 mg Oral Daily  . bisoprolol  2.5 mg Oral Daily  . enoxaparin (LOVENOX) injection  55 mg Subcutaneous NOW  . enoxaparin (LOVENOX) injection  55 mg Subcutaneous Q12H  . folic acid  1 mg Oral Daily  . lisinopril  20 mg Oral Daily  .  potassium chloride  40 mEq Oral Q4H  . sodium chloride  10-40 mL Intracatheter Q12H  . tiotropium  18 mcg Inhalation Daily  . DISCONTD: potassium chloride  40 mEq Oral Daily   PRN:  furosemide, oxyCODONE-acetaminophen, sodium chloride  Diet:  Cardiac , thin liquids Activity:  Bedrest DVT Prophylaxis:  lovenox 55 q 12 hours  CLINICALLY SIGNIFICANT STUDIES Basic Metabolic Panel:  Lab 12/09/11 1610 12/08/11 0600 12/07/11 1005  NA 139 136 --  K 5.0 3.2* --  CL 97 92* --  CO2 35* 36* --  GLUCOSE 108* 118* --  BUN 14 14 --  CREATININE 0.43* 0.51 --  CALCIUM 9.5 9.1 --  MG -- -- 1.6  PHOS -- -- --   CBC:  Lab 12/09/11 0705 12/08/11 0600 12/07/11 0231  WBC 8.9 11.3* --  NEUTROABS -- -- 6.2  HGB 11.8* 11.1* --  HCT 38.1 36.1 --  MCV 100.5* 98.6 --  PLT 198 190 --   Coagulation:  Lab 12/07/11 1030  LABPROT 16.8*  INR 1.34   Cardiac Enzymes:  Lab 12/08/11 0600 12/07/11 2143 12/07/11 1358  CKTOTAL 34 41 53  CKMB 3.2 4.0 5.0*  CKMBINDEX -- -- --  TROPONINI 0.31* 0.50* 0.67*   BNP:  Lab 12/07/11 0233  PROBNP 11771.0*   Lipid Panel    Component Value Date/Time   CHOL 131 12/08/2011 0600   TRIG  97 12/08/2011 0600   HDL 40 12/08/2011 0600   CHOLHDL 3.3 12/08/2011 0600   VLDL 19 12/08/2011 0600   LDLCALC 72 12/08/2011 0600   HgbA1C  Lab Results  Component Value Date   HGBA1C 5.6 12/07/2011     Ct Abdomen Pelvis Wo Contrast  11/26/2011  *RADIOLOGY REPORT*  Clinical Data:  History of lung cancer.  Abdominal pain and nausea.  CT CHEST, ABDOMEN AND PELVIS WITHOUT CONTRAST  Technique:  Multidetector CT imaging of the chest, abdomen and pelvis was performed following the standard protocol without IV contrast.  Comparison:  Prior CT scan 08/21/2011.  CT CHEST  Findings:  The chest wall is unremarkable and stable.  The right- sided Port-A-Cath is stable.  No breast masses, supraclavicular or axillary adenopathy.  The thyroid gland appears normal.  Dense vascular calcifications.  The bony  thorax is intact.  Stable osteoporosis.  The heart is mildly enlarged but stable.  No pericardial effusion. Stable dense aortic and coronary artery calcifications.  No mediastinal or hilar lymphadenopathy.  The esophagus is grossly normal.  Stable emphysematous changes in the lungs.  Stable radiation changes involving the paramediastinal lungs. There are new and enlarging bilateral pulmonary nodules consistent with metastatic pulmonary disease.  No pleural effusions or pulmonary edema.  IMPRESSION:  1. New and enlarging pulmonary nodules consistent with pulmonary metastatic disease. 2.  Stable radiation changes. 3.  Stable emphysematous changes.  CT ABDOMEN AND PELVIS  Findings:  The liver is grossly normal without contrast.  No obvious metastatic lesions.  No biliary dilatation.  The gallbladder is slightly contracted.  The pancreas is normal.  The spleen is normal in size.  No focal lesions.  Stable bilateral adrenal gland masses consistent with benign adenomas.  No renal lesions.  The stomach, duodenum, small bowel and colon are grossly normal. No mesenteric or retroperitoneal masses or adenopathy.  Stable small scattered lymph nodes.  The aorta demonstrates advanced atherosclerotic calcifications as to the branch vessels.  A remote chronic distal aortic dissection is noted.  There is a stable left adnexal cysts with benign CT imaging features.  No pelvic mass, adenopathy or free pelvic fluid collections.  No inguinal mass or hernia.  Examination of the bony structures demonstrates an enlarging lytic lesion involving the right iliac bone with cortical breakthrough. There is also a destructive lytic lesion involving the right femoral neck with associated soft tissue mass.  Subsequent placement of a femoral rod.  The lumbar vertebral bodies are intact.  IMPRESSION:  1.  Stable bilateral adrenal gland adenomas. 2.  Stable left adnexal cyst. 3.  Progressive lytic bone lesion involving the right ilium with associated  destructive bony change. 4.  Interval placement of a femoral rod across the pathologic lesion near the lesser trochanter. 5.  No CT findings for metastatic hepatic disease or abdominal/pelvic adenopathy.  Original Report Authenticated By: P. Loralie Champagne, M.D.   Dg Chest 1 View  12/07/2011  *RADIOLOGY REPORT*  Clinical Data: Fall.  History of lung cancer.  CHEST - 1 VIEW  Comparison: Chest 11/11/2011.  The CT chest 11/26/2011.  Findings: Cardiac enlargement.  Normal pulmonary vascularity. Emphysematous changes in the lungs with scattered fibrosis.  Focal scarring in the right upper lung may represent postradiation change.  Spiculated appearing nodular opacities in both hilar regions may be related to scarring but metastasis is not excluded. No focal airspace consolidation.  No blunting of costophrenic angles.  No pneumothorax.  Degenerative changes and scoliosis of the spine.  Calcification  of the aorta.  Right power port type central venous catheter with tip over the mid SVC region.  IMPRESSION: Cardiac enlargement.  Emphysema and fibrosis in the lungs. Scarring bilaterally.  Spiculated appearing nodular opacities in both hilar regions.  No active infiltration.  Original Report Authenticated By: Marlon Pel, M.D.   Dg Chest 2 View  11/11/2011  *RADIOLOGY REPORT*  Clinical Data: Preop radiograph.  CHEST - 2 VIEW  Comparison: 08/21/2011  Findings: There is a right chest wall porta-catheter with tip in the cavoatrial junction.  Heart size is normal.  Chronic, bilateral upper lobe opacities perihilar opacities are identified and appear similar to previous exam.  No superimposed acute airspace consolidation identified.  No pleural effusion or edema.  There is a marked scoliosis deformity involving the thoracic and lumbar spine.  IMPRESSION:  1.  Stable upper lobe perihilar scarring 2.  No acute cardiopulmonary abnormalities  Original Report Authenticated By: Rosealee Albee, M.D.   Dg Hip Operative  Right  11/13/2011  *RADIOLOGY REPORT*  Clinical Data: Hip lesion  DG OPERATIVE RIGHT HIP  Comparison: 11/01/2011  Findings: Three spot images demonstrate a dynamic compression screw and intramedullary rod transfixed in the proximal right femur.  One distal interlocking screw.  Anatomic alignment.  No breakage or loosening of the hardware.  IMPRESSION: Internal fixation proximal right femur.  Original Report Authenticated By: Donavan Burnet, M.D.   Dg Femur Right  12/07/2011  *RADIOLOGY REPORT*  Clinical Data: Pain after fall.  RIGHT FEMUR - 2 VIEW  Comparison: 11/13/2011  Findings: Postoperative changes in the right hip with intramedullary rod and screw and compression screw fixation.  No evidence of acute fracture or displacement.  No focal bone lesion or bone destruction.  Bone cortex and trabecular architecture appear intact.  Vascular calcifications.  Degenerative changes in the knee.  No significant change in positioning of hardware since previous study.  IMPRESSION: Postoperative changes with internal fixation of the proximal right femur.  No acute bony abnormalities identified.  Original Report Authenticated By: Marlon Pel, M.D.   Ct Head Without Contrast  12/07/2011  *RADIOLOGY REPORT*  Clinical Data: Right arm weakness  CT HEAD WITHOUT CONTRAST  Technique:  Contiguous axial images were obtained from the base of the skull through the vertex without contrast.  Comparison: 08/05/2010  Findings: Stable mild brain atrophy most pronounced in the frontal lobes.  No acute intracranial hemorrhage, definite mass lesion, infarction, midline shift, herniation, or hydrocephalus.  Gray- white matter differentiation maintained.  Cisterns patent.  No cerebellar abnormality.  Visualized mastoids and sinuses clear.  IMPRESSION: Stable exam.  No acute intracranial process by noncontrast CT  Original Report Authenticated By: Judie Petit. Ruel Favors, M.D.   Ct Chest Wo Contrast  11/26/2011  *RADIOLOGY REPORT*  Clinical  Data:  History of lung cancer.  Abdominal pain and nausea.  CT CHEST, ABDOMEN AND PELVIS WITHOUT CONTRAST  Technique:  Multidetector CT imaging of the chest, abdomen and pelvis was performed following the standard protocol without IV contrast.  Comparison:  Prior CT scan 08/21/2011.  CT CHEST  Findings:  The chest wall is unremarkable and stable.  The right- sided Port-A-Cath is stable.  No breast masses, supraclavicular or axillary adenopathy.  The thyroid gland appears normal.  Dense vascular calcifications.  The bony thorax is intact.  Stable osteoporosis.  The heart is mildly enlarged but stable.  No pericardial effusion. Stable dense aortic and coronary artery calcifications.  No mediastinal or hilar lymphadenopathy.  The esophagus is  grossly normal.  Stable emphysematous changes in the lungs.  Stable radiation changes involving the paramediastinal lungs. There are new and enlarging bilateral pulmonary nodules consistent with metastatic pulmonary disease.  No pleural effusions or pulmonary edema.  IMPRESSION:  1. New and enlarging pulmonary nodules consistent with pulmonary metastatic disease. 2.  Stable radiation changes. 3.  Stable emphysematous changes.  CT ABDOMEN AND PELVIS  Findings:  The liver is grossly normal without contrast.  No obvious metastatic lesions.  No biliary dilatation.  The gallbladder is slightly contracted.  The pancreas is normal.  The spleen is normal in size.  No focal lesions.  Stable bilateral adrenal gland masses consistent with benign adenomas.  No renal lesions.  The stomach, duodenum, small bowel and colon are grossly normal. No mesenteric or retroperitoneal masses or adenopathy.  Stable small scattered lymph nodes.  The aorta demonstrates advanced atherosclerotic calcifications as to the branch vessels.  A remote chronic distal aortic dissection is noted.  There is a stable left adnexal cysts with benign CT imaging features.  No pelvic mass, adenopathy or free pelvic fluid  collections.  No inguinal mass or hernia.  Examination of the bony structures demonstrates an enlarging lytic lesion involving the right iliac bone with cortical breakthrough. There is also a destructive lytic lesion involving the right femoral neck with associated soft tissue mass.  Subsequent placement of a femoral rod.  The lumbar vertebral bodies are intact.  IMPRESSION:  1.  Stable bilateral adrenal gland adenomas. 2.  Stable left adnexal cyst. 3.  Progressive lytic bone lesion involving the right ilium with associated destructive bony change. 4.  Interval placement of a femoral rod across the pathologic lesion near the lesser trochanter. 5.  No CT findings for metastatic hepatic disease or abdominal/pelvic adenopathy.  Original Report Authenticated By: P. Loralie Champagne, M.D.   Ct Angio Chest W/cm &/or Wo Cm  12/07/2011  **ADDENDUM** CREATED: 12/07/2011 05:40:23  The esophagus is mildly distended with an air-fluid level suggesting dysmotility.  **END ADDENDUM** SIGNED BY: Marlon Pel, M.D.   12/07/2011  *RADIOLOGY REPORT*  Clinical Data: Short of breath.  History of lung cancer and COPD. Fall.  CT ANGIOGRAPHY CHEST  Technique:  Multidetector CT imaging of the chest using the standard protocol during bolus administration of intravenous contrast. Multiplanar reconstructed images including MIPs were obtained and reviewed to evaluate the vascular anatomy.  Contrast: OMNIPAQUE IOHEXOL 350 MG/ML SOLN  Comparison: 11/26/2011  Findings: Technically adequate study with good opacification of the central and segmental pulmonary arteries.  There are focal intraluminal filling defects in the scattered bilateral segmental branches consistent with pulmonary emboli.  Cardiac enlargement.  Calcification of the aortic valve and coronary arteries.  Calcification and ectasia of the aorta. Scarring in the mid lungs bilaterally with mass or adenopathy in the right hilum measuring about 15 mm diameter.  Pretracheal  lymphadenopathy measures 12 mm short axis dimension.  Multiple pulmonary nodules are again demonstrated bilaterally consistent with metastatic disease.  Probably the largest is a spiculated nodule in the right upper lung measuring 8 mm diameter. Visualization of the lungs is limited due to respiratory motion artifact.  Diffuse emphysematous changes and fibrosis throughout the lungs.  Atelectasis in the lung bases.  Focal wedge-shaped consolidation in the right lung base posteriorly may represent a focal infarct.  Bronchiectasis and mucus plugging in the lung bases with peribronchial thickening consistent with chronic bronchitis. Bilateral adrenal gland nodules measuring 2.6 cm on the left and 3.3 cm on the  right.  These demonstrate low density measurements suggesting adenomas.  Degenerative changes in the thoracic spine. No destructive bone lesions appreciated. Minimal right pleural effusion.  IMPRESSION: Positive study for multiple bilateral segmental pulmonary emboli. Possible small peripheral infarct in the right lung base posteriorly.  Again demonstrated are multiple diffuse bilateral pulmonary metastases, right hilar mass, diffuse emphysematous changes, and fibrosis. Chronic bronchitic changes.  Critical Value/emergent results were called by telephone at the time of interpretation on 12/07/2011  at 0440 hours  to  Dr. Hyacinth Meeker, who verbally acknowledged these results. Cardiac enlargement.  Bilateral adrenal gland nodules.  Original Report Authenticated By: Marlon Pel, M.D.   Mr Laqueta Jean Wo Contrast  12/07/2011  *RADIOLOGY REPORT*  Clinical Data: 71 year old female with metastatic cancer.  Right upper extremity weakness.  MRI HEAD WITHOUT AND WITH CONTRAST  Technique:  Multiplanar, multiecho pulse sequences of the brain and surrounding structures were obtained according to standard protocol without and with intravenous contrast  Contrast: 10mL MULTIHANCE GADOBENATE DIMEGLUMINE 529 MG/ML IV SOLN   Comparison: Head CTs 12/07/2011 and earlier.  Findings: Multiple small foci of restricted diffusion scattered in the right hemisphere, including the frontal lobe (left upper extremity motor cortex regions series 4 image 18), parietal lobe, and also a fairly dense involvement in the left occipital pole (series 4 image 7).  Additionally, there is a wedge-shaped infarct with restricted diffusion in the left cerebellum (image 4).  Also, there are two suspicious foci of possible diffusion restriction in the right hemisphere, pre motor cortex on image 20 and possible focal right occipital lobe cortex involvement on image 80.  None of these areas show evidence of hemorrhage or mass effect. Major intracranial vascular flow voids are preserved.  No ventriculomegaly.  No midline shift.  No abnormal enhancement or mass identified in the brain.  Chronic lacunar infarcts occasionally noted in the deep gray matter nuclei and cerebellum.  Overall normal cerebral volume. Negative pituitary and cervicomedullary junction.  Visualized bone marrow signal is within normal limits. Degenerative changes and cervical spine. Visualized orbit soft tissues are within normal limits.  Visualized paranasal sinuses and mastoids are clear.  Negative scalp soft tissues.  IMPRESSION: 1.  Multiple scattered small acute infarcts in the left hemisphere involving both the MCA and PCA territories.  Left upper extremity motor cortex region is affected. 2.  Small wedge-shaped acute infarct also in the left cerebellum. 3.  Suggestion of several punctate infarcts also in the right hemisphere.  This constellation raise the possibility of recent embolic event. 4.  No mass effect or hemorrhage.  Mild for age underlying chronic small vessel ischemia. 5.  No metastatic disease identified.  Original Report Authenticated By: Ulla Potash III, M.D.   EKG  normal EKG, normal sinus rhythm, unchanged from previous tracings, sinus tachycardia.   Therapy  Recommendations: home therapy  Physical Exam  Elderly lady not in distress. Head is nontraumatic. Neck is supple without bruit. Hearing is normal. Cardiac exam no murmur or gallop. Lungs are clear to auscultation. Distal pulses are well felt.  Neurological Exam ; awake alert disoriented. Diminished attention, registration and recall.speech language normal. No dysarthria or aphasia. Eye movements are full.face symmetric.tongue midline. Moves all 4 limbs. Mild right hand and grip weak. Moves right leg less than left.no sensory loss.gait deferred.   ASSESSMENT Ms. [name] is a 71 y.o. female with a left hemisphere infarcts invovling the MCA and PCA territories.In addition, acute infarct noted in left cerebellum.  On lovenox prior to  admission and will continue due to metastases and pulmonary embolism.  Hospital day # 2  TREATMENT/PLAN -Recommend warfarin over lovenox for secondary stroke prevention due to increase hemorrhage risk. Patient has a history of malignant neoplasm with metastases with pulmonary embolism as demonstrated on chest CT.  Guy Franco PA-C Pager [phone]   I have personally examined this patient, reviewed data and developed plan of care and agree with above. Delia Heady, MD Medical Director [facility] Pager: [phone] 12/09/2011 11:05 PM

## 2011-12-09 NOTE — Progress Notes (Signed)
Occupational Therapy Treatment Patient Details Name: [name] MRN: [id number] DOB: Jul 23, 1940 Today's Date: 12/09/2011 Time: 5621-3086 OT Time Calculation (min): 26 min  OT Assessment / Plan / Recommendation Comments on Treatment Session Pt doing much better this am. Agreeable to get OOB with minimal prompting. Con't to recomend ST SNF unless family is able to provide necessary level of A at home.    Follow Up Recommendations  Skilled nursing facility;Supervision/Assistance - 24 hour;Home health OT    Barriers to Discharge       Equipment Recommendations  None recommended by OT    Recommendations for Other Services    Frequency Min 2X/week   Plan Discharge plan remains appropriate    Precautions / Restrictions Precautions Precautions: Fall Restrictions Weight Bearing Restrictions: No   Pertinent Vitals/Pain     ADL  Grooming: Performed;Teeth care;Brushing hair;Set up Where Assessed - Grooming: Unsupported sitting Toilet Transfer: Simulated;Minimal assistance Toilet Transfer Method: Sit to stand Toilet Transfer Equipment:  (to recliner) ADL Comments: Much less cueing and prompting needed for pt participation this am. Pt still w/self limiting behaviors and c/o mild R hip pain. Pt ambulated around bed to recliner with minguard A and max VCs for safe technique with RW and step sequence.    OT Diagnosis:    OT Problem List:   OT Treatment Interventions:     OT Goals ADL Goals ADL Goal: Grooming - Progress: Progressing toward goals ADL Goal: Toilet Transfer - Progress: Progressing toward goals  Visit Information  Last OT Received On: 12/09/11 Assistance Needed: +1    Subjective Data  Subjective: Oh I cant!   Prior Functioning       Cognition  Overall Cognitive Status: Appears within functional limits for tasks assessed/performed Arousal/Alertness: Awake/alert Orientation Level: Appears intact for tasks assessed Behavior During Session: [facility] for tasks  performed Cognition - Other Comments: Pt still limited by fear of falling.    Mobility Bed Mobility Bed Mobility: Supine to Sit Supine to Sit: 4: Min assist Sitting - Scoot to Edge of Bed: 4: Min guard Details for Bed Mobility Assistance: min cues for safety, technique and hand placement. Transfers Sit to Stand: 4: Min assist;With upper extremity assist;From bed Stand to Sit: 4: Min assist;With upper extremity assist;To chair/3-in-1;With armrests Details for Transfer Assistance: min VCs for hand placement   Exercises    Balance Static Sitting Balance Static Sitting - Balance Support: No upper extremity supported;Feet supported Static Sitting - Level of Assistance: 5: Stand by assistance Static Standing Balance Static Standing - Balance Support: Bilateral upper extremity supported;During functional activity Static Standing - Level of Assistance: 4: Min assist  End of Session OT - End of Session Activity Tolerance: Patient tolerated treatment well Patient left: in chair;with call bell/phone within reach   [name] A OTR/L [phone] 12/09/2011, 9:37 AM

## 2011-12-09 NOTE — Clinical Social Work Psychosocial (Signed)
     Clinical Social Work Department BRIEF PSYCHOSOCIAL ASSESSMENT 12/09/2011  Patient:  [name], [name]     Account Number:  [id number]     Admit date:  12/07/2011  Clinical Social Worker:  Robin Searing  Date/Time:  12/09/2011 03:55 PM  Referred by:  RN  Date Referred:  12/09/2011 Referred for  SNF Placement   Other Referral:   Interview type:  Patient Other interview type:    PSYCHOSOCIAL DATA Living Status:  FAMILY Admitted from facility:   Level of care:   Primary support name:  Genia Harold Primary support relationship to patient:  FAMILY Degree of support available:   good    CURRENT CONCERNS Current Concerns  Post-Acute Placement   Other Concerns:    SOCIAL WORK ASSESSMENT / PLAN Patient agreeable to conisdering SNF at d/c. Lives with family in [place] area   Assessment/plan status:  Other - See comment Other assessment/ plan:   Information/referral to community resources:    PATIENTS/FAMILYS RESPONSE TO PLAN OF CARE: Agreeable to SNF search- will proceed with FL2, [place] and SNF search

## 2011-12-10 ENCOUNTER — Ambulatory Visit: Payer: No Typology Code available for payment source

## 2011-12-10 DIAGNOSIS — G459 Transient cerebral ischemic attack, unspecified: Secondary | ICD-10-CM

## 2011-12-10 DIAGNOSIS — I2699 Other pulmonary embolism without acute cor pulmonale: Secondary | ICD-10-CM

## 2011-12-10 DIAGNOSIS — E876 Hypokalemia: Secondary | ICD-10-CM

## 2011-12-10 DIAGNOSIS — Z85118 Personal history of other malignant neoplasm of bronchus and lung: Secondary | ICD-10-CM

## 2011-12-10 LAB — CBC
HCT: 34.1 % — ABNORMAL LOW (ref 36.0–46.0)
Hemoglobin: 10.5 g/dL — ABNORMAL LOW (ref 12.0–15.0)
MCH: 30.5 pg (ref 26.0–34.0)
RBC: 3.44 MIL/uL — ABNORMAL LOW (ref 3.87–5.11)

## 2011-12-10 LAB — GLUCOSE, CAPILLARY: Glucose-Capillary: 89 mg/dL (ref 70–99)

## 2011-12-10 LAB — BASIC METABOLIC PANEL
BUN: 11 mg/dL (ref 6–23)
CO2: 35 mEq/L — ABNORMAL HIGH (ref 19–32)
Calcium: 9.2 mg/dL (ref 8.4–10.5)
Glucose, Bld: 98 mg/dL (ref 70–99)
Sodium: 138 mEq/L (ref 135–145)

## 2011-12-10 MED ORDER — FUROSEMIDE 20 MG PO TABS
20.0000 mg | ORAL_TABLET | Freq: Every day | ORAL | Status: DC
  Administered 2011-12-11: 20 mg via ORAL
  Filled 2011-12-10 (×2): qty 1

## 2011-12-10 MED ORDER — OXYCODONE HCL 15 MG PO TB12
15.0000 mg | ORAL_TABLET | Freq: Two times a day (BID) | ORAL | Status: DC
  Administered 2011-12-10 – 2011-12-13 (×7): 15 mg via ORAL
  Filled 2011-12-10 (×7): qty 1

## 2011-12-10 NOTE — Progress Notes (Signed)
FL2 has been faxed out to area SNF's for offers- will await offers and advise- Reece Levy, MSW, [employer] [phone]

## 2011-12-10 NOTE — Progress Notes (Signed)
Patient still refused to discontinue foley catheter,[name] NP made aware.

## 2011-12-10 NOTE — Progress Notes (Addendum)
[facility] PROGRESS NOTE  [name] ZOX:[id number] DOB: 1940-07-09 DOA: 12/07/2011 PCP: [name], MD, MD  Assessment/Plan: CVA (cerebral infarction)  Patient with acute CVA involving MCA and PCA territories on MRI. Currently on aspirin 325 mg and on Lovenox given concurrent PE, will D/W Dr.Sethi regarding risk benefit of starting coumadin vs Lovenox, in setting of Acute PE     Malignant neoplasm of bronchus and lung, unspecified site/Secondary malignant neoplasm of bone and bone marrow Discussed with Dr. her Shirline Frees. No intervention at this time. Dr.Davis also D/W Radiology/Oncology, will need to resume XRT to R hip after discharge   COPD (chronic obstructive pulmonary disease) Patient has COPD but was not on oxygen at home. Continue oxygen and nebulizer when necessary.  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  Will start OxyContin Needs to resume XRT to R hip Physical therapy evaluated patient and recommend short-term skilled nursing facility, ask CSW to see  Acute Pulmonary embolism Currently on Lovenox, ideal method of anticoagulation given metastatic CA, could start coumadin if risk of bleeding given Acute CVA is too high  Hypokalemia Resolved  Code Status: DNR Family Communication: None Disposition Plan: Short-term skilled nursing when bed available [name], MD  [facility] Pager [phone]  If 8PM-8AM, please contact night-coverage [email or web address] Password [email or web address] 12/09/2011, 10:35 AM   LOS: 1 day   Brief narrative: None  Consultants:  Neurology: Dr. Roseanne Reno  Oncology: Dr. Jerolyn Center notified  Procedures:  See Below  Antibiotics:  None ?  HPI/Subjective: Continues to c/o R hip pain, breathing fair, coughing    Objective: Filed Vitals:   12/08/11 0426 12/08/11 0432 12/08/11 0800 12/08/11 0823  BP:  123/77  139/88   Pulse: 83  94   Temp: 97.7 F (36.5 C)  98.4 F (36.9 C)   TempSrc: Oral  Oral   Resp: 18  18   Height:      Weight:      SpO2: 91% 91% 91% 89%    Intake/Output Summary (Last 24 hours) at 12/08/11 1035 Last data filed at 12/08/11 0432  Gross per 24 hour  Intake    480 ml  Output   1050 ml  Net   -570 ml    Exam:   General: Patient is sitting up in chair . She did does not seem to be in any acute distress.   Cardiovascular: Regular rate rhythm  Respiratory: Clear to auscultation bilaterally  Abdomen: Soft positive bowel   Extremities: 1+ edema Neuro:R UE 3/5 , rest unremarkable  Data Reviewed: Basic Metabolic Panel:  Lab 12/08/11 8295 12/07/11 1359 12/07/11 1005 12/07/11 0253  NA 136 141 -- 139  K 3.2* 3.1* -- --  CL 92* 95* -- 89*  CO2 36* >45* -- --  GLUCOSE 118* 144* -- 119*  BUN 14 14 -- 15  CREATININE 0.51 0.55 -- 0.90  CALCIUM 9.1 9.2 -- --  MG -- -- 1.6 --  PHOS -- -- -- --   CBC:  Lab 12/08/11 0600 12/07/11 0253 12/07/11 0231  WBC 11.3* -- 8.1  NEUTROABS -- -- 6.2  HGB 11.1* 12.9 11.4*  HCT 36.1 38.0 36.6  MCV 98.6 -- 97.3  PLT 190 -- 196   Cardiac Enzymes:  Lab 12/08/11 0600 12/07/11 2143 12/07/11 1358  CKTOTAL 34 41 53  CKMB 3.2 4.0 5.0*  CKMBINDEX -- -- --  TROPONINI 0.31* 0.50* 0.67*  CBG:  Lab 12/07/11 2118  GLUCAP 118*    Studies: Ct Abdomen Pelvis Wo Contrast  11/26/2011  *RADIOLOGY REPORT*  Clinical Data:  History of lung cancer.  Abdominal pain and nausea.  CT CHEST, ABDOMEN AND PELVIS WITHOUT CONTRAST  Technique:  Multidetector CT imaging of the chest, abdomen and pelvis was performed following the standard protocol without IV contrast.  Comparison:  Prior CT scan 08/21/2011.  CT CHEST  Findings:  The chest wall is unremarkable and stable.  The right- sided Port-A-Cath is stable.  No breast masses, supraclavicular or axillary adenopathy.  The thyroid gland appears normal.  Dense vascular calcifications.  The  bony thorax is intact.  Stable osteoporosis.  The heart is mildly enlarged but stable.  No pericardial effusion. Stable dense aortic and coronary artery calcifications.  No mediastinal or hilar lymphadenopathy.  The esophagus is grossly normal.  Stable emphysematous changes in the lungs.  Stable radiation changes involving the paramediastinal lungs. There are new and enlarging bilateral pulmonary nodules consistent with metastatic pulmonary disease.  No pleural effusions or pulmonary edema.  IMPRESSION:  1. New and enlarging pulmonary nodules consistent with pulmonary metastatic disease. 2.  Stable radiation changes. 3.  Stable emphysematous changes.  CT ABDOMEN AND PELVIS  Findings:  The liver is grossly normal without contrast.  No obvious metastatic lesions.  No biliary dilatation.  The gallbladder is slightly contracted.  The pancreas is normal.  The spleen is normal in size.  No focal lesions.  Stable bilateral adrenal gland masses consistent with benign adenomas.  No renal lesions.  The stomach, duodenum, small bowel and colon are grossly normal. No mesenteric or retroperitoneal masses or adenopathy.  Stable small scattered lymph nodes.  The aorta demonstrates advanced atherosclerotic calcifications as to the branch vessels.  A remote chronic distal aortic dissection is noted.  There is a stable left adnexal cysts with benign CT imaging features.  No pelvic mass, adenopathy or free pelvic fluid collections.  No inguinal mass or hernia.  Examination of the bony structures demonstrates an enlarging lytic lesion involving the right iliac bone with cortical breakthrough. There is also a destructive lytic lesion involving the right femoral neck with associated soft tissue mass.  Subsequent placement of a femoral rod.  The lumbar vertebral bodies are intact.  IMPRESSION:  1.  Stable bilateral adrenal gland adenomas. 2.  Stable left adnexal cyst. 3.  Progressive lytic bone lesion involving the right ilium with  associated destructive bony change. 4.  Interval placement of a femoral rod across the pathologic lesion near the lesser trochanter. 5.  No CT findings for metastatic hepatic disease or abdominal/pelvic adenopathy.  Original Report Authenticated By: P. Loralie Champagne, M.D.   Dg Chest 1 View  12/07/2011  *RADIOLOGY REPORT*  Clinical Data: Fall.  History of lung cancer.  CHEST - 1 VIEW  Comparison: Chest 11/11/2011.  The CT chest 11/26/2011.  Findings: Cardiac enlargement.  Normal pulmonary vascularity. Emphysematous changes in the lungs with scattered fibrosis.  Focal scarring in the right upper lung may represent postradiation change.  Spiculated appearing nodular opacities in both hilar regions may be related to scarring but metastasis is not excluded. No focal airspace consolidation.  No blunting of costophrenic angles.  No pneumothorax.  Degenerative changes and scoliosis of the spine.  Calcification of the aorta.  Right power port type central venous catheter with tip over the mid SVC region.  IMPRESSION: Cardiac enlargement.  Emphysema and fibrosis in the lungs. Scarring bilaterally.  Spiculated appearing  nodular opacities in both hilar regions.  No active infiltration.  Original Report Authenticated By: Marlon Pel, M.D.   Dg Chest 2 View  11/11/2011  *RADIOLOGY REPORT*  Clinical Data: Preop radiograph.  CHEST - 2 VIEW  Comparison: 08/21/2011  Findings: There is a right chest wall porta-catheter with tip in the cavoatrial junction.  Heart size is normal.  Chronic, bilateral upper lobe opacities perihilar opacities are identified and appear similar to previous exam.  No superimposed acute airspace consolidation identified.  No pleural effusion or edema.  There is a marked scoliosis deformity involving the thoracic and lumbar spine.  IMPRESSION:  1.  Stable upper lobe perihilar scarring 2.  No acute cardiopulmonary abnormalities  Original Report Authenticated By: Rosealee Albee, M.D.   Dg Hip  Operative Right  11/13/2011  *RADIOLOGY REPORT*  Clinical Data: Hip lesion  DG OPERATIVE RIGHT HIP  Comparison: 11/01/2011  Findings: Three spot images demonstrate a dynamic compression screw and intramedullary rod transfixed in the proximal right femur.  One distal interlocking screw.  Anatomic alignment.  No breakage or loosening of the hardware.  IMPRESSION: Internal fixation proximal right femur.  Original Report Authenticated By: Donavan Burnet, M.D.   Dg Femur Right  12/07/2011  *RADIOLOGY REPORT*  Clinical Data: Pain after fall.  RIGHT FEMUR - 2 VIEW  Comparison: 11/13/2011  Findings: Postoperative changes in the right hip with intramedullary rod and screw and compression screw fixation.  No evidence of acute fracture or displacement.  No focal bone lesion or bone destruction.  Bone cortex and trabecular architecture appear intact.  Vascular calcifications.  Degenerative changes in the knee.  No significant change in positioning of hardware since previous study.  IMPRESSION: Postoperative changes with internal fixation of the proximal right femur.  No acute bony abnormalities identified.  Original Report Authenticated By: Marlon Pel, M.D.   Ct Head Without Contrast  12/07/2011  *RADIOLOGY REPORT*  Clinical Data: Right arm weakness  CT HEAD WITHOUT CONTRAST  Technique:  Contiguous axial images were obtained from the base of the skull through the vertex without contrast.  Comparison: 08/05/2010  Findings: Stable mild brain atrophy most pronounced in the frontal lobes.  No acute intracranial hemorrhage, definite mass lesion, infarction, midline shift, herniation, or hydrocephalus.  Gray- white matter differentiation maintained.  Cisterns patent.  No cerebellar abnormality.  Visualized mastoids and sinuses clear.  IMPRESSION: Stable exam.  No acute intracranial process by noncontrast CT  Original Report Authenticated By: Judie Petit. Ruel Favors, M.D.   Ct Chest Wo Contrast  11/26/2011  *RADIOLOGY REPORT*   Clinical Data:  History of lung cancer.  Abdominal pain and nausea.  CT CHEST, ABDOMEN AND PELVIS WITHOUT CONTRAST  Technique:  Multidetector CT imaging of the chest, abdomen and pelvis was performed following the standard protocol without IV contrast.  Comparison:  Prior CT scan 08/21/2011.  CT CHEST  Findings:  The chest wall is unremarkable and stable.  The right- sided Port-A-Cath is stable.  No breast masses, supraclavicular or axillary adenopathy.  The thyroid gland appears normal.  Dense vascular calcifications.  The bony thorax is intact.  Stable osteoporosis.  The heart is mildly enlarged but stable.  No pericardial effusion. Stable dense aortic and coronary artery calcifications.  No mediastinal or hilar lymphadenopathy.  The esophagus is grossly normal.  Stable emphysematous changes in the lungs.  Stable radiation changes involving the paramediastinal lungs. There are new and enlarging bilateral pulmonary nodules consistent with metastatic pulmonary disease.  No pleural effusions  or pulmonary edema.  IMPRESSION:  1. New and enlarging pulmonary nodules consistent with pulmonary metastatic disease. 2.  Stable radiation changes. 3.  Stable emphysematous changes.  CT ABDOMEN AND PELVIS  Findings:  The liver is grossly normal without contrast.  No obvious metastatic lesions.  No biliary dilatation.  The gallbladder is slightly contracted.  The pancreas is normal.  The spleen is normal in size.  No focal lesions.  Stable bilateral adrenal gland masses consistent with benign adenomas.  No renal lesions.  The stomach, duodenum, small bowel and colon are grossly normal. No mesenteric or retroperitoneal masses or adenopathy.  Stable small scattered lymph nodes.  The aorta demonstrates advanced atherosclerotic calcifications as to the branch vessels.  A remote chronic distal aortic dissection is noted.  There is a stable left adnexal cysts with benign CT imaging features.  No pelvic mass, adenopathy or free pelvic  fluid collections.  No inguinal mass or hernia.  Examination of the bony structures demonstrates an enlarging lytic lesion involving the right iliac bone with cortical breakthrough. There is also a destructive lytic lesion involving the right femoral neck with associated soft tissue mass.  Subsequent placement of a femoral rod.  The lumbar vertebral bodies are intact.  IMPRESSION:  1.  Stable bilateral adrenal gland adenomas. 2.  Stable left adnexal cyst. 3.  Progressive lytic bone lesion involving the right ilium with associated destructive bony change. 4.  Interval placement of a femoral rod across the pathologic lesion near the lesser trochanter. 5.  No CT findings for metastatic hepatic disease or abdominal/pelvic adenopathy.  Original Report Authenticated By: P. Loralie Champagne, M.D.   Ct Angio Chest W/cm &/or Wo Cm  12/07/2011  **ADDENDUM** CREATED: 12/07/2011 05:40:23  The esophagus is mildly distended with an air-fluid level suggesting dysmotility.  **END ADDENDUM** SIGNED BY: Marlon Pel, M.D.   12/07/2011  *RADIOLOGY REPORT*  Clinical Data: Short of breath.  History of lung cancer and COPD. Fall.  CT ANGIOGRAPHY CHEST  Technique:  Multidetector CT imaging of the chest using the standard protocol during bolus administration of intravenous contrast. Multiplanar reconstructed images including MIPs were obtained and reviewed to evaluate the vascular anatomy.  Contrast: OMNIPAQUE IOHEXOL 350 MG/ML SOLN  Comparison: 11/26/2011  Findings: Technically adequate study with good opacification of the central and segmental pulmonary arteries.  There are focal intraluminal filling defects in the scattered bilateral segmental branches consistent with pulmonary emboli.  Cardiac enlargement.  Calcification of the aortic valve and coronary arteries.  Calcification and ectasia of the aorta. Scarring in the mid lungs bilaterally with mass or adenopathy in the right hilum measuring about 15 mm diameter.   Pretracheal lymphadenopathy measures 12 mm short axis dimension.  Multiple pulmonary nodules are again demonstrated bilaterally consistent with metastatic disease.  Probably the largest is a spiculated nodule in the right upper lung measuring 8 mm diameter. Visualization of the lungs is limited due to respiratory motion artifact.  Diffuse emphysematous changes and fibrosis throughout the lungs.  Atelectasis in the lung bases.  Focal wedge-shaped consolidation in the right lung base posteriorly may represent a focal infarct.  Bronchiectasis and mucus plugging in the lung bases with peribronchial thickening consistent with chronic bronchitis. Bilateral adrenal gland nodules measuring 2.6 cm on the left and 3.3 cm on the right.  These demonstrate low density measurements suggesting adenomas.  Degenerative changes in the thoracic spine. No destructive bone lesions appreciated. Minimal right pleural effusion.  IMPRESSION: Positive study for multiple bilateral segmental pulmonary  emboli. Possible small peripheral infarct in the right lung base posteriorly.  Again demonstrated are multiple diffuse bilateral pulmonary metastases, right hilar mass, diffuse emphysematous changes, and fibrosis. Chronic bronchitic changes.  Critical Value/emergent results were called by telephone at the time of interpretation on 12/07/2011  at 0440 hours  to  Dr. Hyacinth Meeker, who verbally acknowledged these results. Cardiac enlargement.  Bilateral adrenal gland nodules.  Original Report Authenticated By: Marlon Pel, M.D.   Mr Laqueta Jean Wo Contrast  12/07/2011  *RADIOLOGY REPORT*  Clinical Data: 71 year old female with metastatic cancer.  Right upper extremity weakness.  MRI HEAD WITHOUT AND WITH CONTRAST  Technique:  Multiplanar, multiecho pulse sequences of the brain and surrounding structures were obtained according to standard protocol without and with intravenous contrast  Contrast: 10mL MULTIHANCE GADOBENATE DIMEGLUMINE 529 MG/ML IV  SOLN  Comparison: Head CTs 12/07/2011 and earlier.  Findings: Multiple small foci of restricted diffusion scattered in the right hemisphere, including the frontal lobe (left upper extremity motor cortex regions series 4 image 18), parietal lobe, and also a fairly dense involvement in the left occipital pole (series 4 image 7).  Additionally, there is a wedge-shaped infarct with restricted diffusion in the left cerebellum (image 4).  Also, there are two suspicious foci of possible diffusion restriction in the right hemisphere, pre motor cortex on image 20 and possible focal right occipital lobe cortex involvement on image 80.  None of these areas show evidence of hemorrhage or mass effect. Major intracranial vascular flow voids are preserved.  No ventriculomegaly.  No midline shift.  No abnormal enhancement or mass identified in the brain.  Chronic lacunar infarcts occasionally noted in the deep gray matter nuclei and cerebellum.  Overall normal cerebral volume. Negative pituitary and cervicomedullary junction.  Visualized bone marrow signal is within normal limits. Degenerative changes and cervical spine. Visualized orbit soft tissues are within normal limits.  Visualized paranasal sinuses and mastoids are clear.  Negative scalp soft tissues.  IMPRESSION: 1.  Multiple scattered small acute infarcts in the left hemisphere involving both the MCA and PCA territories.  Left upper extremity motor cortex region is affected. 2.  Small wedge-shaped acute infarct also in the left cerebellum. 3.  Suggestion of several punctate infarcts also in the right hemisphere.  This constellation raise the possibility of recent embolic event. 4.  No mass effect or hemorrhage.  Mild for age underlying chronic small vessel ischemia. 5.  No metastatic disease identified.  Original Report Authenticated By: Harley Hallmark, M.D.    Scheduled Meds:   . aspirin  325 mg Oral Daily  . bisoprolol  2.5 mg Oral Daily  . folic acid  1 mg Oral  Daily  . heparin  1,500 Units Intravenous Once  . lisinopril  20 mg Oral Daily  . potassium chloride  40 mEq Oral Daily  . potassium chloride  40 mEq Oral Once  . sodium chloride  10-40 mL Intracatheter Q12H  . tiotropium  18 mcg Inhalation Daily   Continuous Infusions:   . heparin    . DISCONTD: heparin 1,100 Units/hr (12/08/11 0432)

## 2011-12-11 ENCOUNTER — Ambulatory Visit: Payer: No Typology Code available for payment source | Admitting: Physician Assistant

## 2011-12-11 ENCOUNTER — Inpatient Hospital Stay (HOSPITAL_COMMUNITY): Payer: No Typology Code available for payment source

## 2011-12-11 ENCOUNTER — Ambulatory Visit: Payer: No Typology Code available for payment source

## 2011-12-11 ENCOUNTER — Other Ambulatory Visit: Payer: No Typology Code available for payment source | Admitting: Lab

## 2011-12-11 DIAGNOSIS — G459 Transient cerebral ischemic attack, unspecified: Secondary | ICD-10-CM

## 2011-12-11 DIAGNOSIS — Z85118 Personal history of other malignant neoplasm of bronchus and lung: Secondary | ICD-10-CM

## 2011-12-11 DIAGNOSIS — E876 Hypokalemia: Secondary | ICD-10-CM

## 2011-12-11 DIAGNOSIS — I2699 Other pulmonary embolism without acute cor pulmonale: Secondary | ICD-10-CM

## 2011-12-11 LAB — CBC
MCH: 30.2 pg (ref 26.0–34.0)
MCHC: 30.5 g/dL (ref 30.0–36.0)
RDW: 17.3 % — ABNORMAL HIGH (ref 11.5–15.5)

## 2011-12-11 MED ORDER — ALBUTEROL SULFATE (5 MG/ML) 0.5% IN NEBU
2.5000 mg | INHALATION_SOLUTION | Freq: Four times a day (QID) | RESPIRATORY_TRACT | Status: DC | PRN
  Filled 2011-12-11: qty 0.5

## 2011-12-11 MED ORDER — ALBUTEROL SULFATE (5 MG/ML) 0.5% IN NEBU
2.5000 mg | INHALATION_SOLUTION | Freq: Three times a day (TID) | RESPIRATORY_TRACT | Status: DC
  Administered 2011-12-11 – 2011-12-12 (×4): 2.5 mg via RESPIRATORY_TRACT
  Filled 2011-12-11 (×3): qty 0.5

## 2011-12-11 MED ORDER — ALBUTEROL SULFATE (5 MG/ML) 0.5% IN NEBU: 2.5000 mg | INHALATION_SOLUTION | RESPIRATORY_TRACT | Status: DC | PRN

## 2011-12-11 MED ORDER — ALBUTEROL SULFATE (5 MG/ML) 0.5% IN NEBU
2.5000 mg | INHALATION_SOLUTION | Freq: Four times a day (QID) | RESPIRATORY_TRACT | Status: DC
  Administered 2011-12-11: 2.5 mg via RESPIRATORY_TRACT
  Filled 2011-12-11 (×3): qty 0.5

## 2011-12-11 NOTE — Progress Notes (Signed)
[facility] PROGRESS NOTE  [name]:[id number] DOB: 1940/12/07 DOA: 12/07/2011 PCP: [name], MD, MD  Assessment/Plan: CVA (cerebral infarction)  Patient with acute CVA involving MCA and PCA territories on MRI. D/W Neurology yesterday, continue Lovenox, Aspirin Dced, given concurrent PE   Malignant neoplasm of bronchus and lung, unspecified site/Secondary malignant neoplasm of bone and bone marrow Dr.Davis D/w Dr. Shirline Frees, and Rad Onc, No intervention at this time.   will need to resume XRT to R hip after discharge   COPD (chronic obstructive pulmonary disease)  Continue oxygen and nebulizer scheduled and PRN.  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  Continue  OxyContin Needs to resume XRT to R hip Physical therapy evaluated patient and recommend short-term skilled nursing facility, ask CSW to see  Acute Pulmonary embolism Currently on Lovenox, ideal method of anticoagulation given metastatic CA  Hypokalemia Resolved  Code Status: DNR Family Communication: None Disposition Plan: Short-term skilled nursing tomorrow if stable Wendy Cove, MD  [facility] Pager [phone]  If 8PM-8AM, please contact night-coverage [email or web address] Password [email or web address] 12/09/2011, 10:35 AM   LOS: 1 day   Brief narrative: None  Consultants:  Neurology: Dr. Roseanne Reno  Oncology: Dr. Jerolyn Center notified  Procedures:  See Below  Antibiotics:  None ?  HPI/Subjective: Continues to c/o R hip pain, breathing fair, coughing    Objective: Filed Vitals:   12/08/11 0426 12/08/11 0432 12/08/11 0800 12/08/11 0823  BP: 123/77  139/88   Pulse: 83  94   Temp: 97.7 F (36.5 C)  98.4 F (36.9 C)   TempSrc: Oral  Oral   Resp: 18  18   Height:      Weight:       SpO2: 91% 91% 91% 89%    Intake/Output Summary (Last 24 hours) at 12/08/11 1035 Last data filed at 12/08/11 0432  Gross per 24 hour  Intake    480 ml  Output   1050 ml  Net   -570 ml    Exam:   General: Patient is sitting up in chair . She did does not seem to be in any acute distress.   Cardiovascular: Regular rate rhythm  Respiratory: Clear to auscultation bilaterally  Abdomen: Soft positive bowel   Extremities: 1+ edema Neuro:R UE 3/5 , rest unremarkable  Data Reviewed: Basic Metabolic Panel:  Lab 12/08/11 8295 12/07/11 1359 12/07/11 1005 12/07/11 0253  NA 136 141 -- 139  K 3.2* 3.1* -- --  CL 92* 95* -- 89*  CO2 36* >45* -- --  GLUCOSE 118* 144* -- 119*  BUN 14 14 -- 15  CREATININE 0.51 0.55 -- 0.90  CALCIUM 9.1 9.2 -- --  MG -- -- 1.6 --  PHOS -- -- -- --   CBC:  Lab 12/08/11 0600 12/07/11 0253 12/07/11 0231  WBC 11.3* -- 8.1  NEUTROABS -- -- 6.2  HGB 11.1* 12.9 11.4*  HCT 36.1 38.0 36.6  MCV 98.6 -- 97.3  PLT 190 -- 196   Cardiac Enzymes:  Lab 12/08/11 0600 12/07/11 2143 12/07/11 1358  CKTOTAL 34 41 53  CKMB 3.2 4.0 5.0*  CKMBINDEX -- -- --  TROPONINI 0.31* 0.50* 0.67*   CBG:  Lab 12/07/11 2118  GLUCAP 118*    Studies: Ct Abdomen Pelvis Wo Contrast  11/26/2011  *RADIOLOGY REPORT*  Clinical  Data:  History of lung cancer.  Abdominal pain and nausea.  CT CHEST, ABDOMEN AND PELVIS WITHOUT CONTRAST  Technique:  Multidetector CT imaging of the chest, abdomen and pelvis was performed following the standard protocol without IV contrast.  Comparison:  Prior CT scan 08/21/2011.  CT CHEST  Findings:  The chest wall is unremarkable and stable.  The right- sided Port-A-Cath is stable.  No breast masses, supraclavicular or axillary adenopathy.  The thyroid gland appears normal.  Dense vascular calcifications.  The bony thorax is intact.  Stable osteoporosis.  The heart is mildly enlarged but stable.  No pericardial effusion. Stable dense aortic and  coronary artery calcifications.  No mediastinal or hilar lymphadenopathy.  The esophagus is grossly normal.  Stable emphysematous changes in the lungs.  Stable radiation changes involving the paramediastinal lungs. There are new and enlarging bilateral pulmonary nodules consistent with metastatic pulmonary disease.  No pleural effusions or pulmonary edema.  IMPRESSION:  1. New and enlarging pulmonary nodules consistent with pulmonary metastatic disease. 2.  Stable radiation changes. 3.  Stable emphysematous changes.  CT ABDOMEN AND PELVIS  Findings:  The liver is grossly normal without contrast.  No obvious metastatic lesions.  No biliary dilatation.  The gallbladder is slightly contracted.  The pancreas is normal.  The spleen is normal in size.  No focal lesions.  Stable bilateral adrenal gland masses consistent with benign adenomas.  No renal lesions.  The stomach, duodenum, small bowel and colon are grossly normal. No mesenteric or retroperitoneal masses or adenopathy.  Stable small scattered lymph nodes.  The aorta demonstrates advanced atherosclerotic calcifications as to the branch vessels.  A remote chronic distal aortic dissection is noted.  There is a stable left adnexal cysts with benign CT imaging features.  No pelvic mass, adenopathy or free pelvic fluid collections.  No inguinal mass or hernia.  Examination of the bony structures demonstrates an enlarging lytic lesion involving the right iliac bone with cortical breakthrough. There is also a destructive lytic lesion involving the right femoral neck with associated soft tissue mass.  Subsequent placement of a femoral rod.  The lumbar vertebral bodies are intact.  IMPRESSION:  1.  Stable bilateral adrenal gland adenomas. 2.  Stable left adnexal cyst. 3.  Progressive lytic bone lesion involving the right ilium with associated destructive bony change. 4.  Interval placement of a femoral rod across the pathologic lesion near the lesser trochanter. 5.  No CT  findings for metastatic hepatic disease or abdominal/pelvic adenopathy.  Original Report Authenticated By: P. Loralie Champagne, M.D.   Dg Chest 1 View  12/07/2011  *RADIOLOGY REPORT*  Clinical Data: Fall.  History of lung cancer.  CHEST - 1 VIEW  Comparison: Chest 11/11/2011.  The CT chest 11/26/2011.  Findings: Cardiac enlargement.  Normal pulmonary vascularity. Emphysematous changes in the lungs with scattered fibrosis.  Focal scarring in the right upper lung may represent postradiation change.  Spiculated appearing nodular opacities in both hilar regions may be related to scarring but metastasis is not excluded. No focal airspace consolidation.  No blunting of costophrenic angles.  No pneumothorax.  Degenerative changes and scoliosis of the spine.  Calcification of the aorta.  Right power port type central venous catheter with tip over the mid SVC region.  IMPRESSION: Cardiac enlargement.  Emphysema and fibrosis in the lungs. Scarring bilaterally.  Spiculated appearing nodular opacities in both hilar regions.  No active infiltration.  Original Report Authenticated By: Marlon Pel, M.D.   Dg Chest 2  View  11/11/2011  *RADIOLOGY REPORT*  Clinical Data: Preop radiograph.  CHEST - 2 VIEW  Comparison: 08/21/2011  Findings: There is a right chest wall porta-catheter with tip in the cavoatrial junction.  Heart size is normal.  Chronic, bilateral upper lobe opacities perihilar opacities are identified and appear similar to previous exam.  No superimposed acute airspace consolidation identified.  No pleural effusion or edema.  There is a marked scoliosis deformity involving the thoracic and lumbar spine.  IMPRESSION:  1.  Stable upper lobe perihilar scarring 2.  No acute cardiopulmonary abnormalities  Original Report Authenticated By: Rosealee Albee, M.D.   Dg Hip Operative Right  11/13/2011  *RADIOLOGY REPORT*  Clinical Data: Hip lesion  DG OPERATIVE RIGHT HIP  Comparison: 11/01/2011  Findings: Three spot  images demonstrate a dynamic compression screw and intramedullary rod transfixed in the proximal right femur.  One distal interlocking screw.  Anatomic alignment.  No breakage or loosening of the hardware.  IMPRESSION: Internal fixation proximal right femur.  Original Report Authenticated By: Donavan Burnet, M.D.   Dg Femur Right  12/07/2011  *RADIOLOGY REPORT*  Clinical Data: Pain after fall.  RIGHT FEMUR - 2 VIEW  Comparison: 11/13/2011  Findings: Postoperative changes in the right hip with intramedullary rod and screw and compression screw fixation.  No evidence of acute fracture or displacement.  No focal bone lesion or bone destruction.  Bone cortex and trabecular architecture appear intact.  Vascular calcifications.  Degenerative changes in the knee.  No significant change in positioning of hardware since previous study.  IMPRESSION: Postoperative changes with internal fixation of the proximal right femur.  No acute bony abnormalities identified.  Original Report Authenticated By: Marlon Pel, M.D.   Ct Head Without Contrast  12/07/2011  *RADIOLOGY REPORT*  Clinical Data: Right arm weakness  CT HEAD WITHOUT CONTRAST  Technique:  Contiguous axial images were obtained from the base of the skull through the vertex without contrast.  Comparison: 08/05/2010  Findings: Stable mild brain atrophy most pronounced in the frontal lobes.  No acute intracranial hemorrhage, definite mass lesion, infarction, midline shift, herniation, or hydrocephalus.  Gray- white matter differentiation maintained.  Cisterns patent.  No cerebellar abnormality.  Visualized mastoids and sinuses clear.  IMPRESSION: Stable exam.  No acute intracranial process by noncontrast CT  Original Report Authenticated By: Judie Petit. Ruel Favors, M.D.   Ct Chest Wo Contrast  11/26/2011  *RADIOLOGY REPORT*  Clinical Data:  History of lung cancer.  Abdominal pain and nausea.  CT CHEST, ABDOMEN AND PELVIS WITHOUT CONTRAST  Technique:  Multidetector CT  imaging of the chest, abdomen and pelvis was performed following the standard protocol without IV contrast.  Comparison:  Prior CT scan 08/21/2011.  CT CHEST  Findings:  The chest wall is unremarkable and stable.  The right- sided Port-A-Cath is stable.  No breast masses, supraclavicular or axillary adenopathy.  The thyroid gland appears normal.  Dense vascular calcifications.  The bony thorax is intact.  Stable osteoporosis.  The heart is mildly enlarged but stable.  No pericardial effusion. Stable dense aortic and coronary artery calcifications.  No mediastinal or hilar lymphadenopathy.  The esophagus is grossly normal.  Stable emphysematous changes in the lungs.  Stable radiation changes involving the paramediastinal lungs. There are new and enlarging bilateral pulmonary nodules consistent with metastatic pulmonary disease.  No pleural effusions or pulmonary edema.  IMPRESSION:  1. New and enlarging pulmonary nodules consistent with pulmonary metastatic disease. 2.  Stable radiation changes. 3.  Stable emphysematous changes.  CT ABDOMEN AND PELVIS  Findings:  The liver is grossly normal without contrast.  No obvious metastatic lesions.  No biliary dilatation.  The gallbladder is slightly contracted.  The pancreas is normal.  The spleen is normal in size.  No focal lesions.  Stable bilateral adrenal gland masses consistent with benign adenomas.  No renal lesions.  The stomach, duodenum, small bowel and colon are grossly normal. No mesenteric or retroperitoneal masses or adenopathy.  Stable small scattered lymph nodes.  The aorta demonstrates advanced atherosclerotic calcifications as to the branch vessels.  A remote chronic distal aortic dissection is noted.  There is a stable left adnexal cysts with benign CT imaging features.  No pelvic mass, adenopathy or free pelvic fluid collections.  No inguinal mass or hernia.  Examination of the bony structures demonstrates an enlarging lytic lesion involving the right  iliac bone with cortical breakthrough. There is also a destructive lytic lesion involving the right femoral neck with associated soft tissue mass.  Subsequent placement of a femoral rod.  The lumbar vertebral bodies are intact.  IMPRESSION:  1.  Stable bilateral adrenal gland adenomas. 2.  Stable left adnexal cyst. 3.  Progressive lytic bone lesion involving the right ilium with associated destructive bony change. 4.  Interval placement of a femoral rod across the pathologic lesion near the lesser trochanter. 5.  No CT findings for metastatic hepatic disease or abdominal/pelvic adenopathy.  Original Report Authenticated By: P. Loralie Champagne, M.D.   Ct Angio Chest W/cm &/or Wo Cm  12/07/2011  **ADDENDUM** CREATED: 12/07/2011 05:40:23  The esophagus is mildly distended with an air-fluid level suggesting dysmotility.  **END ADDENDUM** SIGNED BY: Marlon Pel, M.D.   12/07/2011  *RADIOLOGY REPORT*  Clinical Data: Short of breath.  History of lung cancer and COPD. Fall.  CT ANGIOGRAPHY CHEST  Technique:  Multidetector CT imaging of the chest using the standard protocol during bolus administration of intravenous contrast. Multiplanar reconstructed images including MIPs were obtained and reviewed to evaluate the vascular anatomy.  Contrast: OMNIPAQUE IOHEXOL 350 MG/ML SOLN  Comparison: 11/26/2011  Findings: Technically adequate study with good opacification of the central and segmental pulmonary arteries.  There are focal intraluminal filling defects in the scattered bilateral segmental branches consistent with pulmonary emboli.  Cardiac enlargement.  Calcification of the aortic valve and coronary arteries.  Calcification and ectasia of the aorta. Scarring in the mid lungs bilaterally with mass or adenopathy in the right hilum measuring about 15 mm diameter.  Pretracheal lymphadenopathy measures 12 mm short axis dimension.  Multiple pulmonary nodules are again demonstrated bilaterally consistent with  metastatic disease.  Probably the largest is a spiculated nodule in the right upper lung measuring 8 mm diameter. Visualization of the lungs is limited due to respiratory motion artifact.  Diffuse emphysematous changes and fibrosis throughout the lungs.  Atelectasis in the lung bases.  Focal wedge-shaped consolidation in the right lung base posteriorly may represent a focal infarct.  Bronchiectasis and mucus plugging in the lung bases with peribronchial thickening consistent with chronic bronchitis. Bilateral adrenal gland nodules measuring 2.6 cm on the left and 3.3 cm on the right.  These demonstrate low density measurements suggesting adenomas.  Degenerative changes in the thoracic spine. No destructive bone lesions appreciated. Minimal right pleural effusion.  IMPRESSION: Positive study for multiple bilateral segmental pulmonary emboli. Possible small peripheral infarct in the right lung base posteriorly.  Again demonstrated are multiple diffuse bilateral pulmonary metastases, right hilar mass, diffuse  emphysematous changes, and fibrosis. Chronic bronchitic changes.  Critical Value/emergent results were called by telephone at the time of interpretation on 12/07/2011  at 0440 hours  to  Dr. Hyacinth Meeker, who verbally acknowledged these results. Cardiac enlargement.  Bilateral adrenal gland nodules.  Original Report Authenticated By: Marlon Pel, M.D.   Mr Laqueta Jean Wo Contrast  12/07/2011  *RADIOLOGY REPORT*  Clinical Data: 71 year old female with metastatic cancer.  Right upper extremity weakness.  MRI HEAD WITHOUT AND WITH CONTRAST  Technique:  Multiplanar, multiecho pulse sequences of the brain and surrounding structures were obtained according to standard protocol without and with intravenous contrast  Contrast: 10mL MULTIHANCE GADOBENATE DIMEGLUMINE 529 MG/ML IV SOLN  Comparison: Head CTs 12/07/2011 and earlier.  Findings: Multiple small foci of restricted diffusion scattered in the right hemisphere,  including the frontal lobe (left upper extremity motor cortex regions series 4 image 18), parietal lobe, and also a fairly dense involvement in the left occipital pole (series 4 image 7).  Additionally, there is a wedge-shaped infarct with restricted diffusion in the left cerebellum (image 4).  Also, there are two suspicious foci of possible diffusion restriction in the right hemisphere, pre motor cortex on image 20 and possible focal right occipital lobe cortex involvement on image 80.  None of these areas show evidence of hemorrhage or mass effect. Major intracranial vascular flow voids are preserved.  No ventriculomegaly.  No midline shift.  No abnormal enhancement or mass identified in the brain.  Chronic lacunar infarcts occasionally noted in the deep gray matter nuclei and cerebellum.  Overall normal cerebral volume. Negative pituitary and cervicomedullary junction.  Visualized bone marrow signal is within normal limits. Degenerative changes and cervical spine. Visualized orbit soft tissues are within normal limits.  Visualized paranasal sinuses and mastoids are clear.  Negative scalp soft tissues.  IMPRESSION: 1.  Multiple scattered small acute infarcts in the left hemisphere involving both the MCA and PCA territories.  Left upper extremity motor cortex region is affected. 2.  Small wedge-shaped acute infarct also in the left cerebellum. 3.  Suggestion of several punctate infarcts also in the right hemisphere.  This constellation raise the possibility of recent embolic event. 4.  No mass effect or hemorrhage.  Mild for age underlying chronic small vessel ischemia. 5.  No metastatic disease identified.  Original Report Authenticated By: Harley Hallmark, M.D.    Scheduled Meds:   . aspirin  325 mg Oral Daily  . bisoprolol  2.5 mg Oral Daily  . folic acid  1 mg Oral Daily  . heparin  1,500 Units Intravenous Once  . lisinopril  20 mg Oral Daily  . potassium chloride  40 mEq Oral Daily  . potassium  chloride  40 mEq Oral Once  . sodium chloride  10-40 mL Intracatheter Q12H  . tiotropium  18 mcg Inhalation Daily   Continuous Infusions:   . heparin    . DISCONTD: heparin 1,100 Units/hr (12/08/11 0432)

## 2011-12-11 NOTE — Progress Notes (Signed)
Recommendation is to continue lovenox due history of malignant neoplasm, followed by oncology, with metastases and now with pulmonary embolism as demonstrated on chest CT. No further neurologic intervention is recommended at this time.Stroke Team will sign off.  If further questions arise, please call or page at that time.  Thank you for allowing the Stroke Center to participate in the care of this patient.  Guy Franco, [facility],  MBA, [facility]  [phone] Dr. Delia Heady, MD    12/11/2011  10:28 AM

## 2011-12-11 NOTE — Progress Notes (Signed)
Physical Therapy Treatment Patient Details Name: [name] MRN: [id number] DOB: [birth year] Today's Date: 12/11/2011 Time: 0921-1008 PT Time Calculation (min): 47 min  PT Assessment / Plan / Recommendation Comments on Treatment Session  Pt with small CVA, R hip pain, bil PE and continues to progress with mobility with max encouragement. Nursing staff aware of mobility and pt encouraged to continue ambulating to bathroom rather than BSC.    Follow Up Recommendations       Barriers to Discharge        Equipment Recommendations       Recommendations for Other Services    Frequency     Plan Discharge plan remains appropriate;Frequency remains appropriate    Precautions / Restrictions Precautions Precautions: Fall Precaution Comments: oxygen   Pertinent Vitals/Pain 8/10 right hip pain, no grimace and RN medicated sats 95% on 3L at rest and HR 106 down to 85% on 3L with ambulation    Mobility  Bed Mobility Bed Mobility: Supine to Sit;Sitting - Scoot to Edge of Bed Supine to Sit: 4: Min assist;With rails;HOB elevated (HOB 20 degrees) Sitting - Scoot to Edge of Bed: 4: Min guard Details for Bed Mobility Assistance: Cueing for sequence, hand placement and increased time to complete pivoting legs to EOB then scooting anteriorly Transfers Transfers: Sit to Stand;Stand to Sit Sit to Stand: 4: Min assist;From bed;From toilet Stand to Sit: To toilet;To chair/3-in-1;With armrests Details for Transfer Assistance: Cueing for sequence, hand and RLE placement and assist for anterior translation Ambulation/Gait Ambulation/Gait Assistance: 4: Min guard Ambulation Distance (Feet): 90 Feet (15' then 90') Assistive device: Rolling walker Ambulation/Gait Assistance Details: sequential cueing and decreased speed Gait Pattern: Step-to pattern Stairs: No    Exercises     PT Diagnosis:    PT Problem List:   PT Treatment Interventions:     PT Goals Acute Rehab PT Goals PT Goal:  Supine/Side to Sit - Progress: Progressing toward goal PT Goal: Sit to Stand - Progress: Progressing toward goal PT Goal: Stand to Sit - Progress: Progressing toward goal PT Goal: Ambulate - Progress: Progressing toward goal  Visit Information  Last PT Received On: 12/11/11 Assistance Needed: +1    Subjective Data  Subjective: I can't get up   Cognition  Overall Cognitive Status: Appears within functional limits for tasks assessed/performed Arousal/Alertness: Awake/alert Orientation Level: Appears intact for tasks assessed Behavior During Session: [facility] for tasks performed Cognition - Other Comments: Pt continues to state she can't for everything and then performs it with encouragement and reassurance    Balance     End of Session PT - End of Session Equipment Utilized During Treatment: Gait belt Activity Tolerance: Patient tolerated treatment well Patient left: in chair;with call bell/phone within reach Nurse Communication: Mobility status    Delorse Lek 12/11/2011, 10:13 AM Delaney Meigs, PT [phone]

## 2011-12-11 NOTE — Progress Notes (Signed)
Spoke with patient and her caregiver, Marylu Lund, [phone], at bedside- they are wanting to consider home instead of SNF. Updated her on how the patient was doing with PT today and they would like to pursue this. I have shared SNF offers with them and they are ok with her coming home as long as she can assist with getting up/out of bed and can ambulate. Would like to consider a bed, 3 in 1, etc. Will discuss with RNCM and PT.  Reece Levy, MSW, [facility] [phone]

## 2011-12-12 ENCOUNTER — Ambulatory Visit: Payer: No Typology Code available for payment source

## 2011-12-12 DIAGNOSIS — G459 Transient cerebral ischemic attack, unspecified: Secondary | ICD-10-CM

## 2011-12-12 DIAGNOSIS — E876 Hypokalemia: Secondary | ICD-10-CM

## 2011-12-12 DIAGNOSIS — Z85118 Personal history of other malignant neoplasm of bronchus and lung: Secondary | ICD-10-CM

## 2011-12-12 DIAGNOSIS — I2699 Other pulmonary embolism without acute cor pulmonale: Secondary | ICD-10-CM

## 2011-12-12 LAB — CBC
HCT: 33.9 % — ABNORMAL LOW (ref 36.0–46.0)
Hemoglobin: 10.6 g/dL — ABNORMAL LOW (ref 12.0–15.0)
MCH: 31.2 pg (ref 26.0–34.0)
MCHC: 31.3 g/dL (ref 30.0–36.0)
MCV: 99.7 fL (ref 78.0–100.0)
RBC: 3.4 MIL/uL — ABNORMAL LOW (ref 3.87–5.11)

## 2011-12-12 MED ORDER — FUROSEMIDE 10 MG/ML IJ SOLN
20.0000 mg | Freq: Two times a day (BID) | INTRAMUSCULAR | Status: DC
  Administered 2011-12-12 – 2011-12-13 (×2): 20 mg via INTRAVENOUS
  Filled 2011-12-12 (×5): qty 2

## 2011-12-12 NOTE — Progress Notes (Signed)
Physical Therapy Treatment Patient Details Name: [name] MRN: [id number] DOB: [birth year] Today's Date: 12/12/2011 Time: 7829-5621 PT Time Calculation (min): 38 min  PT Assessment / Plan / Recommendation Comments on Treatment Session  Pt with small CVA, R hip pain, bil PE who continues to progress with mobility with decreased encouragement needed. Pt educated for need to increased mobility acutely to be able maximize function for home. Will continue to follow.    Follow Up Recommendations  Home health PT;Supervision for mobility/OOB    Barriers to Discharge        Equipment Recommendations       Recommendations for Other Services    Frequency     Plan Discharge plan needs to be updated;Frequency remains appropriate    Precautions / Restrictions Precautions Precautions: Fall   Pertinent Vitals/Pain No pain at rest    Mobility  Bed Mobility Bed Mobility: Not assessed Transfers Transfers: Sit to Stand;Stand to Sit Sit to Stand: 4: Min guard;From chair/3-in-1;From toilet Stand to Sit: To toilet;To chair/3-in-1;With armrests;4: Min guard Details for Transfer Assistance: cueing for sequence, hand and foot placement and safety Ambulation/Gait Ambulation/Gait Assistance: 5: Supervision Ambulation Distance (Feet): 140 Feet (140' then 60' after seated rest) Assistive device: Rolling walker Ambulation/Gait Assistance Details: cueing for sequence and posture with decreased speed and one standing rest halfway Gait Pattern: Step-to pattern Stairs: No    Exercises     PT Diagnosis:    PT Problem List:   PT Treatment Interventions:     PT Goals Acute Rehab PT Goals PT Goal: Sit to Stand - Progress: Progressing toward goal PT Goal: Stand to Sit - Progress: Progressing toward goal PT Goal: Ambulate - Progress: Progressing toward goal  Visit Information  Last PT Received On: 12/12/11 Assistance Needed: +1    Subjective Data  Subjective: Oh, I don't know how I'll do it  at home   Cognition  Overall Cognitive Status: Appears within functional limits for tasks assessed/performed Arousal/Alertness: Awake/alert Orientation Level: Appears intact for tasks assessed Behavior During Session: [facility] for tasks performed    Balance     End of Session PT - End of Session Equipment Utilized During Treatment: Gait belt Activity Tolerance: Patient tolerated treatment well Patient left: in chair;with call bell/phone within reach Nurse Communication: Mobility status    Delorse Lek 12/12/2011, 11:49 AM Delaney Meigs, PT [phone]

## 2011-12-12 NOTE — Progress Notes (Signed)
ANTICOAGULATION CONSULT NOTE - Follow Up Consult  Pharmacy Consult for Lovenox Indication: Acute PE, Acute CVA  Allergies  Allergen Reactions  . Other Hives    Cheap jewelry.    Patient Measurements: Height: 5' 4.17" (163 cm) Weight: 125 lb 14.1 oz (57.1 kg) IBW/kg (Calculated) : 55.1  Heparin Dosing Weight:   Vital Signs: Temp: 97.9 F (36.6 C) (06/07 0545) Temp src: Oral (06/07 0545) BP: 100/61 mmHg (06/07 0545) Pulse Rate: 87  (06/07 0545)  Labs:  Basename 12/12/11 0510 12/11/11 0536 12/10/11 0500  HGB 10.6* 11.0* --  HCT 33.9* 36.1 34.1*  PLT 244 241 189  APTT -- -- --  LABPROT -- -- --  INR -- -- --  HEPARINUNFRC -- -- --  CREATININE -- -- 0.46*  CKTOTAL -- -- --  CKMB -- -- --  TROPONINI -- -- --    Estimated Creatinine Clearance: 56.1 ml/min (by C-G formula based on Cr of 0.46).  Assessment: 71yof s/p acute CVA on Lovenox for acute PE in setting of metastatic cancer. Stroke team has evaluated the patient and recommended the continuation of Lovenox. Renal function has improved during admission (CrCl ~56 ml/min).  - Wt: 57.1kg - H/H and Plts stable - No significant bleeding reported  Goal of Therapy:  Anti-Xa level 0.6-1.2 units/ml 4hrs after LMWH dose given Monitor platelets by anticoagulation protocol: Yes   Plan:  1. Continue Lovenox 55mg  (~ 1mg /kg) SQ q12h 2. Monitor CBC q72h and s/s of bleeding  Cleon Dew [phone] 12/12/2011,10:52 AM

## 2011-12-12 NOTE — Progress Notes (Signed)
Pt says she's cut down from 1/2 ppd to 5 cigarettes per day and she'd like to continue cutting down on her own but she says she won't promise she'll be quit on any particular date. Congratulated pt on cutting down and encouraged her to continue with her cutting down plan. Referred to 1-800 quit now for f/u and support. Discussed oral fixation substitutes, second hand smoke and in home smoking policy. Reviewed and gave pt Written education/contact information.

## 2011-12-12 NOTE — Progress Notes (Signed)
[facility] PROGRESS NOTE  SARH KIRSCHENBAUM ZOX:[id number] DOB: [birth year] DOA: 12/07/2011 PCP: [name], MD, MD  Assessment/Plan: CVA (cerebral infarction)  Patient with acute CVA involving MCA and PCA territories on MRI. D/W Neurology , continue Lovenox, Aspirin Dced, given concurrent PE   Malignant neoplasm of bronchus and lung, unspecified site/Secondary malignant neoplasm of bone and bone marrow Dr.Davis D/w Dr. Shirline Frees, and Rad Onc, No intervention at this time.   will need to resume XRT to R hip after discharge Continue oxycontin   COPD (chronic obstructive pulmonary disease)  Continue oxygen and nebulizer scheduled and PRN.  Wean O2   Hypoxia: due to PE/Pulmonary infarct, COPD: CXR-unremarkable, Add Albuterol nebs, wean O2  Pathological fracture of hip in neoplastic disease/R hip/thigh pain Patient pathologic hip fracture on the right status post surgery per orthopedics.  Continue  OxyContin Needs to resume XRT to R hip Physical therapy evaluated patient and now recommending HHPT  Acute Pulmonary embolism Currently on Lovenox, ideal method of anticoagulation given metastatic CA Family education to administer this at home  Hypokalemia Resolved  Lower ext edema: 3rd spacing and possibly DVT/given concurrent PE Diurese with lasix  Code Status: DNR Family Communication: None Disposition Plan: HH PT tomorrow if stable [name], MD  [facility] Pager [phone]  If 8PM-8AM, please contact night-coverage [email or web address] Password [email or web address] 12/09/2011, 10:35 AM   LOS: 1 day   Brief narrative: None  Consultants:  Neurology: Dr. Roseanne Reno  Oncology: Dr. Jerolyn Center notified  Procedures:  See Below  Antibiotics:  None ?  HPI/Subjective: Continues to c/o R hip pain, breathing fair, ambulated in halls with PT   Objective: Filed Vitals:   12/08/11 0426 12/08/11 0432 12/08/11 0800 12/08/11 0823  BP: 123/77  139/88   Pulse: 83  94     Temp: 97.7 F (36.5 C)  98.4 F (36.9 C)   TempSrc: Oral  Oral   Resp: 18  18   Height:      Weight:      SpO2: 91% 91% 91% 89%    Intake/Output Summary (Last 24 hours) at 12/08/11 1035 Last data filed at 12/08/11 0432  Gross per 24 hour  Intake    480 ml  Output   1050 ml  Net   -570 ml    Exam:   General: Patient is sitting up in chair . She did does not seem to be in any acute distress.   Cardiovascular: Regular rate rhythm  Respiratory: Clear to auscultation bilaterally  Abdomen: Soft positive bowel   Extremities: 2+ edema Neuro:R UE 3/5 , rest unremarkable  Data Reviewed: Basic Metabolic Panel:  Lab 12/08/11 8295 12/07/11 1359 12/07/11 1005 12/07/11 0253  NA 136 141 -- 139  K 3.2* 3.1* -- --  CL 92* 95* -- 89*  CO2 36* >45* -- --  GLUCOSE 118* 144* -- 119*  BUN 14 14 -- 15  CREATININE 0.51 0.55 -- 0.90  CALCIUM 9.1 9.2 -- --  MG -- -- 1.6 --  PHOS -- -- -- --   CBC:  Lab 12/08/11 0600 12/07/11 0253 12/07/11 0231  WBC 11.3* -- 8.1  NEUTROABS -- -- 6.2  HGB 11.1* 12.9 11.4*  HCT 36.1 38.0 36.6  MCV 98.6 -- 97.3  PLT 190 -- 196   Cardiac Enzymes:  Lab 12/08/11 0600 12/07/11 2143 12/07/11 1358  CKTOTAL 34 41 53  CKMB 3.2 4.0 5.0*  CKMBINDEX -- -- --  TROPONINI 0.31* 0.50* 0.67*   CBG:  Lab 12/07/11 2118  GLUCAP 118*    Studies: Ct Abdomen Pelvis Wo Contrast  11/26/2011  *RADIOLOGY REPORT*  Clinical Data:  History of lung cancer.  Abdominal pain and nausea.  CT CHEST, ABDOMEN AND PELVIS WITHOUT CONTRAST  Technique:  Multidetector CT imaging of the chest, abdomen and pelvis was performed following the standard protocol without IV contrast.  Comparison:  Prior CT scan 08/21/2011.  CT CHEST  Findings:  The chest wall is unremarkable and stable.  The right- sided Port-A-Cath is stable.  No breast masses, supraclavicular or axillary adenopathy.  The thyroid gland appears normal.  Dense vascular calcifications.  The bony thorax is intact.  Stable  osteoporosis.  The heart is mildly enlarged but stable.  No pericardial effusion. Stable dense aortic and coronary artery calcifications.  No mediastinal or hilar lymphadenopathy.  The esophagus is grossly normal.  Stable emphysematous changes in the lungs.  Stable radiation changes involving the paramediastinal lungs. There are new and enlarging bilateral pulmonary nodules consistent with metastatic pulmonary disease.  No pleural effusions or pulmonary edema.  IMPRESSION:  1. New and enlarging pulmonary nodules consistent with pulmonary metastatic disease. 2.  Stable radiation changes. 3.  Stable emphysematous changes.  CT ABDOMEN AND PELVIS  Findings:  The liver is grossly normal without contrast.  No obvious metastatic lesions.  No biliary dilatation.  The gallbladder is slightly contracted.  The pancreas is normal.  The spleen is normal in size.  No focal lesions.  Stable bilateral adrenal gland masses consistent with benign adenomas.  No renal lesions.  The stomach, duodenum, small bowel and colon are grossly normal. No mesenteric or retroperitoneal masses or adenopathy.  Stable small scattered lymph nodes.  The aorta demonstrates advanced atherosclerotic calcifications as to the branch vessels.  A remote chronic distal aortic dissection is noted.  There is a stable left adnexal cysts with benign CT imaging features.  No pelvic mass, adenopathy or free pelvic fluid collections.  No inguinal mass or hernia.  Examination of the bony structures demonstrates an enlarging lytic lesion involving the right iliac bone with cortical breakthrough. There is also a destructive lytic lesion involving the right femoral neck with associated soft tissue mass.  Subsequent placement of a femoral rod.  The lumbar vertebral bodies are intact.  IMPRESSION:  1.  Stable bilateral adrenal gland adenomas. 2.  Stable left adnexal cyst. 3.  Progressive lytic bone lesion involving the right ilium with associated destructive bony change.  4.  Interval placement of a femoral rod across the pathologic lesion near the lesser trochanter. 5.  No CT findings for metastatic hepatic disease or abdominal/pelvic adenopathy.  Original Report Authenticated By: P. Loralie Champagne, M.D.   Dg Chest 1 View  12/07/2011  *RADIOLOGY REPORT*  Clinical Data: Fall.  History of lung cancer.  CHEST - 1 VIEW  Comparison: Chest 11/11/2011.  The CT chest 11/26/2011.  Findings: Cardiac enlargement.  Normal pulmonary vascularity. Emphysematous changes in the lungs with scattered fibrosis.  Focal scarring in the right upper lung may represent postradiation change.  Spiculated appearing nodular opacities in both hilar regions may be related to scarring but metastasis is not excluded. No focal airspace consolidation.  No blunting of costophrenic angles.  No pneumothorax.  Degenerative changes and scoliosis of the spine.  Calcification of the aorta.  Right power port type central venous catheter with tip over the mid SVC region.  IMPRESSION: Cardiac enlargement.  Emphysema and fibrosis in the lungs. Scarring bilaterally.  Spiculated appearing nodular opacities in both hilar regions.  No active infiltration.  Original Report Authenticated By: Marlon Pel, M.D.   Dg Chest 2 View  11/11/2011  *RADIOLOGY REPORT*  Clinical Data: Preop radiograph.  CHEST - 2 VIEW  Comparison: 08/21/2011  Findings: There is a right chest wall porta-catheter with tip in the cavoatrial junction.  Heart size is normal.  Chronic, bilateral upper lobe opacities perihilar opacities are identified and appear similar to previous exam.  No superimposed acute airspace consolidation identified.  No pleural effusion or edema.  There is a marked scoliosis deformity involving the thoracic and lumbar spine.  IMPRESSION:  1.  Stable upper lobe perihilar scarring 2.  No acute cardiopulmonary abnormalities  Original Report Authenticated By: Rosealee Albee, M.D.   Dg Hip Operative Right  11/13/2011  *RADIOLOGY  REPORT*  Clinical Data: Hip lesion  DG OPERATIVE RIGHT HIP  Comparison: 11/01/2011  Findings: Three spot images demonstrate a dynamic compression screw and intramedullary rod transfixed in the proximal right femur.  One distal interlocking screw.  Anatomic alignment.  No breakage or loosening of the hardware.  IMPRESSION: Internal fixation proximal right femur.  Original Report Authenticated By: Donavan Burnet, M.D.   Dg Femur Right  12/07/2011  *RADIOLOGY REPORT*  Clinical Data: Pain after fall.  RIGHT FEMUR - 2 VIEW  Comparison: 11/13/2011  Findings: Postoperative changes in the right hip with intramedullary rod and screw and compression screw fixation.  No evidence of acute fracture or displacement.  No focal bone lesion or bone destruction.  Bone cortex and trabecular architecture appear intact.  Vascular calcifications.  Degenerative changes in the knee.  No significant change in positioning of hardware since previous study.  IMPRESSION: Postoperative changes with internal fixation of the proximal right femur.  No acute bony abnormalities identified.  Original Report Authenticated By: Marlon Pel, M.D.   Ct Head Without Contrast  12/07/2011  *RADIOLOGY REPORT*  Clinical Data: Right arm weakness  CT HEAD WITHOUT CONTRAST  Technique:  Contiguous axial images were obtained from the base of the skull through the vertex without contrast.  Comparison: 08/05/2010  Findings: Stable mild brain atrophy most pronounced in the frontal lobes.  No acute intracranial hemorrhage, definite mass lesion, infarction, midline shift, herniation, or hydrocephalus.  Gray- white matter differentiation maintained.  Cisterns patent.  No cerebellar abnormality.  Visualized mastoids and sinuses clear.  IMPRESSION: Stable exam.  No acute intracranial process by noncontrast CT  Original Report Authenticated By: Judie Petit. Ruel Favors, M.D.   Ct Chest Wo Contrast  11/26/2011  *RADIOLOGY REPORT*  Clinical Data:  History of lung cancer.   Abdominal pain and nausea.  CT CHEST, ABDOMEN AND PELVIS WITHOUT CONTRAST  Technique:  Multidetector CT imaging of the chest, abdomen and pelvis was performed following the standard protocol without IV contrast.  Comparison:  Prior CT scan 08/21/2011.  CT CHEST  Findings:  The chest wall is unremarkable and stable.  The right- sided Port-A-Cath is stable.  No breast masses, supraclavicular or axillary adenopathy.  The thyroid gland appears normal.  Dense vascular calcifications.  The bony thorax is intact.  Stable osteoporosis.  The heart is mildly enlarged but stable.  No pericardial effusion. Stable dense aortic and coronary artery calcifications.  No mediastinal or hilar lymphadenopathy.  The esophagus is grossly normal.  Stable emphysematous changes in the lungs.  Stable radiation changes involving the paramediastinal lungs. There are new and enlarging bilateral pulmonary nodules consistent with metastatic pulmonary disease.  No pleural effusions or pulmonary edema.  IMPRESSION:  1.  New and enlarging pulmonary nodules consistent with pulmonary metastatic disease. 2.  Stable radiation changes. 3.  Stable emphysematous changes.  CT ABDOMEN AND PELVIS  Findings:  The liver is grossly normal without contrast.  No obvious metastatic lesions.  No biliary dilatation.  The gallbladder is slightly contracted.  The pancreas is normal.  The spleen is normal in size.  No focal lesions.  Stable bilateral adrenal gland masses consistent with benign adenomas.  No renal lesions.  The stomach, duodenum, small bowel and colon are grossly normal. No mesenteric or retroperitoneal masses or adenopathy.  Stable small scattered lymph nodes.  The aorta demonstrates advanced atherosclerotic calcifications as to the branch vessels.  A remote chronic distal aortic dissection is noted.  There is a stable left adnexal cysts with benign CT imaging features.  No pelvic mass, adenopathy or free pelvic fluid collections.  No inguinal mass or  hernia.  Examination of the bony structures demonstrates an enlarging lytic lesion involving the right iliac bone with cortical breakthrough. There is also a destructive lytic lesion involving the right femoral neck with associated soft tissue mass.  Subsequent placement of a femoral rod.  The lumbar vertebral bodies are intact.  IMPRESSION:  1.  Stable bilateral adrenal gland adenomas. 2.  Stable left adnexal cyst. 3.  Progressive lytic bone lesion involving the right ilium with associated destructive bony change. 4.  Interval placement of a femoral rod across the pathologic lesion near the lesser trochanter. 5.  No CT findings for metastatic hepatic disease or abdominal/pelvic adenopathy.  Original Report Authenticated By: P. Loralie Champagne, M.D.   Ct Angio Chest W/cm &/or Wo Cm  12/07/2011  **ADDENDUM** CREATED: 12/07/2011 05:40:23  The esophagus is mildly distended with an air-fluid level suggesting dysmotility.  **END ADDENDUM** SIGNED BY: Marlon Pel, M.D.   12/07/2011  *RADIOLOGY REPORT*  Clinical Data: Short of breath.  History of lung cancer and COPD. Fall.  CT ANGIOGRAPHY CHEST  Technique:  Multidetector CT imaging of the chest using the standard protocol during bolus administration of intravenous contrast. Multiplanar reconstructed images including MIPs were obtained and reviewed to evaluate the vascular anatomy.  Contrast: OMNIPAQUE IOHEXOL 350 MG/ML SOLN  Comparison: 11/26/2011  Findings: Technically adequate study with good opacification of the central and segmental pulmonary arteries.  There are focal intraluminal filling defects in the scattered bilateral segmental branches consistent with pulmonary emboli.  Cardiac enlargement.  Calcification of the aortic valve and coronary arteries.  Calcification and ectasia of the aorta. Scarring in the mid lungs bilaterally with mass or adenopathy in the right hilum measuring about 15 mm diameter.  Pretracheal lymphadenopathy measures 12 mm short  axis dimension.  Multiple pulmonary nodules are again demonstrated bilaterally consistent with metastatic disease.  Probably the largest is a spiculated nodule in the right upper lung measuring 8 mm diameter. Visualization of the lungs is limited due to respiratory motion artifact.  Diffuse emphysematous changes and fibrosis throughout the lungs.  Atelectasis in the lung bases.  Focal wedge-shaped consolidation in the right lung base posteriorly may represent a focal infarct.  Bronchiectasis and mucus plugging in the lung bases with peribronchial thickening consistent with chronic bronchitis. Bilateral adrenal gland nodules measuring 2.6 cm on the left and 3.3 cm on the right.  These demonstrate low density measurements suggesting adenomas.  Degenerative changes in the thoracic spine. No destructive bone lesions appreciated. Minimal right pleural effusion.  IMPRESSION: Positive study for multiple bilateral segmental pulmonary emboli. Possible small peripheral infarct in the  right lung base posteriorly.  Again demonstrated are multiple diffuse bilateral pulmonary metastases, right hilar mass, diffuse emphysematous changes, and fibrosis. Chronic bronchitic changes.  Critical Value/emergent results were called by telephone at the time of interpretation on 12/07/2011  at 0440 hours  to  Dr. Hyacinth Meeker, who verbally acknowledged these results. Cardiac enlargement.  Bilateral adrenal gland nodules.  Original Report Authenticated By: Marlon Pel, M.D.   Mr Laqueta Jean Wo Contrast  12/07/2011  *RADIOLOGY REPORT*  Clinical Data: 71 year old female with metastatic cancer.  Right upper extremity weakness.  MRI HEAD WITHOUT AND WITH CONTRAST  Technique:  Multiplanar, multiecho pulse sequences of the brain and surrounding structures were obtained according to standard protocol without and with intravenous contrast  Contrast: 10mL MULTIHANCE GADOBENATE DIMEGLUMINE 529 MG/ML IV SOLN  Comparison: Head CTs 12/07/2011 and earlier.   Findings: Multiple small foci of restricted diffusion scattered in the right hemisphere, including the frontal lobe (left upper extremity motor cortex regions series 4 image 18), parietal lobe, and also a fairly dense involvement in the left occipital pole (series 4 image 7).  Additionally, there is a wedge-shaped infarct with restricted diffusion in the left cerebellum (image 4).  Also, there are two suspicious foci of possible diffusion restriction in the right hemisphere, pre motor cortex on image 20 and possible focal right occipital lobe cortex involvement on image 80.  None of these areas show evidence of hemorrhage or mass effect. Major intracranial vascular flow voids are preserved.  No ventriculomegaly.  No midline shift.  No abnormal enhancement or mass identified in the brain.  Chronic lacunar infarcts occasionally noted in the deep gray matter nuclei and cerebellum.  Overall normal cerebral volume. Negative pituitary and cervicomedullary junction.  Visualized bone marrow signal is within normal limits. Degenerative changes and cervical spine. Visualized orbit soft tissues are within normal limits.  Visualized paranasal sinuses and mastoids are clear.  Negative scalp soft tissues.  IMPRESSION: 1.  Multiple scattered small acute infarcts in the left hemisphere involving both the MCA and PCA territories.  Left upper extremity motor cortex region is affected. 2.  Small wedge-shaped acute infarct also in the left cerebellum. 3.  Suggestion of several punctate infarcts also in the right hemisphere.  This constellation raise the possibility of recent embolic event. 4.  No mass effect or hemorrhage.  Mild for age underlying chronic small vessel ischemia. 5.  No metastatic disease identified.  Original Report Authenticated By: Harley Hallmark, M.D.    Scheduled Meds:   . aspirin  325 mg Oral Daily  . bisoprolol  2.5 mg Oral Daily  . folic acid  1 mg Oral Daily  . heparin  1,500 Units Intravenous Once  .  lisinopril  20 mg Oral Daily  . potassium chloride  40 mEq Oral Daily  . potassium chloride  40 mEq Oral Once  . sodium chloride  10-40 mL Intracatheter Q12H  . tiotropium  18 mcg Inhalation Daily   Continuous Infusions:   . heparin    . DISCONTD: heparin 1,100 Units/hr (12/08/11 0432)

## 2011-12-12 NOTE — Consult Note (Deleted)
Pt says she smokes 5 cigarettes per day and she is planning to remain quit. Encouraged pt tor remain tobacco free.   Referred to 1-800 quit now for f/u and support. Discussed oral fixation substitutes, second hand smoke and in home smoking policy. Reviewed and gave pt Written education/contact information.

## 2011-12-13 DIAGNOSIS — G459 Transient cerebral ischemic attack, unspecified: Secondary | ICD-10-CM

## 2011-12-13 DIAGNOSIS — I2699 Other pulmonary embolism without acute cor pulmonale: Secondary | ICD-10-CM

## 2011-12-13 DIAGNOSIS — E876 Hypokalemia: Secondary | ICD-10-CM

## 2011-12-13 DIAGNOSIS — Z85118 Personal history of other malignant neoplasm of bronchus and lung: Secondary | ICD-10-CM

## 2011-12-13 LAB — BASIC METABOLIC PANEL
CO2: 35 mEq/L — ABNORMAL HIGH (ref 19–32)
Calcium: 9.2 mg/dL (ref 8.4–10.5)
Creatinine, Ser: 0.39 mg/dL — ABNORMAL LOW (ref 0.50–1.10)
GFR calc non Af Amer: >90 mL/min (ref >90)
Sodium: 137 mEq/L (ref 135–145)

## 2011-12-13 LAB — CBC
MCH: 30.7 pg (ref 26.0–34.0)
MCHC: 31.3 g/dL (ref 30.0–36.0)
MCV: 98 fL (ref 78.0–100.0)
Platelets: 248 10*3/uL (ref 150–400)
RBC: 3.58 MIL/uL — ABNORMAL LOW (ref 3.87–5.11)

## 2011-12-13 MED ORDER — FUROSEMIDE 20 MG PO TABS: 20.0000 mg | ORAL_TABLET | Freq: Two times a day (BID) | ORAL | Status: AC

## 2011-12-13 MED ORDER — ENOXAPARIN SODIUM 60 MG/0.6ML ~~LOC~~ SOLN: 55.0000 mg | Freq: Two times a day (BID) | SUBCUTANEOUS | Status: DC

## 2011-12-13 MED ORDER — OXYCODONE HCL 15 MG PO TB12: 15.0000 mg | ORAL_TABLET | Freq: Two times a day (BID) | ORAL | Status: DC

## 2011-12-13 MED ORDER — ENOXAPARIN (LOVENOX) PATIENT EDUCATION KIT
PACK | Freq: Once | Status: DC
  Filled 2011-12-13: qty 1

## 2011-12-13 MED ORDER — ALBUTEROL SULFATE (5 MG/ML) 0.5% IN NEBU: 2.5000 mg | INHALATION_SOLUTION | RESPIRATORY_TRACT | Status: DC | PRN

## 2011-12-13 MED ORDER — HEPARIN SOD (PORK) LOCK FLUSH 100 UNIT/ML IV SOLN
500.0000 [IU] | INTRAVENOUS | Status: AC | PRN
  Administered 2011-12-13: 500 [IU]

## 2011-12-13 NOTE — Progress Notes (Signed)
Pt's niece states that PCP is no longer James Little. Primary doctor is Burnell Blanks,  At [facility].  Pt. Advised to see PCP in 1 week per discharge instructions. Caryl Fate, Chrystine Oiler

## 2011-12-13 NOTE — Discharge Summary (Signed)
Physician Discharge Summary  Patient ID: [name] MRN: [id number] DOB/AGE: [birth year] 71 y.o.  Admit date: 12/07/2011 Discharge date: 12/13/2011  Primary Care Physician:  [name], MD, MD  Oncologist: Dr.[name] Radiation Oncologist: Dr.Kinard  Discharge Diagnoses:   1. Acute Bilateral Pulmonary emboli 2. 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 Metastatic non small cell lung cancer 4. Prophylactic IM Nail of R femur for impending pathological fracture/ femur metastasis 5. COPD (chronic obstructive pulmonary disease) 6. HTN 7. Lower extremity edema 8. Protein calorie malnutrition 9. ANemia of chronic disease 10. HTN  Medication List  As of 12/13/2011  1:56 PM   STOP taking these medications         naproxen sodium 220 MG tablet         TAKE these medications         bisoprolol 5 MG tablet   Commonly known as: ZEBETA   Take 2.5 mg by mouth daily.      dexamethasone 4 MG tablet   Commonly known as: DECADRON   Take 4 mg by mouth as directed. Take 1 tablet twice daily the day before,the day of and the day after chemo      enoxaparin 60 MG/0.6ML injection   Commonly known as: LOVENOX   Inject 0.55 mLs (55 mg total) into the skin every 12 (twelve) hours.      folic acid 1 MG tablet   Commonly known as: FOLVITE   Take 1 mg by mouth daily.      furosemide 20 MG tablet   Commonly known as: LASIX   Take 1 tablet (20 mg total) by mouth 2 (two) times daily. For edema      lisinopril 20 MG tablet   Commonly known as: PRINIVIL,ZESTRIL   Take 20 mg by mouth daily.      oxyCODONE 15 MG Tb12   Commonly known as: OXYCONTIN   Take 1 tablet (15 mg total) by mouth every 12 (twelve) hours.      oxyCODONE-acetaminophen 5-325 MG per tablet   Commonly known as: PERCOCET   Take 1 tablet by mouth every 4 (four) hours as needed. take 1-2 every 4-6hrs      tiotropium 18 MCG inhalation capsule   Commonly known as: SPIRIVA   Place 18 mcg into inhaler and inhale daily.             Disposition and Follow-up:  PCP/Dr. Little in 1 week Dr.[name] in 2 weeks  Consults: Dr.Sethi/[facility]   DIAGNOSTICS/IMAGING   Radiology Results (last 7 days)                            Narrative:    *CHEST - 2 VIEW IMPRESSION: 1. Linear densities in the perihilar regions with confluence of the hilum, as on 12/07/2011. Scattered pulmonary nodules are better seen on 12/07/2011. 2. Minimal bibasilar atelectasis and tiny bilateral pleural effusions.  Original Report Authenticated By: Reyes Ivan, M.D.      MR Brain W Wo Contrast [[id number]]       Order Status: Completed   Resulted: 12/07/11 0953    Narrative:    IMPRESSION: 1. Multiple scattered small acute infarcts in the left hemisphere involving both the MCA and PCA territories. Left upper extremity motor cortex region is affected. 2. Small wedge-shaped acute infarct also in the left cerebellum. 3. Suggestion of several punctate infarcts also in the right hemisphere. This constellation raise the possibility of  recent embolic event. 4. No mass effect or hemorrhage. Mild for age underlying chronic small vessel ischemia. 5. No metastatic disease identified.  Original Report Authenticated By: Harley Hallmark, M.D.    CT head without contrast [[id number]]       Order Status: Completed   Resulted: 12/07/11 0732    Narrative:    *RADIOLOGY REPORT*  Clinical Data: Right arm weakness  CT HEAD WITHOUT CONTRAST IMPRESSION: Stable exam. No acute intracranial process by noncontrast CT  .    CT Angio Chest W/Cm &/Or Wo Cm [[id number]]       Order Status: Completed   Resulted: 12/07/11 0445    Addenda:           Signed: 12/07/11 0542 by Dierdre Forth, MD    Narrative:    CT ANGIOGRAPHY CHEST IMPRESSION: Positive study for multiple bilateral segmental pulmonary emboli. Possible small peripheral infarct in the right lung base posteriorly. Again demonstrated are multiple diffuse  bilateral pulmonary metastases, right hilar mass, diffuse emphysematous changes, and fibrosis. Chronic bronchitic changes.      DG Femur Right [[id number]]       Order Status: Completed   Resulted: 12/07/11 0231    Narrative:    *RADIOLOGY REPORT*  Clinical Data: Pain after fall.  RIGHT FEMUR - 2 VIEW   IMPRESSION: Postoperative changes with internal fixation of the proximal right femur. No acute bony abnormalities identified.  Original Report Authenticated By: Marlon Pel, M.D.    DG Chest 1 View [[id number]]       Order Status: Completed   Resulted: 12/07/11 0230    Narrative:    *RADIOLOGY REPORT*  Clinical Data: Fall. History of lung cancer.  CHEST - 1 VIEW IMPRESSION: Cardiac enlargement. Emphysema and fibrosis in the lungs. Scarring bilaterally. Spiculated appearing nodular opacities in both hilar regions. No active infiltration.          Brief H and P: [name] is an 71 y.o. female with history of metastatic lung cancer, history of breast cancer, status post left hip fracture, recent right hip surgery for pathological disease from bone metastases 3 weeks ago, history of prior chemotherapy and current radiation therapy, hypertension, status post right upper chest Port-A-Cath placement, present to the emergency room mainly because of persistent pain in her right hip. Evaluation in emergency room included a CT pulmonary angiogram because of her tachycardia and hypoxemia, which showed bilateral pulmonary embolism, with possible pulmonary infarct along with pulmonary mass and adenopathy. She also found to have severe hypokalemia and with potassium of 2.6, and normal creatinine along with normal hemoglobin. Review of her systems is also significant for acute right upper extremity weakness that was rather pronounced this morning. This was an acute change for her. In the emergency room there was residual weakness but she had improvement where she could lift her right  arm where she was not able to do so earlier today. There's been no slurred speech, facial droop or lower extremity weakness. She denied any palpitation or chest pain, but admitted to having her baseline shortness of breath. Intravenous heparin was ordered, not yet given, and hospitalist was asked to admit patient for pulmonary embolism in the setting of possible neurological event, such as TIA/CVA or even cerebral metastases. She was also noted to have elevated troponin to 0.54 with EKG showed no acute ST-T changes.    Hospital Course:  CVA (cerebral infarction)  Patient with acute CVA involving MCA and PCA territories on MRI.  D/W  Neurology , continue Lovenox, Aspirin Dced, given concurrent PE  Main symptom is R sided weakness predominantly of RUE, which has improved some  Acute Pulmonary embolism  Currently on Lovenox, ideal method of anticoagulation given metastatic CA  Has remained stable from a pulmonary standpoint Family education to administer this at home  Non small cell lung CA/ w/ R hip and femur metastasis D/w Dr. Shirline Frees, No intervention at this time.  will need to resume XRT to R hip after discharge  Continue oxycontin   COPD (chronic obstructive pulmonary disease)  Continue oxygen and nebulizer scheduled and PRN.   Hypokalemia  Resolved   Lower ext edema: 3rd spacing and possibly DVT/given concurrent PE  Diurese with lasix   Time spent on Discharge:  Signed: Nathaneil Feagans [facility]  12/13/2011, 1:56 PM

## 2011-12-13 NOTE — Progress Notes (Signed)
Spoke with pts niece, Marylu Lund, who will be pt's caregiver after discharge. Asked Marylu Lund to come in for Lovenox teaching. She stated that she would come to the hospital between 1 and 2pm today. Lovenox teaching kit ordered and placed in pt's room for niece. Instructions given to watch video #108 for Lovenox patient information. Patient has order for HHPT, RN, 3N1 that have been set up with [facility]. Informed CM that Home O2 needs to be arranged and that Lovenox availability at the patient's pharmacy needs to be confirmed. Harlow Asa

## 2011-12-13 NOTE — Progress Notes (Signed)
RT completed a follow up assessment of patient.  Patient has a history of Lung CA with mets.  Patient admitted to the hospital for hip pain.  Has not been in any distress over the last few days per patient.  RT discussed patient's Home regimen.  Patient takes Spiriva daily and Albuterol nebulizer treatments as needed at home.  Patient lung sounds are diminished.  No wheezing heard.  Discussed PRN regimen with patient and she stated that she was ok with this.  RT changing treatments to PRN although score is a 6 due to pulmonary history, atelectasis on xray per report, and oxygen requirement.  Patient feels that she is at her baseline with her breathing  at this time.  RT will continue to monitor.

## 2011-12-13 NOTE — Progress Notes (Signed)
Pt. o2 sats 88% without oygen while sitting in chair , dropped to 75% ambulating without 02, O2 applied at 2 L, sats up to 91%.  Ronelle Michie, Chrystine Oiler

## 2011-12-13 NOTE — Discharge Instructions (Signed)
STROKE/TIA DISCHARGE INSTRUCTIONS SMOKING Cigarette smoking nearly doubles your risk of having a stroke & is the single most alterable risk factor  If you smoke or have smoked in the last 12 months, you are advised to quit smoking for your health.  Most of the excess cardiovascular risk related to smoking disappears within a year of stopping.  Ask you doctor about anti-smoking medications  [place] Quit Line: 1-800-QUIT NOW  Free Smoking Cessation Classes ([phone]  CHOLESTEROL Know your levels; limit fat & cholesterol in your diet  Lipid Panel     Component Value Date/Time   CHOL 131 12/08/2011 0600   TRIG 97 12/08/2011 0600   HDL 40 12/08/2011 0600   CHOLHDL 3.3 12/08/2011 0600   VLDL 19 12/08/2011 0600   LDLCALC 72 12/08/2011 0600      Many patients benefit from treatment even if their cholesterol is at goal.  Goal: Total Cholesterol (CHOL) less than 160  Goal:  Triglycerides (TRIG) less than 150  Goal:  HDL greater than 40  Goal:  LDL (LDLCALC) less than 100   BLOOD PRESSURE American Stroke Association blood pressure target is less that 120/80 mm/Hg  Your discharge blood pressure is:  BP: 130/74 mmHg  Monitor your blood pressure  Limit your salt and alcohol intake  Many individuals will require more than one medication for high blood pressure  DIABETES (A1c is a blood sugar average for last 3 months) Goal HGBA1c is under 7% (HBGA1c is blood sugar average for last 3 months)  Diabetes: {STROKE DC DIABETES:22357}    Lab Results  Component Value Date   HGBA1C 5.6 12/07/2011     Your HGBA1c can be lowered with medications, healthy diet, and exercise.  Check your blood sugar as directed by your physician  Call your physician if you experience unexplained or low blood sugars.  PHYSICAL ACTIVITY/REHABILITATION Goal is 30 minutes at least 4 days per week    {STROKE DC ACTIVITY/REHAB:22359}  Activity decreases your risk of heart attack and stroke and makes your heart stronger.  It  helps control your weight and blood pressure; helps you relax and can improve your mood.  Participate in a regular exercise program.  Talk with your doctor about the best form of exercise for you (dancing, walking, swimming, cycling).  DIET/WEIGHT Goal is to maintain a healthy weight  Your discharge diet is: Cardiac *** liquids Your height is:  Height: 5' 4.17" (163 cm) Your current weight is: Weight: 57.1 kg (125 lb 14.1 oz) Your Body Mass Index (BMI) is:  BMI (Calculated): 21.5   Following the type of diet specifically designed for you will help prevent another stroke.  Your goal weight range is:  ***  Your goal Body Mass Index (BMI) is 19-24.  Healthy food habits can help reduce 3 risk factors for stroke:  High cholesterol, hypertension, and excess weight.  RESOURCES Stroke/Support Group:  Call [phone]  they meet the 3rd Sunday of the month on the Rehab Unit at [facility], [place] ( no meetings June, July & Aug).  STROKE EDUCATION PROVIDED/REVIEWED AND GIVEN TO PATIENT Stroke warning signs and symptoms How to activate emergency medical system (call 911). Medications prescribed at discharge. Need for follow-up after discharge. Personal risk factors for stroke. Pneumonia vaccine given:   {STROKE DC YES/NO/DATE:22363} Flu vaccine given:   {STROKE DC YES/NO/DATE:22363} My questions have been answered, the writing is legible, and I understand these instructions.  I will adhere to these goals & educational materials that have  been provided to me after my discharge from the hospital.

## 2011-12-13 NOTE — Progress Notes (Addendum)
   CARE MANAGEMENT NOTE 12/13/2011  Patient:  [name], [name]   Account Number:  [id number]  Date Initiated:  12/08/2011  Documentation initiated by:  SIMMONS,CRYSTAL  Subjective/Objective Assessment:   ADMITTED WTH BILATERAL PE'S/ CVA; PT HAS LUNG CA WITH METS; LIVES AT HOME WITH SISTER, NEICE AND NEPHEW; NEEDS ASSISTANCE WITH ADL'S.     Action/Plan:   DISCHARGE PLANNING DISCUSSED WITH PT AT BEDSIDE; SHE STATED THAT SHE NEEDED MORE HELP AT HOME BUT THERE'S NO ONE THAT IS ABLE TO CARE FOR HER B/C THEIR OWN HEALTH PROBLEMS. AGREEABLE TO SNF IF NECESSARY.   Anticipated DC Date:  12/12/2011   Anticipated DC Plan:  HOME W HOME HEALTH SERVICES  In-house referral  Clinical Social Worker      DC Planning Services  CM consult      PAC Choice  DURABLE MEDICAL EQUIPMENT  HOME HEALTH   Choice offered to / List presented to:  C-1 Patient   DME arranged  3-N-1  OXYGEN      DME agency  [facility]     HH arranged  HH-1 RN  HH-2 PT      [facility] agency  [facility]   Status of service:  Completed, signed off Medicare Important Message given?   (If response is "NO", the following Medicare IM given date fields will be blank) Date Medicare IM given:   Date Additional Medicare IM given:    Discharge Disposition:  HOME W HOME HEALTH SERVICES  Per UR Regulation:  Reviewed for med. necessity/level of care/duration of stay  If discussed at Long Length of Stay Meetings, dates discussed:    Comments:  12/13/2011 1200 Contacted [facility] for home oxygen. Spoke to pt and made aware that portable tank will only last 3-4 hours and that she will need to contact [facility] when she arrives home to set up home oxygen. Placed contact info on d/c instruction for [facility]. [facility] [place] [facility] not [facility] in [facility]. Spoke to pt and she will call her niece, Junious Dresser. She uses [facility] in [place]. Explained to pt that [facility]. States she will make her  niece aware. Isidoro Donning RN CCM Case Mgmt phone [phone]  12/12/11  1056  CRYSTAL SIMMONS RN, BSN [phone] DISCUSSED DISCHARGE PLANNING WHILE AMBULATING WITH PHYSICAL THERAPIST; PREFERS [facility] FOR HHRN/ PT- REFERRAL PLACED TO [place] WITH [facility] FOR HH; REFERRAL PLACED TO [place] WITH [facility] FOR BSC; PT STATED THAT SHE "NEEDS" A W/C BUT PHYSICAL THERAPIST STATED THAT SHE DOES NOT NEED ONE; SOC DATE FOR HH: WITHIN 24-48HRS.   12/08/11  1115  CRYSTAL SIMMONS RN, BSN [phone] WILL AWAIT PT/OT CONSULTS FOR RECOMMENDATIONS TO ASSIST WITH DISCHARGE PLANNING NEEDS;  NCM WILL FOLLOW.

## 2011-12-13 NOTE — Progress Notes (Signed)
Pt. Discharged 12/13/2011  2:52 PM Lovenox teaching with niece done. Video watched by niece, verbalized understanding.  Discharge instructions reviewed with patient/family. Patient/family verbalized understanding. All Rx's given. Questions answered as needed. Pt. Discharged to home with family/self.  Ashelynn Marks, Chrystine Oiler

## 2011-12-15 ENCOUNTER — Ambulatory Visit: Payer: No Typology Code available for payment source

## 2011-12-15 NOTE — Progress Notes (Signed)
Writer will leave all information concerning Home Lovenox for Monday through Friday Case Manager so that she may coordinate efforts with MD to obtain either Lovenox injections for patient or explore other options. Harlow Asa  Please note that all entries re: Home Lovenox by this writer have been entered as late notes for events transpiring for "Note Time". Harlow Asa

## 2011-12-15 NOTE — Progress Notes (Signed)
After many telephone encounters between writer, MD, and [facility], a one-week supply of Lovenox was filled by [facility] from the Lovenox program. Valla Leaver, patient's niece and caregiver, to notify that writer had 14 injections ready for her to pick up. At this time, 14 Lovenox injections have been given to patient's niece. Plan is for MD to MD review 12/15/11 as there is no Insurance MD available to review with patient's discharging MD. Harlow Asa

## 2011-12-15 NOTE — Progress Notes (Addendum)
Patient's niece, Marylu Lund, called back after patient's discharge and reported that patient's copay for 30-day supply of Generic Lovenox was approximately $3000 because patient's insurance denied coverage. Marylu Lund reported that the insurance company was asking for an MD to MD review to justify indefinite Lovenox use (metastatic CA--Lovenox is the drug of choice over Coumadin). Marylu Lund also reported that she bought 3 Lovenox injections at the out-of-pocket cost at $124. Writer will discuss with discharging MD on 12/14/11. Harlow Asa   1930: Director of [facility] made aware of situation discussed above (in the instance he is required to assist with obtaining medication for this patient). MD agrees with plan, as well as getting weekend CM involved again (as she was prior to patient's discharge). Harlow Asa

## 2011-12-15 NOTE — Progress Notes (Deleted)
Writer will leave all information concerning Home Lovenox for Monday through Friday Case Manager so that she may coordinate efforts with MD to obtain either Lovenox injections for patient or explore other options. Harlow Asa

## 2011-12-16 ENCOUNTER — Telehealth: Payer: Self-pay | Admitting: Medical Oncology

## 2011-12-16 ENCOUNTER — Ambulatory Visit: Payer: No Typology Code available for payment source

## 2011-12-16 ENCOUNTER — Other Ambulatory Visit: Payer: Self-pay | Admitting: Medical Oncology

## 2011-12-16 ENCOUNTER — Telehealth: Payer: Self-pay | Admitting: Internal Medicine

## 2011-12-16 DIAGNOSIS — C3490 Malignant neoplasm of unspecified part of unspecified bronchus or lung: Secondary | ICD-10-CM

## 2011-12-16 NOTE — Telephone Encounter (Signed)
Marylu Lund call and said pt d/c from hospital 12/13/11 and she wants f/u with Dr Donnald Garre. Request sent

## 2011-12-16 NOTE — Telephone Encounter (Signed)
pt aware of 7/1 hosp f/u appt    aom

## 2011-12-17 ENCOUNTER — Ambulatory Visit: Payer: No Typology Code available for payment source

## 2011-12-18 ENCOUNTER — Ambulatory Visit: Payer: No Typology Code available for payment source

## 2011-12-19 ENCOUNTER — Ambulatory Visit: Payer: No Typology Code available for payment source

## 2011-12-22 ENCOUNTER — Telehealth: Payer: Self-pay | Admitting: Radiation Oncology

## 2011-12-22 ENCOUNTER — Inpatient Hospital Stay: Admission: RE | Admit: 2011-12-22 | Payer: Self-pay | Source: Ambulatory Visit

## 2011-12-22 ENCOUNTER — Ambulatory Visit: Payer: No Typology Code available for payment source

## 2011-12-22 ENCOUNTER — Encounter: Payer: Self-pay | Admitting: *Deleted

## 2011-12-22 NOTE — Progress Notes (Signed)
  [facility] Psychosocial Distress Screening Clinical Social Work  Clinical Social Work was referred by distress screening protocol.  The patient scored a 7 on the Psychosocial Distress Thermometer which indicates moderate distress. Clinical Social Worker contacted the pt at home to assess for distress and other psychosocial needs.  Pt was unable to speak on the phone at time of contact; therefore CSW spoke with pt's niece.  Pt's niece stated she was doing "better", but still experiencing some pain and weakness.  CSW offered additional support to pt and family. , and informed pt's niece of [facility] support services.  CSW encouraged pt or family to call with any questions or concerns.    Clinical Social Worker follow up needed: not at this time  Tamala Julian, MSW, LCSW Clinical Social Worker [facility] [phone]

## 2011-12-22 NOTE — Telephone Encounter (Signed)
Phoned patient at home and cell number listed in demographics to check status. No answer at either. Left message requesting return call.

## 2011-12-22 NOTE — Telephone Encounter (Signed)
Called patient's home number and daughter, Marylu Lund, answered. Marylu Lund reports that despite her wanting her mother to resume radiation treatment the patient does not desire to at this time. Asked Marylu Lund if this Clinical research associate may phone back on Wednesday and she verbalized she hopes this Clinical research associate will. Routed this message to Dr. Roselind Messier. Will call patient again on Wednesday.

## 2011-12-23 ENCOUNTER — Ambulatory Visit: Payer: No Typology Code available for payment source

## 2011-12-24 ENCOUNTER — Ambulatory Visit: Payer: No Typology Code available for payment source

## 2011-12-24 ENCOUNTER — Telehealth: Payer: Self-pay | Admitting: Radiation Oncology

## 2011-12-24 NOTE — Telephone Encounter (Signed)
Phoned patient's home number listed in demographics. Spoke with patient's niece, Marylu Lund. Marylu Lund reports the patient would like to resume radiation treatment on Monday, June 24th. Informed therapist on linac 3 of this matter and they request she present on the 24th at 6pm for a 30 minute treatment. Phoned patient back at home number listed in demographics leaving appointment date and time with Katie. Encouraged family to contact this Clinical research associate with any future needs. All verbalized understanding. Routing this message to Dr. Roselind Messier.

## 2011-12-25 ENCOUNTER — Ambulatory Visit: Payer: No Typology Code available for payment source

## 2011-12-26 ENCOUNTER — Ambulatory Visit: Payer: No Typology Code available for payment source

## 2011-12-29 ENCOUNTER — Ambulatory Visit
Admission: RE | Admit: 2011-12-29 | Discharge: 2011-12-29 | Disposition: A | Discharge status: Home / Self Care | Payer: No Typology Code available for payment source | Source: Ambulatory Visit | Attending: Radiation Oncology | Admitting: Radiation Oncology

## 2011-12-29 DIAGNOSIS — C3490 Malignant neoplasm of unspecified part of unspecified bronchus or lung: Secondary | ICD-10-CM

## 2011-12-30 ENCOUNTER — Ambulatory Visit
Admission: RE | Admit: 2011-12-30 | Discharge: 2011-12-30 | Disposition: A | Discharge status: Home / Self Care | Payer: No Typology Code available for payment source | Source: Ambulatory Visit | Attending: Radiation Oncology | Admitting: Radiation Oncology

## 2011-12-30 DIAGNOSIS — C3490 Malignant neoplasm of unspecified part of unspecified bronchus or lung: Secondary | ICD-10-CM

## 2011-12-30 NOTE — Progress Notes (Signed)
Weekly Management Note Current Dose: 5  Gy  Projected Dose:30  Gy   Narrative:  The patient presents for routine under treatment assessment.  CBCT/MVCT images/Port film x-rays were reviewed.  The chart was checked. Doing well. Has been out due to stroke and PE. Pain continues but strength improving.  PCP will be handling pain meds.   Physical Findings: Alert, oriented.  Impression:  The patient is tolerating radiation.  Plan:  Continue treatment as planned. Discussed plan or care and time to pain relief.

## 2011-12-30 NOTE — Progress Notes (Signed)
HERE TODAY FOR PUT OF BONE, RIGHT FEMUR, RIGHT ILIAC.  C/O PAIN IN FEMUR , RATES AT 3/10 NOW BUT SAYS IT IS TIME FOR HER OXYCODONE.  POOR APPETITE.

## 2011-12-30 NOTE — Progress Notes (Signed)
  Radiation Oncology         [phone] ________________________________  Name: [name] MRN: [id number]  Date: 12/29/2011  DOB: 08-May-1941  Simulation Verification Note  Status: outpatient  NARRATIVE: The patient was brought to the treatment unit and placed in the planned treatment position. The clinical setup was verified. Then port films were obtained and uploaded to the radiation oncology medical record software.  The treatment beams were carefully compared against the planned radiation fields. The position location and shape of the radiation fields was reviewed. The targeted volume of tissue appears appropriately covered by the radiation beams. Organs at risk appear to be excluded as planned.  Based on my personal review, I approved the simulation verification. The patient's treatment will proceed as planned.  ------------------------------------------------  Lurline Hare, MD

## 2011-12-31 ENCOUNTER — Ambulatory Visit
Admission: RE | Admit: 2011-12-31 | Discharge: 2011-12-31 | Disposition: A | Discharge status: Home / Self Care | Payer: No Typology Code available for payment source | Source: Ambulatory Visit | Attending: Radiation Oncology | Admitting: Radiation Oncology

## 2012-01-01 ENCOUNTER — Ambulatory Visit
Admission: RE | Admit: 2012-01-01 | Discharge: 2012-01-01 | Disposition: A | Discharge status: Home / Self Care | Payer: No Typology Code available for payment source | Source: Ambulatory Visit | Attending: Radiation Oncology | Admitting: Radiation Oncology

## 2012-01-02 ENCOUNTER — Ambulatory Visit: Admission: RE | Admit: 2012-01-02 | Payer: No Typology Code available for payment source | Source: Ambulatory Visit

## 2012-01-05 ENCOUNTER — Encounter: Payer: Self-pay | Admitting: *Deleted

## 2012-01-05 ENCOUNTER — Telehealth: Payer: Self-pay | Admitting: Internal Medicine

## 2012-01-05 ENCOUNTER — Ambulatory Visit (HOSPITAL_BASED_OUTPATIENT_CLINIC_OR_DEPARTMENT_OTHER): Payer: No Typology Code available for payment source | Admitting: Internal Medicine

## 2012-01-05 ENCOUNTER — Ambulatory Visit
Admission: RE | Admit: 2012-01-05 | Discharge: 2012-01-05 | Disposition: A | Discharge status: Home / Self Care | Payer: No Typology Code available for payment source | Source: Ambulatory Visit | Attending: Radiation Oncology | Admitting: Radiation Oncology

## 2012-01-05 ENCOUNTER — Other Ambulatory Visit: Payer: No Typology Code available for payment source | Admitting: Lab

## 2012-01-05 VITALS — BP 141/80 | HR 52 | Temp 98.1°F | Ht 64.0 in | Wt 126.3 lb

## 2012-01-05 DIAGNOSIS — Z7901 Long term (current) use of anticoagulants: Secondary | ICD-10-CM

## 2012-01-05 DIAGNOSIS — I2699 Other pulmonary embolism without acute cor pulmonale: Secondary | ICD-10-CM

## 2012-01-05 DIAGNOSIS — C3410 Malignant neoplasm of upper lobe, unspecified bronchus or lung: Secondary | ICD-10-CM

## 2012-01-05 DIAGNOSIS — C3490 Malignant neoplasm of unspecified part of unspecified bronchus or lung: Secondary | ICD-10-CM

## 2012-01-05 DIAGNOSIS — C7931 Secondary malignant neoplasm of brain: Secondary | ICD-10-CM

## 2012-01-05 LAB — COMPREHENSIVE METABOLIC PANEL
ALT: 8 U/L (ref 0–35)
Albumin: 3.5 g/dL (ref 3.5–5.2)
Alkaline Phosphatase: 117 U/L (ref 39–117)
Potassium: 3 mEq/L — ABNORMAL LOW (ref 3.5–5.3)
Sodium: 142 mEq/L (ref 135–145)
Total Bilirubin: 0.8 mg/dL (ref 0.3–1.2)
Total Protein: 6.3 g/dL (ref 6.0–8.3)

## 2012-01-05 LAB — CBC WITH DIFFERENTIAL/PLATELET
BASO%: 0.5 % (ref 0.0–2.0)
LYMPH%: 7.7 % — ABNORMAL LOW (ref 14.0–49.7)
MCHC: 32.8 g/dL (ref 31.5–36.0)
MCV: 94.2 fL (ref 79.5–101.0)
MONO#: 0.6 10*3/uL (ref 0.1–0.9)
MONO%: 8.6 % (ref 0.0–14.0)
NEUT#: 5.2 10*3/uL (ref 1.5–6.5)
Platelets: 248 10*3/uL (ref 145–400)
RBC: 4.06 10*6/uL (ref 3.70–5.45)
RDW: 15.8 % — ABNORMAL HIGH (ref 11.2–14.5)
WBC: 6.5 10*3/uL (ref 3.9–10.3)

## 2012-01-05 MED ORDER — ERLOTINIB HCL 150 MG PO TABS: 150.0000 mg | ORAL_TABLET | Freq: Every day | ORAL | Status: AC

## 2012-01-05 NOTE — Telephone Encounter (Signed)
Gave appt calendar for July 15th 2013 lab and ML

## 2012-01-05 NOTE — Progress Notes (Signed)
Spoke with pt and niece at [facility] today.  Questions and concerns addressed 

## 2012-01-05 NOTE — Progress Notes (Signed)
[facility] [phone]   [phone]) [phone]  OFFICE PROGRESS NOTE  [name], MD [address] [place] [address]  DIAGNOSIS: Recurrent non-small cell lung cancer, adenocarcinoma, initially diagnosed with synchronous stage I involving the right upper lobe and left upper lobe in January 2007.   PRIOR THERAPY:  1. Status post bilateral stereotactic radiotherapy under the care of Dr. Abelardo Diesel at [facility] completed Nov 25, 2005. 2. Status post 6 cycles of systemic chemotherapy with carboplatin and paclitaxel for disease recurrence. Last dose given August 27, 2006 discontinued as the patient had disease stabilization. 3. Status post 6 more cycles of systemic chemotherapy for disease progression with carboplatin and paclitaxel, last dose given July 22, 2010 when the patient had disease stabilization and the treatment was discontinued at that time. 4. Systemic chemotherapy with Alimta at 500 mg/m2 given every 3 weeks status post 9 cycles.  CURRENT THERAPY: Palliative radiotherapy to the metastatic lesion at the right proximal femur.  CODE STATUS: No CODE BLUE  INTERVAL HISTORY: [name] 71 y.o. female returns to the clinic today for followup visit accompanied by her niece. 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 She was started on treatment with subcutaneous Lovenox followed by her primary care physician. The patient continues to complain of fatigue and weakness. She is here today for evaluation and discussion of her treatment options. She has shortness breath with exertion. No significant nausea or vomiting.   MEDICAL HISTORY: Past Medical History  Diagnosis Date  . Hypertension   . Heart murmur   . Hip fracture, left   . COPD (chronic obstructive pulmonary disease)   . Skin cancer   . Lung cancer 09/18/2005    rul =[facility] dx  .  Cancer 11/01/11     mets rightfemur//iliac 5.6cm,&left iliac  . Skin cancer   . 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 Scoliosis 11/01/11    l3  . History of chemotherapy     6 cycles carboplatin/paclitaxel,last dose 08/2006,again given 07/22/2010,alimta q 3weeks s/p 9 cycless  . History of radiation therapy 08/10/2007-09/07/2007    right paratracheal area/ Dr.Kinard, [place]  . History of radiation therapy 2007    bilateral sterotatic radiotherapy Dr[facility]McMullen at [facility] completed 11/25/2005  . Anemia     ALLERGIES:  is allergic to other.  MEDICATIONS:  Current Outpatient Prescriptions  Medication Sig Dispense Refill  . bisoprolol (ZEBETA) 5 MG tablet Take 2.5 mg by mouth daily.       Marland Kitchen dexamethasone (DECADRON) 4 MG tablet Take 4 mg by mouth as directed. Take 1 tablet twice daily the day before,the day of and the day after chemo      . enoxaparin (LOVENOX) 60 MG/0.6ML injection Inject 0.55 mLs (55 mg total) into the skin every 12 (twelve) hours.  10 mL  1  . folic acid (FOLVITE) 1 MG tablet Take 1 mg by mouth daily.        . furosemide (LASIX) 20 MG tablet Take 1 tablet (20 mg total) by mouth 2 (two) times daily. For edema  30 tablet  0  . lisinopril (PRINIVIL,ZESTRIL) 20 MG tablet Take 20 mg by mouth daily.        Marland Kitchen oxyCODONE-acetaminophen (PERCOCET) 5-325 MG per tablet Take 1 tablet by mouth every 4 (four) hours as needed. take 1-2 every 4-6hrs      .  tiotropium (SPIRIVA) 18 MCG inhalation capsule Place 18 mcg into inhaler and inhale daily.      Marland Kitchen oxyCODONE (OXYCONTIN) 15 MG TB12 Take 1 tablet (15 mg total) by mouth every 12 (twelve) hours.  30 tablet  0   No current facility-administered medications for this visit.   Facility-Administered Medications Ordered in Other Visits  Medication Dose Route Frequency Provider Last Rate Last Dose  . heparin lock flush 100 unit/mL  500 Units Intravenous Once Si Gaul, MD      . sodium chloride 0.9 % injection 10 mL   10 mL Intravenous PRN Si Gaul, MD        SURGICAL HISTORY:  Past Surgical History  Procedure Date  . Portacath placement   . Hip surgery     LEFT HIP  . Femur im nail 11/13/2011    Procedure: INTRAMEDULLARY (IM) NAIL FEMORAL;  Surgeon: Senaida Lange, MD;  Location: [facility] OR;  Service: Orthopedics;  Laterality: Right;  IM NAILING OF RIGHT HIP    REVIEW OF SYSTEMS:  A comprehensive review of systems was negative except for: Constitutional: positive for anorexia, fatigue and weight loss Respiratory: positive for cough and dyspnea on exertion Musculoskeletal: positive for bone pain and muscle weakness   PHYSICAL EXAMINATION: General appearance: alert, cooperative and no distress Neck: no adenopathy Lymph nodes: Cervical, supraclavicular, and axillary nodes normal. Resp: clear to auscultation bilaterally Cardio: regular rate and rhythm, S1, S2 normal, no murmur, click, rub or gallop GI: soft, non-tender; bowel sounds normal; no masses,  no organomegaly Extremities: extremities normal, atraumatic, no cyanosis or edema Neurologic: Alert and oriented X 3, normal strength and tone. Normal symmetric reflexes. Normal coordination and gait  ECOG PERFORMANCE STATUS: 1 - Symptomatic but completely ambulatory  Blood pressure 141/80, pulse 52, temperature 98.1 F (36.7 C), temperature source Oral, height 5\' 4"  (1.626 m), weight 126 lb 4.8 oz (57.289 kg).  LABORATORY DATA: Lab Results  Component Value Date   WBC 6.5 01/05/2012   HGB 12.6 01/05/2012   HCT 38.3 01/05/2012   MCV 94.2 01/05/2012   PLT 248 01/05/2012      Chemistry      Component Value Date/Time   NA 137 12/13/2011 0500   NA 144 08/21/2011 1131   K 3.8 12/13/2011 0500   K 5.1* 08/21/2011 1131   CL 96 12/13/2011 0500   CL 94* 08/21/2011 1131   CO2 35* 12/13/2011 0500   CO2 33 08/21/2011 1131   BUN 8 12/13/2011 0500   BUN 14 08/21/2011 1131   CREATININE 0.39* 12/13/2011 0500   CREATININE 0.5* 08/21/2011 1131      Component Value Date/Time     CALCIUM 9.2 12/13/2011 0500   CALCIUM 9.7 08/21/2011 1131   ALKPHOS 105 11/26/2011 0950   ALKPHOS 108* 08/21/2011 1131   AST 19 11/26/2011 0950   AST 23 08/21/2011 1131   ALT 13 11/26/2011 0950   BILITOT 0.9 11/26/2011 0950   BILITOT 0.70 08/21/2011 1131       RADIOGRAPHIC STUDIES: Dg Chest 1 View  12/07/2011  *RADIOLOGY REPORT*  Clinical Data: Fall.  History of lung cancer.  CHEST - 1 VIEW  Comparison: Chest 11/11/2011.  The CT chest 11/26/2011.  Findings: Cardiac enlargement.  Normal pulmonary vascularity. Emphysematous changes in the lungs with scattered fibrosis.  Focal scarring in the right upper lung may represent postradiation change.  Spiculated appearing nodular opacities in both hilar regions may be related to scarring but metastasis is not excluded. No focal airspace  consolidation.  No blunting of costophrenic angles.  No pneumothorax.  Degenerative changes and scoliosis of the spine.  Calcification of the aorta.  Right power port type central venous catheter with tip over the mid SVC region.  IMPRESSION: Cardiac enlargement.  Emphysema and fibrosis in the lungs. Scarring bilaterally.  Spiculated appearing nodular opacities in both hilar regions.  No active infiltration.  Original Report Authenticated By: Marlon Pel, M.D.   Dg Chest 2 View  12/11/2011  *RADIOLOGY REPORT*  Clinical Data: Shortness of breath, cough and congestion.  Hypoxia.  CHEST - 2 VIEW  Comparison: CT chest and chest radiograph 12/07/2011.  Findings: Trachea is midline.  Heart is enlarged.  Thoracic aorta is calcified.  Right IJ Port-A-Cath tip projects over the SVC.  Linear densities are seen in the perihilar regions bilaterally, with confluence at the hila.  Minimal streaky density at the lung bases bilaterally.  Tiny bilateral pleural effusions. Biapical pleural thickening.  IMPRESSION:  1.  Linear densities in the perihilar regions with confluence of the hilum, as on 12/07/2011. Scattered pulmonary nodules are  better seen on 12/07/2011. 2.  Minimal bibasilar atelectasis and tiny bilateral pleural effusions.  Original Report Authenticated By: Reyes Ivan, M.D.   Dg Femur Right  12/07/2011  *RADIOLOGY REPORT*  Clinical Data: Pain after fall.  RIGHT FEMUR - 2 VIEW  Comparison: 11/13/2011  Findings: Postoperative changes in the right hip with intramedullary rod and screw and compression screw fixation.  No evidence of acute fracture or displacement.  No focal bone lesion or bone destruction.  Bone cortex and trabecular architecture appear intact.  Vascular calcifications.  Degenerative changes in the knee.  No significant change in positioning of hardware since previous study.  IMPRESSION: Postoperative changes with internal fixation of the proximal right femur.  No acute bony abnormalities identified.  Original Report Authenticated By: Marlon Pel, M.D.   Ct Head Without Contrast  12/07/2011  *RADIOLOGY REPORT*  Clinical Data: Right arm weakness  CT HEAD WITHOUT CONTRAST  Technique:  Contiguous axial images were obtained from the base of the skull through the vertex without contrast.  Comparison: 08/05/2010  Findings: Stable mild brain atrophy most pronounced in the frontal lobes.  No acute intracranial hemorrhage, definite mass lesion, infarction, midline shift, herniation, or hydrocephalus.  Gray- white matter differentiation maintained.  Cisterns patent.  No cerebellar abnormality.  Visualized mastoids and sinuses clear.  IMPRESSION: Stable exam.  No acute intracranial process by noncontrast CT  Original Report Authenticated By: Judie Petit. Ruel Favors, M.D.   Ct Angio Chest W/cm &/or Wo Cm  12/07/2011  **ADDENDUM** CREATED: 12/07/2011 05:40:23  The esophagus is mildly distended with an air-fluid level suggesting dysmotility.  **END ADDENDUM** SIGNED BY: Marlon Pel, M.D.   12/07/2011  *RADIOLOGY REPORT*  Clinical Data: Short of breath.  History of lung cancer and COPD. Fall.  CT ANGIOGRAPHY CHEST   Technique:  Multidetector CT imaging of the chest using the standard protocol during bolus administration of intravenous contrast. Multiplanar reconstructed images including MIPs were obtained and reviewed to evaluate the vascular anatomy.  Contrast: OMNIPAQUE IOHEXOL 350 MG/ML SOLN  Comparison: 11/26/2011  Findings: Technically adequate study with good opacification of the central and segmental pulmonary arteries.  There are focal intraluminal filling defects in the scattered bilateral segmental branches consistent with pulmonary emboli.  Cardiac enlargement.  Calcification of the aortic valve and coronary arteries.  Calcification and ectasia of the aorta. Scarring in the mid lungs bilaterally with mass or adenopathy  in the right hilum measuring about 15 mm diameter.  Pretracheal lymphadenopathy measures 12 mm short axis dimension.  Multiple pulmonary nodules are again demonstrated bilaterally consistent with metastatic disease.  Probably the largest is a spiculated nodule in the right upper lung measuring 8 mm diameter. Visualization of the lungs is limited due to respiratory motion artifact.  Diffuse emphysematous changes and fibrosis throughout the lungs.  Atelectasis in the lung bases.  Focal wedge-shaped consolidation in the right lung base posteriorly may represent a focal infarct.  Bronchiectasis and mucus plugging in the lung bases with peribronchial thickening consistent with chronic bronchitis. Bilateral adrenal gland nodules measuring 2.6 cm on the left and 3.3 cm on the right.  These demonstrate low density measurements suggesting adenomas.  Degenerative changes in the thoracic spine. No destructive bone lesions appreciated. Minimal right pleural effusion.  IMPRESSION: Positive study for multiple bilateral segmental pulmonary emboli. Possible small peripheral infarct in the right lung base posteriorly.  Again demonstrated are multiple diffuse bilateral pulmonary metastases, right hilar mass,  diffuse emphysematous changes, and fibrosis. Chronic bronchitic changes.  Critical Value/emergent results were called by telephone at the time of interpretation on 12/07/2011  at 0440 hours  to  Dr. Hyacinth Meeker, who verbally acknowledged these results. Cardiac enlargement.  Bilateral adrenal gland nodules.  Original Report Authenticated By: Marlon Pel, M.D.   Mr Laqueta Jean Wo Contrast  12/07/2011  *RADIOLOGY REPORT*  Clinical Data: 71 year old female with metastatic cancer.  Right upper extremity weakness.  MRI HEAD WITHOUT AND WITH CONTRAST  Technique:  Multiplanar, multiecho pulse sequences of the brain and surrounding structures were obtained according to standard protocol without and with intravenous contrast  Contrast: 10mL MULTIHANCE GADOBENATE DIMEGLUMINE 529 MG/ML IV SOLN  Comparison: Head CTs 12/07/2011 and earlier.  Findings: Multiple small foci of restricted diffusion scattered in the right hemisphere, including the frontal lobe (left upper extremity motor cortex regions series 4 image 18), parietal lobe, and also a fairly dense involvement in the left occipital pole (series 4 image 7).  Additionally, there is a wedge-shaped infarct with restricted diffusion in the left cerebellum (image 4).  Also, there are two suspicious foci of possible diffusion restriction in the right hemisphere, pre motor cortex on image 20 and possible focal right occipital lobe cortex involvement on image 80.  None of these areas show evidence of hemorrhage or mass effect. Major intracranial vascular flow voids are preserved.  No ventriculomegaly.  No midline shift.  No abnormal enhancement or mass identified in the brain.  Chronic lacunar infarcts occasionally noted in the deep gray matter nuclei and cerebellum.  Overall normal cerebral volume. Negative pituitary and cervicomedullary junction.  Visualized bone marrow signal is within normal limits. Degenerative changes and cervical spine. Visualized orbit soft tissues are  within normal limits.  Visualized paranasal sinuses and mastoids are clear.  Negative scalp soft tissues.  IMPRESSION: 1.  Multiple scattered small acute infarcts in the left hemisphere involving both the MCA and PCA territories.  Left upper extremity motor cortex region is affected. 2.  Small wedge-shaped acute infarct also in the left cerebellum. 3.  Suggestion of several punctate infarcts also in the right hemisphere.  This constellation raise the possibility of recent embolic event. 4.  No mass effect or hemorrhage.  Mild for age underlying chronic small vessel ischemia. 5.  No metastatic disease identified.  Original Report Authenticated By: Harley Hallmark, M.D.    ASSESSMENT: This is a very pleasant 72 years old white female with metastatic  non-small cell lung cancer and recently diagnosed bilateral pulmonary emboli as well as stroke. She is currently receiving palliative radiotherapy to the right femur.  PLAN: I have a lengthy discussion with the patient today about her current condition and treatment options. I recommended for her to continue on treatment with Lovenox for the pulmonary emboli. I gave the patient the option of treatment with oral Tarceva versus palliative care and hospice referral. The patient and her niece is still interested in some form of treatment. I will start her on Tarceva 150 mg by mouth daily. I discussed with the patient adverse effect of this treatment including but not limited to skin rash, diarrhea, interstitial lung disease, liver or renal dysfunction. We would send her a prescription to [facility]. She would come back for followup visit in 2 weeks for evaluation and management any adverse effect of her treatment. She was advised to call me immediately if she has any concerning symptoms in the interval.  All questions were answered. The patient knows to call the clinic with any problems, questions or concerns. We can certainly see the patient much sooner if  necessary.  I spent 15 minutes counseling the patient face to face. The total time spent in the appointment was 25 minutes.

## 2012-01-06 ENCOUNTER — Ambulatory Visit
Admission: RE | Admit: 2012-01-06 | Discharge: 2012-01-06 | Disposition: A | Discharge status: Home / Self Care | Payer: No Typology Code available for payment source | Source: Ambulatory Visit | Attending: Radiation Oncology | Admitting: Radiation Oncology

## 2012-01-06 ENCOUNTER — Telehealth: Payer: Self-pay | Admitting: *Deleted

## 2012-01-06 ENCOUNTER — Ambulatory Visit
Admission: RE | Admit: 2012-01-06 | Discharge: 2012-01-06 | Payer: No Typology Code available for payment source | Source: Ambulatory Visit | Attending: Radiation Oncology | Admitting: Radiation Oncology

## 2012-01-06 ENCOUNTER — Encounter: Payer: Self-pay | Admitting: *Deleted

## 2012-01-06 VITALS — Wt 127.0 lb

## 2012-01-06 DIAGNOSIS — C3490 Malignant neoplasm of unspecified part of unspecified bronchus or lung: Secondary | ICD-10-CM

## 2012-01-06 DIAGNOSIS — C7951 Secondary malignant neoplasm of bone: Secondary | ICD-10-CM

## 2012-01-06 NOTE — Progress Notes (Signed)
Received call from [facility] regarding Tarceva.  Prior Berkley Harvey is requred, the RN at [facility] will complete the prior auth but they need most recent notes, faxed notes to [place] [phone].  SLJ

## 2012-01-06 NOTE — Telephone Encounter (Signed)
[name] called requesting Rx BIN, RX, Group  And Member ID number's for the [facility] [employer].  Information given.

## 2012-01-06 NOTE — Progress Notes (Signed)
   Department of Radiation Oncology  Phone:  [phone] Fax:        [phone]    Weekly Management Note Current Dose: 15.0 Gy  Projected Dose:35.0 Gy   Narrative:  The patient presents for routine under treatment assessment.  CBCT/MVCT images/Port film x-rays were reviewed.  The chart was checked. She continues to have pain in the right upper pelvis as well as right upper leg area. According to family members the patient was seen by Dr. supple earlier this week and x-ray showed progression along the right femur with mild fracture. In light of this was recommended patient remain in a wheelchair and to use her walker on a limited basis.  Patient is on OxyContin and oxycodone for breakthrough pain.  Physical Findings:  This is sitting in her wheelchair. She responds verbally to questions. Palpation along the right upper leg area reveals no obvious point tenderness.  Vitals: There were no vitals filed for this visit. Weight:  Wt Readings from Last 3 Encounters:  01/06/12 127 lb (57.607 kg)  01/05/12 126 lb 4.8 oz (57.289 kg)  12/30/11 127 lb 8 oz (57.834 kg)   Lab Results  Component Value Date   WBC 6.5 01/05/2012   HGB 12.6 01/05/2012   HCT 38.3 01/05/2012   MCV 94.2 01/05/2012   PLT 248 01/05/2012   Lab Results  Component Value Date   CREATININE 0.45* 01/05/2012   BUN 6 01/05/2012   NA 142 01/05/2012   K 3.0* 01/05/2012   CL 87* 01/05/2012   CO2 45* 01/05/2012     Impression:  The patient is tolerating radiation.  Plan:  Continue treatment as planned.  -----------------------------------  Billie Lade, PhD, MD

## 2012-01-06 NOTE — Progress Notes (Signed)
HERE TODAY FOR PUT OF RIGHT FEMUR / ILIAC BONE.  C/O PAIN 7/10 , TAKES OXYCODONE FOR THIS BUT HASN'T  T AKEN TODAY.  RIGHT LEG WITH NON PITTING EDEMA

## 2012-01-07 ENCOUNTER — Ambulatory Visit
Admission: RE | Admit: 2012-01-07 | Discharge: 2012-01-07 | Disposition: A | Discharge status: Home / Self Care | Payer: No Typology Code available for payment source | Source: Ambulatory Visit | Attending: Radiation Oncology | Admitting: Radiation Oncology

## 2012-01-07 ENCOUNTER — Other Ambulatory Visit: Payer: Self-pay | Admitting: *Deleted

## 2012-01-07 ENCOUNTER — Telehealth: Payer: Self-pay | Admitting: *Deleted

## 2012-01-07 DIAGNOSIS — E876 Hypokalemia: Secondary | ICD-10-CM

## 2012-01-07 MED ORDER — POTASSIUM CHLORIDE CRYS ER 20 MEQ PO TBCR: 20.0000 meq | EXTENDED_RELEASE_TABLET | Freq: Every day | ORAL | Status: DC

## 2012-01-07 NOTE — Progress Notes (Signed)
Pt aware to take K 20meq x 7 days.  SLJ 

## 2012-01-07 NOTE — Telephone Encounter (Signed)
In response to tarceva.  Tarceva has been approved through July 06, 2012.

## 2012-01-07 NOTE — Progress Notes (Signed)
Quick Note:  Call patient with the result and RX KDur 20 meq po qd X 7 ______ 

## 2012-01-09 ENCOUNTER — Ambulatory Visit
Admission: RE | Admit: 2012-01-09 | Discharge: 2012-01-09 | Disposition: A | Discharge status: Home / Self Care | Payer: No Typology Code available for payment source | Source: Ambulatory Visit | Attending: Radiation Oncology | Admitting: Radiation Oncology

## 2012-01-12 ENCOUNTER — Encounter: Payer: Self-pay | Admitting: *Deleted

## 2012-01-12 ENCOUNTER — Ambulatory Visit
Admission: RE | Admit: 2012-01-12 | Discharge: 2012-01-12 | Disposition: A | Discharge status: Home / Self Care | Payer: No Typology Code available for payment source | Source: Ambulatory Visit | Attending: Radiation Oncology | Admitting: Radiation Oncology

## 2012-01-12 NOTE — Progress Notes (Signed)
RECEIVED A FAX FROM [facility] CONCERNING A CONFIRMATION OF PRESCRIPTION SHIPMENT FOR TARCEVA. 

## 2012-01-13 ENCOUNTER — Ambulatory Visit
Admission: RE | Admit: 2012-01-13 | Discharge: 2012-01-13 | Disposition: A | Discharge status: Home / Self Care | Payer: No Typology Code available for payment source | Source: Ambulatory Visit | Attending: Radiation Oncology | Admitting: Radiation Oncology

## 2012-01-13 DIAGNOSIS — C7951 Secondary malignant neoplasm of bone: Secondary | ICD-10-CM

## 2012-01-13 NOTE — Progress Notes (Cosign Needed)
Weekly Management Note Current Dose:  25 Gy  Projected 35 Gy   Narrative:  The patient presents for routine under treatment assessment.  CBCT/MVCT images/Port film x-rays were reviewed.  The chart was checked.  She continues to have pain in the right pelvis and right upper leg area.  Dr. Rennis Chris has recommended no weightbearing along the right side given recent x-rays in his office. She has completed her treatments to the right femur area. Her current set up as directed the right iliac bone region.   Physical exam: Patient is sitting comfortably her in a wheelchair she has some edema in both lower extremities  Impression:  The patient is tolerating radiation.  Plan:  Continue treatment as planned.

## 2012-01-13 NOTE — Progress Notes (Signed)
HERE TODAY FOR PUT OF RIGHT FEMUR.  RATES PAIN 3/10 AFTER PAIN MED.   DID NOT FEEL LIKE STANDING TO WEIGH

## 2012-01-14 ENCOUNTER — Ambulatory Visit
Admission: RE | Admit: 2012-01-14 | Discharge: 2012-01-14 | Disposition: A | Discharge status: Home / Self Care | Payer: No Typology Code available for payment source | Source: Ambulatory Visit | Attending: Radiation Oncology | Admitting: Radiation Oncology

## 2012-01-15 ENCOUNTER — Telehealth: Payer: Self-pay | Admitting: Medical Oncology

## 2012-01-15 ENCOUNTER — Ambulatory Visit
Admission: RE | Admit: 2012-01-15 | Discharge: 2012-01-15 | Disposition: A | Discharge status: Home / Self Care | Payer: No Typology Code available for payment source | Source: Ambulatory Visit | Attending: Radiation Oncology | Admitting: Radiation Oncology

## 2012-01-15 DIAGNOSIS — E876 Hypokalemia: Secondary | ICD-10-CM

## 2012-01-15 MED ORDER — POTASSIUM CHLORIDE CRYS ER 20 MEQ PO TBCR: 20.0000 meq | EXTENDED_RELEASE_TABLET | Freq: Every day | ORAL | Status: DC

## 2012-01-15 NOTE — Telephone Encounter (Signed)
Message copied by Charma Igo on Thu Jan 15, 2012  4:06 PM ------      Message from: Si Gaul      Created: Wed Jan 07, 2012 12:09 AM       Call patient with the result and RX K Dur 20 meq po qd X 7

## 2012-01-16 ENCOUNTER — Ambulatory Visit
Admission: RE | Admit: 2012-01-16 | Discharge: 2012-01-16 | Disposition: A | Discharge status: Home / Self Care | Payer: No Typology Code available for payment source | Source: Ambulatory Visit | Attending: Radiation Oncology | Admitting: Radiation Oncology

## 2012-01-19 ENCOUNTER — Ambulatory Visit
Admission: RE | Admit: 2012-01-19 | Discharge: 2012-01-19 | Disposition: A | Discharge status: Home / Self Care | Payer: No Typology Code available for payment source | Source: Ambulatory Visit | Attending: Radiation Oncology | Admitting: Radiation Oncology

## 2012-01-19 ENCOUNTER — Telehealth: Payer: Self-pay | Admitting: Internal Medicine

## 2012-01-19 ENCOUNTER — Ambulatory Visit (HOSPITAL_BASED_OUTPATIENT_CLINIC_OR_DEPARTMENT_OTHER): Payer: No Typology Code available for payment source | Admitting: Physician Assistant

## 2012-01-19 ENCOUNTER — Other Ambulatory Visit: Payer: No Typology Code available for payment source | Admitting: Lab

## 2012-01-19 ENCOUNTER — Encounter: Payer: Self-pay | Admitting: Radiation Oncology

## 2012-01-19 VITALS — BP 148/75 | HR 90 | Temp 97.0°F | Ht 64.0 in | Wt 126.0 lb

## 2012-01-19 DIAGNOSIS — C7951 Secondary malignant neoplasm of bone: Secondary | ICD-10-CM

## 2012-01-19 DIAGNOSIS — C3490 Malignant neoplasm of unspecified part of unspecified bronchus or lung: Secondary | ICD-10-CM

## 2012-01-19 DIAGNOSIS — C7952 Secondary malignant neoplasm of bone marrow: Secondary | ICD-10-CM

## 2012-01-19 LAB — COMPREHENSIVE METABOLIC PANEL
AST: 12 U/L (ref 0–37)
Alkaline Phosphatase: 72 U/L (ref 39–117)
Glucose, Bld: 135 mg/dL — ABNORMAL HIGH (ref 70–99)
Sodium: 140 mEq/L (ref 135–145)
Total Bilirubin: 0.6 mg/dL (ref 0.3–1.2)
Total Protein: 5.7 g/dL — ABNORMAL LOW (ref 6.0–8.3)

## 2012-01-19 LAB — CBC WITH DIFFERENTIAL/PLATELET
BASO%: 0.6 % (ref 0.0–2.0)
EOS%: 4 % (ref 0.0–7.0)
LYMPH%: 11.9 % — ABNORMAL LOW (ref 14.0–49.7)
MCH: 30.8 pg (ref 25.1–34.0)
MCHC: 32.9 g/dL (ref 31.5–36.0)
MCV: 93.9 fL (ref 79.5–101.0)
MONO%: 12.1 % (ref 0.0–14.0)
Platelets: 203 10*3/uL (ref 145–400)
RBC: 3.68 10*6/uL — ABNORMAL LOW (ref 3.70–5.45)

## 2012-01-19 NOTE — Progress Notes (Signed)
Weekly Management Note Current Dose: 35.0Gy  Projected Dose: 35.0 Gy   Narrative:  The patient presents for routine under treatment assessment.  CBCT/MVCT images/Port film x-rays were reviewed.  The chart was checked.  She completes her radiation therapy directed at the right iliac bone today. She is also recently completed her postoperative treatment to the right femur.  She unfortunately continues to have pain in both areas.  Physical Findings: Weight:  . Unchanged  Impression:  The patient is tolerating radiation.  Plan:  Followup in one month. Patient will be seeing Dr. Arbutus Ped in the interim and will likely start chemotherapy in pill form

## 2012-01-19 NOTE — Progress Notes (Signed)
HERE TODAY FOR PUT OF RIGHT HIP AND FEMUR.  C/O PAIN RATES 4/10, TOOK OXYCODONE ABOUT 1200 TODAY.  SKIN IS PINK TO LIGHT RED. NO C/O ITCHING TODAY.  HAD A BOUT WITH NAUSEA COUPLE OF NIGHTS AGO THAT SUBSIDED ON ITS OWN.  APPEARS TIRED AND SLEEPY TODAY.  FINAL TX

## 2012-01-19 NOTE — Progress Notes (Signed)
[facility] [phone]   [phone]  OFFICE PROGRESS NOTE  [name], MD [address] [place] [address]  DIAGNOSIS: Recurrent non-small cell lung cancer, adenocarcinoma, initially diagnosed with synchronous stage I involving the right upper lobe and left upper lobe in January 2007.   PRIOR THERAPY:  1. Status post bilateral stereotactic radiotherapy under the care of Dr. Abelardo Diesel at [facility] completed Nov 25, 2005. 2. Status post 6 cycles of systemic chemotherapy with carboplatin and paclitaxel for disease recurrence. Last dose given August 27, 2006 discontinued as the patient had disease stabilization. 3. Status post 6 more cycles of systemic chemotherapy for disease progression with carboplatin and paclitaxel, last dose given July 22, 2010 when the patient had disease stabilization and the treatment was discontinued at that time. 4. Systemic chemotherapy with Alimta at 500 mg/m2 given every 3 weeks status post 9 cycles.  CURRENT THERAPY:  1. Palliative radiotherapy to the metastatic lesion at the right proximal femur. 2. Tarceva 150 mg by mouth daily-medication received that therapy not initiated inject  CODE STATUS: No CODE BLUE  INTERVAL HISTORY: [name] 71 y.o. female returns to the clinic today for followup visit accompanied by her niece. 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 She was started on treatment with subcutaneous Lovenox followed by her primary care physician. She is completing a course of antibiotics for bladder infection. She is received her Tarceva but has not started it as yet. She and her niece had a few questions that they wanted and answered/clarified before initiating therapy with Tarceva. The patient continues to complain of fatigue and weakness.  She has shortness breath with exertion. No  significant nausea or vomiting.   MEDICAL HISTORY: Past Medical History  Diagnosis Date  . Hypertension   . Heart murmur   . Hip fracture, left   . COPD (chronic obstructive pulmonary disease)   . Skin cancer   . Lung cancer 09/18/2005    rul =[facility] dx  . Cancer 11/01/11     mets rightfemur//iliac 5.6cm,&left iliac  . Skin cancer   . 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 Scoliosis 11/01/11    l3  . History of chemotherapy     6 cycles carboplatin/paclitaxel,last dose 08/2006,again given 07/22/2010,alimta q 3weeks s/p 9 cycless  . History of radiation therapy 08/10/2007-09/07/2007    right paratracheal area/ Dr.Kinard, [place]  . History of radiation therapy 2007    bilateral sterotatic radiotherapy Dr.McMullen at [facility] completed 11/25/2005  . Anemia     ALLERGIES:  is allergic to other.  MEDICATIONS:  Current Outpatient Prescriptions  Medication Sig Dispense Refill  . bisoprolol (ZEBETA) 5 MG tablet Take 2.5 mg by mouth daily.       Marland Kitchen dexamethasone (DECADRON) 4 MG tablet Take 4 mg by mouth as directed. Take 1 tablet twice daily the day before,the day of and the day after chemo      . enoxaparin (LOVENOX) 60 MG/0.6ML injection Inject 0.55 mLs (55 mg total) into the skin every 12 (twelve) hours.  10 mL  1  . erlotinib (TARCEVA) 150 MG tablet Take 1 tablet (150 mg total) by mouth daily. Take on an empty stomach 1 hour before meals or 2 hours after.  30 tablet  2  . folic acid (FOLVITE) 1 MG tablet Take 1  mg by mouth daily.        . furosemide (LASIX) 20 MG tablet Take 1 tablet (20 mg total) by mouth 2 (two) times daily. For edema  30 tablet  0  . lisinopril (PRINIVIL,ZESTRIL) 20 MG tablet Take 20 mg by mouth daily.        Marland Kitchen oxyCODONE (OXYCONTIN) 15 MG TB12 Take 1 tablet (15 mg total) by mouth every 12 (twelve) hours.  30 tablet  0  . oxyCODONE-acetaminophen (PERCOCET) 5-325 MG per tablet Take 1 tablet by mouth every 4 (four) hours as needed. take 1-2 every  4-6hrs      . potassium chloride SA (K-DUR,KLOR-CON) 20 MEQ tablet Take 1 tablet (20 mEq total) by mouth daily. X 7 days  7 tablet  0  . tiotropium (SPIRIVA) 18 MCG inhalation capsule Place 18 mcg into inhaler and inhale daily.       No current facility-administered medications for this visit.   Facility-Administered Medications Ordered in Other Visits  Medication Dose Route Frequency Provider Last Rate Last Dose  . heparin lock flush 100 unit/mL  500 Units Intravenous Once Si Gaul, MD      . sodium chloride 0.9 % injection 10 mL  10 mL Intravenous PRN Si Gaul, MD        SURGICAL HISTORY:  Past Surgical History  Procedure Date  . Portacath placement   . Hip surgery     LEFT HIP  . Femur im nail 11/13/2011    Procedure: INTRAMEDULLARY (IM) NAIL FEMORAL;  Surgeon: Senaida Lange, MD;  Location: [facility] OR;  Service: Orthopedics;  Laterality: Right;  IM NAILING OF RIGHT HIP    REVIEW OF SYSTEMS:  A comprehensive review of systems was negative except for: Constitutional: positive for anorexia, fatigue and weight loss Respiratory: positive for cough and dyspnea on exertion Musculoskeletal: positive for bone pain and muscle weakness   PHYSICAL EXAMINATION: General appearance: alert, cooperative and no distress Neck: no adenopathy Lymph nodes: Cervical, supraclavicular, and axillary nodes normal. Resp: clear to auscultation bilaterally Cardio: regular rate and rhythm, S1, S2 normal, no murmur, click, rub or gallop GI: soft, non-tender; bowel sounds normal; no masses,  no organomegaly Extremities: extremities normal, atraumatic, no cyanosis or edema Neurologic: Alert and oriented X 3, normal strength and tone. Normal symmetric reflexes. Normal coordination and gait  ECOG PERFORMANCE STATUS: 1 - Symptomatic but completely ambulatory  Blood pressure 148/75, pulse 90, temperature 97 F (36.1 C), temperature source Oral, height 5\' 4"  (1.626 m), weight 126 lb (57.153  kg).  LABORATORY DATA: Lab Results  Component Value Date   WBC 4.6 01/19/2012   HGB 11.3* 01/19/2012   HCT 34.5* 01/19/2012   MCV 93.9 01/19/2012   PLT 203 01/19/2012      Chemistry      Component Value Date/Time   NA 140 01/19/2012 1347   NA 144 08/21/2011 1131   K 3.5 01/19/2012 1347   K 5.1* 08/21/2011 1131   CL 97 01/19/2012 1347   CL 94* 08/21/2011 1131   CO2 40* 01/19/2012 1347   CO2 33 08/21/2011 1131   BUN 8 01/19/2012 1347   BUN 14 08/21/2011 1131   CREATININE 0.45* 01/19/2012 1347   CREATININE 0.5* 08/21/2011 1131      Component Value Date/Time   CALCIUM 9.4 01/19/2012 1347   CALCIUM 9.7 08/21/2011 1131   ALKPHOS 72 01/19/2012 1347   ALKPHOS 108* 08/21/2011 1131   AST 12 01/19/2012 1347   AST 23 08/21/2011 1131  ALT <8 01/19/2012 1347   BILITOT 0.6 01/19/2012 1347   BILITOT 0.70 08/21/2011 1131       RADIOGRAPHIC STUDIES: Dg Chest 1 View  12/07/2011  *RADIOLOGY REPORT*  Clinical Data: Fall.  History of lung cancer.  CHEST - 1 VIEW  Comparison: Chest 11/11/2011.  The CT chest 11/26/2011.  Findings: Cardiac enlargement.  Normal pulmonary vascularity. Emphysematous changes in the lungs with scattered fibrosis.  Focal scarring in the right upper lung may represent postradiation change.  Spiculated appearing nodular opacities in both hilar regions may be related to scarring but metastasis is not excluded. No focal airspace consolidation.  No blunting of costophrenic angles.  No pneumothorax.  Degenerative changes and scoliosis of the spine.  Calcification of the aorta.  Right power port type central venous catheter with tip over the mid SVC region.  IMPRESSION: Cardiac enlargement.  Emphysema and fibrosis in the lungs. Scarring bilaterally.  Spiculated appearing nodular opacities in both hilar regions.  No active infiltration.  Original Report Authenticated By: Marlon Pel, M.D.   Dg Chest 2 View  12/11/2011  *RADIOLOGY REPORT*  Clinical Data: Shortness of breath, cough and  congestion.  Hypoxia.  CHEST - 2 VIEW  Comparison: CT chest and chest radiograph 12/07/2011.  Findings: Trachea is midline.  Heart is enlarged.  Thoracic aorta is calcified.  Right IJ Port-A-Cath tip projects over the SVC.  Linear densities are seen in the perihilar regions bilaterally, with confluence at the hila.  Minimal streaky density at the lung bases bilaterally.  Tiny bilateral pleural effusions. Biapical pleural thickening.  IMPRESSION:  1.  Linear densities in the perihilar regions with confluence of the hilum, as on 12/07/2011. Scattered pulmonary nodules are better seen on 12/07/2011. 2.  Minimal bibasilar atelectasis and tiny bilateral pleural effusions.  Original Report Authenticated By: Reyes Ivan, M.D.   Dg Femur Right  12/07/2011  *RADIOLOGY REPORT*  Clinical Data: Pain after fall.  RIGHT FEMUR - 2 VIEW  Comparison: 11/13/2011  Findings: Postoperative changes in the right hip with intramedullary rod and screw and compression screw fixation.  No evidence of acute fracture or displacement.  No focal bone lesion or bone destruction.  Bone cortex and trabecular architecture appear intact.  Vascular calcifications.  Degenerative changes in the knee.  No significant change in positioning of hardware since previous study.  IMPRESSION: Postoperative changes with internal fixation of the proximal right femur.  No acute bony abnormalities identified.  Original Report Authenticated By: Marlon Pel, M.D.   Ct Head Without Contrast  12/07/2011  *RADIOLOGY REPORT*  Clinical Data: Right arm weakness  CT HEAD WITHOUT CONTRAST  Technique:  Contiguous axial images were obtained from the base of the skull through the vertex without contrast.  Comparison: 08/05/2010  Findings: Stable mild brain atrophy most pronounced in the frontal lobes.  No acute intracranial hemorrhage, definite mass lesion, infarction, midline shift, herniation, or hydrocephalus.  Gray- white matter differentiation maintained.   Cisterns patent.  No cerebellar abnormality.  Visualized mastoids and sinuses clear.  IMPRESSION: Stable exam.  No acute intracranial process by noncontrast CT  Original Report Authenticated By: Judie Petit. Ruel Favors, M.D.   Ct Angio Chest W/cm &/or Wo Cm  12/07/2011  **ADDENDUM** CREATED: 12/07/2011 05:40:23  The esophagus is mildly distended with an air-fluid level suggesting dysmotility.  **END ADDENDUM** SIGNED BY: Marlon Pel, M.D.   12/07/2011  *RADIOLOGY REPORT*  Clinical Data: Short of breath.  History of lung cancer and COPD. Fall.  CT ANGIOGRAPHY  CHEST  Technique:  Multidetector CT imaging of the chest using the standard protocol during bolus administration of intravenous contrast. Multiplanar reconstructed images including MIPs were obtained and reviewed to evaluate the vascular anatomy.  Contrast: OMNIPAQUE IOHEXOL 350 MG/ML SOLN  Comparison: 11/26/2011  Findings: Technically adequate study with good opacification of the central and segmental pulmonary arteries.  There are focal intraluminal filling defects in the scattered bilateral segmental branches consistent with pulmonary emboli.  Cardiac enlargement.  Calcification of the aortic valve and coronary arteries.  Calcification and ectasia of the aorta. Scarring in the mid lungs bilaterally with mass or adenopathy in the right hilum measuring about 15 mm diameter.  Pretracheal lymphadenopathy measures 12 mm short axis dimension.  Multiple pulmonary nodules are again demonstrated bilaterally consistent with metastatic disease.  Probably the largest is a spiculated nodule in the right upper lung measuring 8 mm diameter. Visualization of the lungs is limited due to respiratory motion artifact.  Diffuse emphysematous changes and fibrosis throughout the lungs.  Atelectasis in the lung bases.  Focal wedge-shaped consolidation in the right lung base posteriorly may represent a focal infarct.  Bronchiectasis and mucus plugging in the lung bases with  peribronchial thickening consistent with chronic bronchitis. Bilateral adrenal gland nodules measuring 2.6 cm on the left and 3.3 cm on the right.  These demonstrate low density measurements suggesting adenomas.  Degenerative changes in the thoracic spine. No destructive bone lesions appreciated. Minimal right pleural effusion.  IMPRESSION: Positive study for multiple bilateral segmental pulmonary emboli. Possible small peripheral infarct in the right lung base posteriorly.  Again demonstrated are multiple diffuse bilateral pulmonary metastases, right hilar mass, diffuse emphysematous changes, and fibrosis. Chronic bronchitic changes.  Critical Value/emergent results were called by telephone at the time of interpretation on 12/07/2011  at 0440 hours  to  Dr. Hyacinth Meeker, who verbally acknowledged these results. Cardiac enlargement.  Bilateral adrenal gland nodules.  Original Report Authenticated By: Marlon Pel, M.D.   Mr Laqueta Jean Wo Contrast  12/07/2011  *RADIOLOGY REPORT*  Clinical Data: 71 year old female with metastatic cancer.  Right upper extremity weakness.  MRI HEAD WITHOUT AND WITH CONTRAST  Technique:  Multiplanar, multiecho pulse sequences of the brain and surrounding structures were obtained according to standard protocol without and with intravenous contrast  Contrast: 10mL MULTIHANCE GADOBENATE DIMEGLUMINE 529 MG/ML IV SOLN  Comparison: Head CTs 12/07/2011 and earlier.  Findings: Multiple small foci of restricted diffusion scattered in the right hemisphere, including the frontal lobe (left upper extremity motor cortex regions series 4 image 18), parietal lobe, and also a fairly dense involvement in the left occipital pole (series 4 image 7).  Additionally, there is a wedge-shaped infarct with restricted diffusion in the left cerebellum (image 4).  Also, there are two suspicious foci of possible diffusion restriction in the right hemisphere, pre motor cortex on image 20 and possible focal right  occipital lobe cortex involvement on image 80.  None of these areas show evidence of hemorrhage or mass effect. Major intracranial vascular flow voids are preserved.  No ventriculomegaly.  No midline shift.  No abnormal enhancement or mass identified in the brain.  Chronic lacunar infarcts occasionally noted in the deep gray matter nuclei and cerebellum.  Overall normal cerebral volume. Negative pituitary and cervicomedullary junction.  Visualized bone marrow signal is within normal limits. Degenerative changes and cervical spine. Visualized orbit soft tissues are within normal limits.  Visualized paranasal sinuses and mastoids are clear.  Negative scalp soft tissues.  IMPRESSION: 1.  Multiple scattered small acute infarcts in the left hemisphere involving both the MCA and PCA territories.  Left upper extremity motor cortex region is affected. 2.  Small wedge-shaped acute infarct also in the left cerebellum. 3.  Suggestion of several punctate infarcts also in the right hemisphere.  This constellation raise the possibility of recent embolic event. 4.  No mass effect or hemorrhage.  Mild for age underlying chronic small vessel ischemia. 5.  No metastatic disease identified.  Original Report Authenticated By: Harley Hallmark, M.D.    ASSESSMENT/PLAN: This is a very pleasant 71 years old white female with metastatic non-small cell lung cancer and recently diagnosed bilateral pulmonary emboli as well as stroke. She is currently receiving palliative radiotherapy to the right femur. The patient was discussed with Dr. Arbutus Ped. All the patient's questions regarding the Tarceva-have to take a medication and expected side effects were answered to the patient and her niece his satisfaction. She will begin therapy this evening. She will return in 2 weeks for a symptom management visit with a repeat CBC differential and C. met. She'll continue on subcutaneous Lovenox for her bilateral pulmonary emboli and followup with her  primary care physician as he is monitoring this therapy. She will complete her course of palliative radiotherapy to the right proximal femur as scheduled.  [name], Wendy Grumbine E, PA-C   All questions were answered. The patient knows to call the clinic with any problems, questions or concerns. We can certainly see the patient much sooner if necessary.  I spent 20 minutes counseling the patient face to face. The total time spent in the appointment was 30 minutes.

## 2012-01-19 NOTE — Telephone Encounter (Signed)
appts made and printed for pt  °

## 2012-01-20 NOTE — Progress Notes (Incomplete)
°  Radiation Oncology         [phone] ________________________________  Name: [name] MRN: [id number]  Date: 01/19/2012  DOB: 10-Feb-1941  End of Treatment Note  Diagnosis:   Recurrent and metastatic non-small lung cancer  Indication for treatment:  ***       Radiation treatment dates:   12/04/2011-01/19/2012  Site/dose:   ***  Beams/energy:   ***  Narrative: The patient tolerated radiation treatment relatively well.   ***  Plan: The patient has completed radiation treatment. The patient will return to radiation oncology clinic for routine followup in one month. I advised them to call or return sooner if they have any questions or concerns related to their recovery or treatment.  -----------------------------------  Billie Lade, PhD, MD

## 2012-02-01 NOTE — Progress Notes (Signed)
  Radiation Oncology         [phone] ________________________________  Name: [name] MRN: [id number]  Date: 01/19/2012  DOB: 1941/04/30  End of Treatment Note  Diagnosis:   Metastatic non-small cell lung cancer     Indication for treatment:  Postop to the right femur and painful osseous metastasis involving the right iliac bone       Radiation treatment dates:   12/04/2011 through July 15 20 13   Site/dose:   Right femur 3000 cGy in 10 fractions                       Right iliac bone 3500 cGy in 14 fractions   Beams/energy:   The right femur area was treated with an AP PA beam arrangement. The right iliac bone area was treated with a right anterior oblique left posterior oblique and right lateral field  Narrative: The patient tolerated radiation treatment relatively well.   She however did not receive any significant improvement in her pain all on the last week of treatment.  Plan: The patient has completed radiation treatment. The patient will return to radiation oncology clinic for routine followup in one month. I advised them to call or return sooner if they have any questions or concerns related to their recovery or treatment.  -----------------------------------  Billie Lade, PhD, MD

## 2012-02-02 ENCOUNTER — Ambulatory Visit (HOSPITAL_BASED_OUTPATIENT_CLINIC_OR_DEPARTMENT_OTHER): Payer: No Typology Code available for payment source | Admitting: Physician Assistant

## 2012-02-02 ENCOUNTER — Other Ambulatory Visit (HOSPITAL_BASED_OUTPATIENT_CLINIC_OR_DEPARTMENT_OTHER): Payer: No Typology Code available for payment source | Admitting: Lab

## 2012-02-02 ENCOUNTER — Telehealth: Payer: Self-pay | Admitting: Internal Medicine

## 2012-02-02 VITALS — BP 133/66 | HR 69 | Temp 97.0°F | Ht 64.0 in | Wt 116.9 lb

## 2012-02-02 DIAGNOSIS — C3490 Malignant neoplasm of unspecified part of unspecified bronchus or lung: Secondary | ICD-10-CM

## 2012-02-02 DIAGNOSIS — I2699 Other pulmonary embolism without acute cor pulmonale: Secondary | ICD-10-CM

## 2012-02-02 DIAGNOSIS — C3410 Malignant neoplasm of upper lobe, unspecified bronchus or lung: Secondary | ICD-10-CM

## 2012-02-02 DIAGNOSIS — M949 Disorder of cartilage, unspecified: Secondary | ICD-10-CM

## 2012-02-02 LAB — CBC WITH DIFFERENTIAL/PLATELET
Basophils Absolute: 0.1 10*3/uL (ref 0.0–0.1)
Eosinophils Absolute: 0.2 10*3/uL (ref 0.0–0.5)
LYMPH%: 16 % (ref 14.0–49.7)
MCV: 92.9 fL (ref 79.5–101.0)
MONO%: 9.8 % (ref 0.0–14.0)
NEUT#: 3.9 10*3/uL (ref 1.5–6.5)
Platelets: 215 10*3/uL (ref 145–400)
RBC: 4 10*6/uL (ref 3.70–5.45)

## 2012-02-02 LAB — COMPREHENSIVE METABOLIC PANEL
Alkaline Phosphatase: 84 U/L (ref 39–117)
BUN: 8 mg/dL (ref 6–23)
Glucose, Bld: 148 mg/dL — ABNORMAL HIGH (ref 70–99)
Sodium: 140 mEq/L (ref 135–145)
Total Bilirubin: 1.1 mg/dL (ref 0.3–1.2)

## 2012-02-02 NOTE — Telephone Encounter (Signed)
appts made and printed for pt aom °

## 2012-02-04 NOTE — Progress Notes (Signed)
[facility] [phone]   [phone]  OFFICE PROGRESS NOTE  Wendy Puffer, MD [address] [place] [place] [address]  DIAGNOSIS: Recurrent non-small cell lung cancer, adenocarcinoma, initially diagnosed with synchronous stage I involving the right upper lobe and left upper lobe in January 2007.   PRIOR THERAPY:  1. Status post bilateral stereotactic radiotherapy under the care of Dr. Abelardo Diesel at [facility] completed Nov 25, 2005. 2. Status post 6 cycles of systemic chemotherapy with carboplatin and paclitaxel for disease recurrence. Last dose given August 27, 2006 discontinued as the patient had disease stabilization. 3. Status post 6 more cycles of systemic chemotherapy for disease progression with carboplatin and paclitaxel, last dose given July 22, 2010 when the patient had disease stabilization and the treatment was discontinued at that time. 4. Systemic chemotherapy with Alimta at 500 mg/m2 given every 3 weeks status post 9 cycles.  CURRENT THERAPY:  1. Palliative radiotherapy to the metastatic lesion at the right proximal femur. 2. Tarceva 150 mg by mouth daily-status post approximately 2 weeks of therapy  CODE STATUS: No CODE BLUE  INTERVAL HISTORY: [name] 71 y.o. female returns to the clinic today for followup visit accompanied by her niece. 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 She was started on treatment with subcutaneous Lovenox followed by her primary care physician. She complains of multiple "knots" on her skin from Lovenox injections. She's not had an injection of Lovenox in the past 5 days due to discomfort from these knots. She continues to have pain that is well-controlled on OxyContin at bedtime and an average of 3 short acting pain pills throughout the day.The patient continues to complain of fatigue and weakness.   She has shortness breath with exertion. No significant nausea or vomiting.   MEDICAL HISTORY: Past Medical History  Diagnosis Date  . Hypertension   . Heart murmur   . Hip fracture, left   . COPD (chronic obstructive pulmonary disease)   . Skin cancer   . Lung cancer 09/18/2005    rul =[facility] dx  . Cancer 11/01/11     mets rightfemur//iliac 5.6cm,&left iliac  . Skin cancer   . 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 Scoliosis 11/01/11    l3  . History of chemotherapy     6 cycles carboplatin/paclitaxel,last dose 08/2006,again given 07/22/2010,alimta q 3weeks s/p 9 cycless  . History of radiation therapy 08/10/2007-09/07/2007    right paratracheal area/ Dr.Kinard, [place]  . History of radiation therapy 2007    bilateral sterotatic radiotherapy Dr[facility]McMullen at [facility] completed 11/25/2005  . Anemia     ALLERGIES:  is allergic to other.  MEDICATIONS:  Current Outpatient Prescriptions  Medication Sig Dispense Refill  . bisoprolol (ZEBETA) 5 MG tablet Take 2.5 mg by mouth daily.       Marland Kitchen dexamethasone (DECADRON) 4 MG tablet Take 4 mg by mouth as directed. Take 1 tablet twice daily the day before,the day of and the day after chemo      . enoxaparin (LOVENOX) 60 MG/0.6ML injection Inject 0.55 mLs (55 mg total) into the skin every 12 (twelve) hours.  10 mL  1  . erlotinib (TARCEVA) 150 MG tablet Take 1 tablet (150 mg total) by mouth daily. Take on an empty stomach 1 hour before meals or 2 hours after.  30 tablet  2  . folic acid (FOLVITE) 1 MG tablet Take 1 mg by mouth daily.        . furosemide (LASIX) 20 MG tablet Take 1 tablet (20 mg total) by mouth 2 (two) times daily. For edema  30 tablet  0  . lisinopril (PRINIVIL,ZESTRIL) 20 MG tablet Take 20 mg by mouth daily.        Marland Kitchen oxyCODONE (OXYCONTIN) 15 MG TB12 Take 1 tablet (15 mg total) by mouth every 12 (twelve) hours.  30 tablet  0  . oxyCODONE-acetaminophen (PERCOCET) 5-325 MG per tablet Take 1 tablet by mouth every  4 (four) hours as needed. take 1-2 every 4-6hrs      . potassium chloride SA (K-DUR,KLOR-CON) 20 MEQ tablet Take 1 tablet (20 mEq total) by mouth daily. X 7 days  7 tablet  0  . tiotropium (SPIRIVA) 18 MCG inhalation capsule Place 18 mcg into inhaler and inhale daily.       No current facility-administered medications for this visit.   Facility-Administered Medications Ordered in Other Visits  Medication Dose Route Frequency Provider Last Rate Last Dose  . heparin lock flush 100 unit/mL  500 Units Intravenous Once Si Gaul, MD      . sodium chloride 0.9 % injection 10 mL  10 mL Intravenous PRN Si Gaul, MD        SURGICAL HISTORY:  Past Surgical History  Procedure Date  . Portacath placement   . Hip surgery     LEFT HIP  . Femur im nail 11/13/2011    Procedure: INTRAMEDULLARY (IM) NAIL FEMORAL;  Surgeon: Senaida Lange, MD;  Location: [facility] OR;  Service: Orthopedics;  Laterality: Right;  IM NAILING OF RIGHT HIP    REVIEW OF SYSTEMS:  A comprehensive review of systems was negative except for: Constitutional: positive for anorexia, fatigue and weight loss Respiratory: positive for cough and dyspnea on exertion Musculoskeletal: positive for bone pain and muscle weakness   PHYSICAL EXAMINATION: General appearance: alert, cooperative and no distress Neck: no adenopathy Lymph nodes: Cervical, supraclavicular, and axillary nodes normal. Resp: clear to auscultation bilaterally Cardio: regular rate and rhythm, S1, S2 normal, no murmur, click, rub or gallop GI: soft, non-tender; bowel sounds normal; no masses,  no organomegaly and Scattered subcutaneous nodules on the lateral aspects of the abdomen, likely secondary to injection technique of the Lovenox Extremities: extremities normal, atraumatic, no cyanosis or edema Neurologic: Alert and oriented X 3, normal strength and tone. Normal symmetric reflexes. Normal coordination and gait  ECOG PERFORMANCE STATUS: 1 - Symptomatic but  completely ambulatory  Blood pressure 133/66, pulse 69, temperature 97 F (36.1 C), temperature source Oral, height 5\' 4"  (1.626 m), weight 116 lb 14.4 oz (53.025 kg).  LABORATORY DATA: Lab Results  Component Value Date   WBC 5.6 02/02/2012   HGB 12.5 02/02/2012   HCT 37.1 02/02/2012   MCV 92.9 02/02/2012   PLT 215 02/02/2012      Chemistry      Component Value Date/Time   NA 140 02/02/2012 1358   NA 144 08/21/2011 1131   K 2.9* 02/02/2012 1358   K 5.1* 08/21/2011 1131   CL 91* 02/02/2012 1358   CL 94* 08/21/2011 1131   CO2 41* 02/02/2012 1358   CO2 33 08/21/2011 1131   BUN 8 02/02/2012 1358   BUN 14 08/21/2011 1131   CREATININE 0.50 02/02/2012 1358   CREATININE 0.5* 08/21/2011 1131      Component Value Date/Time   CALCIUM 10.0 02/02/2012  1358   CALCIUM 9.7 08/21/2011 1131   ALKPHOS 84 02/02/2012 1358   ALKPHOS 108* 08/21/2011 1131   AST 17 02/02/2012 1358   AST 23 08/21/2011 1131   ALT 8 02/02/2012 1358   BILITOT 1.1 02/02/2012 1358   BILITOT 0.70 08/21/2011 1131       RADIOGRAPHIC STUDIES: Dg Chest 1 View  12/07/2011  *RADIOLOGY REPORT*  Clinical Data: Fall.  History of lung cancer.  CHEST - 1 VIEW  Comparison: Chest 11/11/2011.  The CT chest 11/26/2011.  Findings: Cardiac enlargement.  Normal pulmonary vascularity. Emphysematous changes in the lungs with scattered fibrosis.  Focal scarring in the right upper lung may represent postradiation change.  Spiculated appearing nodular opacities in both hilar regions may be related to scarring but metastasis is not excluded. No focal airspace consolidation.  No blunting of costophrenic angles.  No pneumothorax.  Degenerative changes and scoliosis of the spine.  Calcification of the aorta.  Right power port type central venous catheter with tip over the mid SVC region.  IMPRESSION: Cardiac enlargement.  Emphysema and fibrosis in the lungs. Scarring bilaterally.  Spiculated appearing nodular opacities in both hilar regions.  No active infiltration.   Original Report Authenticated By: Marlon Pel, M.D.   Dg Chest 2 View  12/11/2011  *RADIOLOGY REPORT*  Clinical Data: Shortness of breath, cough and congestion.  Hypoxia.  CHEST - 2 VIEW  Comparison: CT chest and chest radiograph 12/07/2011.  Findings: Trachea is midline.  Heart is enlarged.  Thoracic aorta is calcified.  Right IJ Port-A-Cath tip projects over the SVC.  Linear densities are seen in the perihilar regions bilaterally, with confluence at the hila.  Minimal streaky density at the lung bases bilaterally.  Tiny bilateral pleural effusions. Biapical pleural thickening.  IMPRESSION:  1.  Linear densities in the perihilar regions with confluence of the hilum, as on 12/07/2011. Scattered pulmonary nodules are better seen on 12/07/2011. 2.  Minimal bibasilar atelectasis and tiny bilateral pleural effusions.  Original Report Authenticated By: Reyes Ivan, M.D.   Dg Femur Right  12/07/2011  *RADIOLOGY REPORT*  Clinical Data: Pain after fall.  RIGHT FEMUR - 2 VIEW  Comparison: 11/13/2011  Findings: Postoperative changes in the right hip with intramedullary rod and screw and compression screw fixation.  No evidence of acute fracture or displacement.  No focal bone lesion or bone destruction.  Bone cortex and trabecular architecture appear intact.  Vascular calcifications.  Degenerative changes in the knee.  No significant change in positioning of hardware since previous study.  IMPRESSION: Postoperative changes with internal fixation of the proximal right femur.  No acute bony abnormalities identified.  Original Report Authenticated By: Marlon Pel, M.D.   Ct Head Without Contrast  12/07/2011  *RADIOLOGY REPORT*  Clinical Data: Right arm weakness  CT HEAD WITHOUT CONTRAST  Technique:  Contiguous axial images were obtained from the base of the skull through the vertex without contrast.  Comparison: 08/05/2010  Findings: Stable mild brain atrophy most pronounced in the frontal lobes.  No  acute intracranial hemorrhage, definite mass lesion, infarction, midline shift, herniation, or hydrocephalus.  Gray- white matter differentiation maintained.  Cisterns patent.  No cerebellar abnormality.  Visualized mastoids and sinuses clear.  IMPRESSION: Stable exam.  No acute intracranial process by noncontrast CT  Original Report Authenticated By: Judie Petit. Ruel Favors, M.D.   Ct Angio Chest W/cm &/or Wo Cm  12/07/2011  **ADDENDUM** CREATED: 12/07/2011 05:40:23  The esophagus is mildly distended with an air-fluid level suggesting  dysmotility.  **END ADDENDUM** SIGNED BY: Marlon Pel, M.D.   12/07/2011  *RADIOLOGY REPORT*  Clinical Data: Short of breath.  History of lung cancer and COPD. Fall.  CT ANGIOGRAPHY CHEST  Technique:  Multidetector CT imaging of the chest using the standard protocol during bolus administration of intravenous contrast. Multiplanar reconstructed images including MIPs were obtained and reviewed to evaluate the vascular anatomy.  Contrast: OMNIPAQUE IOHEXOL 350 MG/ML SOLN  Comparison: 11/26/2011  Findings: Technically adequate study with good opacification of the central and segmental pulmonary arteries.  There are focal intraluminal filling defects in the scattered bilateral segmental branches consistent with pulmonary emboli.  Cardiac enlargement.  Calcification of the aortic valve and coronary arteries.  Calcification and ectasia of the aorta. Scarring in the mid lungs bilaterally with mass or adenopathy in the right hilum measuring about 15 mm diameter.  Pretracheal lymphadenopathy measures 12 mm short axis dimension.  Multiple pulmonary nodules are again demonstrated bilaterally consistent with metastatic disease.  Probably the largest is a spiculated nodule in the right upper lung measuring 8 mm diameter. Visualization of the lungs is limited due to respiratory motion artifact.  Diffuse emphysematous changes and fibrosis throughout the lungs.  Atelectasis in the lung bases.   Focal wedge-shaped consolidation in the right lung base posteriorly may represent a focal infarct.  Bronchiectasis and mucus plugging in the lung bases with peribronchial thickening consistent with chronic bronchitis. Bilateral adrenal gland nodules measuring 2.6 cm on the left and 3.3 cm on the right.  These demonstrate low density measurements suggesting adenomas.  Degenerative changes in the thoracic spine. No destructive bone lesions appreciated. Minimal right pleural effusion.  IMPRESSION: Positive study for multiple bilateral segmental pulmonary emboli. Possible small peripheral infarct in the right lung base posteriorly.  Again demonstrated are multiple diffuse bilateral pulmonary metastases, right hilar mass, diffuse emphysematous changes, and fibrosis. Chronic bronchitic changes.  Critical Value/emergent results were called by telephone at the time of interpretation on 12/07/2011  at 0440 hours  to  Dr. Hyacinth Meeker, who verbally acknowledged these results. Cardiac enlargement.  Bilateral adrenal gland nodules.  Original Report Authenticated By: Marlon Pel, M.D.   Mr Laqueta Jean Wo Contrast  12/07/2011  *RADIOLOGY REPORT*  Clinical Data: 71 year old female with metastatic cancer.  Right upper extremity weakness.  MRI HEAD WITHOUT AND WITH CONTRAST  Technique:  Multiplanar, multiecho pulse sequences of the brain and surrounding structures were obtained according to standard protocol without and with intravenous contrast  Contrast: 10mL MULTIHANCE GADOBENATE DIMEGLUMINE 529 MG/ML IV SOLN  Comparison: Head CTs 12/07/2011 and earlier.  Findings: Multiple small foci of restricted diffusion scattered in the right hemisphere, including the frontal lobe (left upper extremity motor cortex regions series 4 image 18), parietal lobe, and also a fairly dense involvement in the left occipital pole (series 4 image 7).  Additionally, there is a wedge-shaped infarct with restricted diffusion in the left cerebellum (image  4).  Also, there are two suspicious foci of possible diffusion restriction in the right hemisphere, pre motor cortex on image 20 and possible focal right occipital lobe cortex involvement on image 80.  None of these areas show evidence of hemorrhage or mass effect. Major intracranial vascular flow voids are preserved.  No ventriculomegaly.  No midline shift.  No abnormal enhancement or mass identified in the brain.  Chronic lacunar infarcts occasionally noted in the deep gray matter nuclei and cerebellum.  Overall normal cerebral volume. Negative pituitary and cervicomedullary junction.  Visualized bone marrow signal  is within normal limits. Degenerative changes and cervical spine. Visualized orbit soft tissues are within normal limits.  Visualized paranasal sinuses and mastoids are clear.  Negative scalp soft tissues.  IMPRESSION: 1.  Multiple scattered small acute infarcts in the left hemisphere involving both the MCA and PCA territories.  Left upper extremity motor cortex region is affected. 2.  Small wedge-shaped acute infarct also in the left cerebellum. 3.  Suggestion of several punctate infarcts also in the right hemisphere.  This constellation raise the possibility of recent embolic event. 4.  No mass effect or hemorrhage.  Mild for age underlying chronic small vessel ischemia. 5.  No metastatic disease identified.  Original Report Authenticated By: Harley Hallmark, M.D.    ASSESSMENT/PLAN: This is a very pleasant 71 years old white female with metastatic non-small cell lung cancer and recently diagnosed bilateral pulmonary emboli as well as stroke. She is currently receiving palliative radiotherapy to the right femur. The patient was discussed with Dr. Arbutus Ped. She'll continue on Tarceva at 150 mg by mouth daily. She'll return in 2 weeks for another symptom management visit with a repeat CBC differential and C. met. Injection technique of the Lovenox injections was reviewed again with the knees and Ms.  Verhagen by our injection nurse. Both patient and her niece voiced understanding of the injection instructions.  She'll continue on subcutaneous Lovenox for her bilateral pulmonary emboli and followup with her primary care physician as he is monitoring this therapy.   Laural Benes, Jamall Strohmeier E, PA-C   All questions were answered. The patient knows to call the clinic with any problems, questions or concerns. We can certainly see the patient much sooner if necessary.  I spent 20 minutes counseling the patient face to face. The total time spent in the appointment was 30 minutes.

## 2012-02-09 ENCOUNTER — Telehealth: Payer: Self-pay | Admitting: Medical Oncology

## 2012-02-09 DIAGNOSIS — E876 Hypokalemia: Secondary | ICD-10-CM

## 2012-02-09 MED ORDER — POTASSIUM CHLORIDE CRYS ER 20 MEQ PO TBCR: 40.0000 meq | EXTENDED_RELEASE_TABLET | Freq: Every day | ORAL | Status: DC

## 2012-02-09 NOTE — Telephone Encounter (Signed)
Called in potassium to pharmacy and pt

## 2012-02-09 NOTE — Telephone Encounter (Signed)
Message copied by Charma Igo on Mon Feb 09, 2012 12:39 PM ------      Message from: Tiana Loft E      Created: Mon Feb 09, 2012 11:42 AM       Abnormal results, please call in following prescription and notify patient        KCL 40 meq po daily for 7 days

## 2012-02-11 ENCOUNTER — Telehealth: Payer: Self-pay | Admitting: *Deleted

## 2012-02-11 NOTE — Telephone Encounter (Signed)
Rec'd fax from [employer] that Tarceva shipped 02/10/12.

## 2012-02-18 ENCOUNTER — Ambulatory Visit (HOSPITAL_BASED_OUTPATIENT_CLINIC_OR_DEPARTMENT_OTHER): Payer: No Typology Code available for payment source | Admitting: Physician Assistant

## 2012-02-18 ENCOUNTER — Telehealth: Payer: Self-pay | Admitting: Internal Medicine

## 2012-02-18 ENCOUNTER — Other Ambulatory Visit: Payer: No Typology Code available for payment source | Admitting: Lab

## 2012-02-18 ENCOUNTER — Encounter: Payer: Self-pay | Admitting: Physician Assistant

## 2012-02-18 ENCOUNTER — Other Ambulatory Visit: Payer: Self-pay | Admitting: Pharmacist

## 2012-02-18 VITALS — BP 138/70 | HR 61 | Temp 98.1°F | Resp 18 | Ht 64.0 in | Wt 116.5 lb

## 2012-02-18 DIAGNOSIS — C3490 Malignant neoplasm of unspecified part of unspecified bronchus or lung: Secondary | ICD-10-CM

## 2012-02-18 DIAGNOSIS — I2699 Other pulmonary embolism without acute cor pulmonale: Secondary | ICD-10-CM

## 2012-02-18 DIAGNOSIS — R0602 Shortness of breath: Secondary | ICD-10-CM

## 2012-02-18 LAB — COMPREHENSIVE METABOLIC PANEL
Alkaline Phosphatase: 103 U/L (ref 39–117)
Creatinine, Ser: 0.46 mg/dL — ABNORMAL LOW (ref 0.50–1.10)
Glucose, Bld: 100 mg/dL — ABNORMAL HIGH (ref 70–99)
Sodium: 140 mEq/L (ref 135–145)
Total Bilirubin: 1.2 mg/dL (ref 0.3–1.2)
Total Protein: 6.3 g/dL (ref 6.0–8.3)

## 2012-02-18 LAB — CBC WITH DIFFERENTIAL/PLATELET
Eosinophils Absolute: 0.2 10*3/uL (ref 0.0–0.5)
LYMPH%: 17.2 % (ref 14.0–49.7)
MCHC: 33 g/dL (ref 31.5–36.0)
MCV: 93.1 fL (ref 79.5–101.0)
MONO%: 9.9 % (ref 0.0–14.0)
Platelets: 209 10*3/uL (ref 145–400)
RBC: 3.82 10*6/uL (ref 3.70–5.45)

## 2012-02-18 MED ORDER — WARFARIN SODIUM 5 MG PO TABS: ORAL_TABLET | ORAL | Status: DC

## 2012-02-18 NOTE — Progress Notes (Signed)
[facility] Telephone:(336) [phone]   Fax:(336) [phone]  OFFICE PROGRESS NOTE  [name], MD [address] [place] [address]  DIAGNOSIS: Recurrent non-small cell lung cancer, adenocarcinoma, initially diagnosed with synchronous stage I involving the right upper lobe and left upper lobe in January 2007.   PRIOR THERAPY:  1. Status post bilateral stereotactic radiotherapy under the care of Dr. Abelardo Diesel at [facility] completed Nov 25, 2005. 2. Status post 6 cycles of systemic chemotherapy with carboplatin and paclitaxel for disease recurrence. Last dose given August 27, 2006 discontinued as the patient had disease stabilization. 3. Status post 6 more cycles of systemic chemotherapy for disease progression with carboplatin and paclitaxel, last dose given July 22, 2010 when the patient had disease stabilization and the treatment was discontinued at that time. 4. Systemic chemotherapy with Alimta at 500 mg/m2 given every 3 weeks status post 9 cycles.  CURRENT THERAPY:  1. Palliative radiotherapy to the metastatic lesion at the right proximal femur. 2. Tarceva 150 mg by mouth daily-status post approximately 2 weeks of therapy  CODE STATUS: No CODE BLUE  INTERVAL HISTORY: [name] 71 y.o. female returns to the clinic today for followup visit accompanied by her niece. 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 She was started on treatment with subcutaneous Lovenox followed by her primary care physician. She continues to  complain of multiple "knots" on her skin from Lovenox injections. She again has requesting changed to Coumadin tablets for the treatment of her pulmonary emboli. She continues to have pain that is well-controlled on OxyContin at bedtime and an average of 3 short acting pain pills throughout the day. She complains of fatigue and weakness.   She has shortness breath with exertion. No significant nausea or vomiting or diarrhea.   MEDICAL HISTORY: Past Medical History  Diagnosis Date  . Hypertension   . Heart murmur   . Hip fracture, left   . COPD (chronic obstructive pulmonary disease)   . Skin cancer   . Lung cancer 09/18/2005    rul =[facility] dx  . Cancer 11/01/11     mets rightfemur//iliac 5.6cm,&left iliac  . Skin cancer   . 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 Scoliosis 11/01/11    l3  . History of chemotherapy     6 cycles carboplatin/paclitaxel,last dose 08/2006,again given 07/22/2010,alimta q 3weeks s/p 9 cycless  . History of radiation therapy 08/10/2007-09/07/2007    right paratracheal area/ Dr.Kinard, [place]  . History of radiation therapy 2007    bilateral sterotatic radiotherapy Dr.McMullen at [facility] completed 11/25/2005  . Anemia     ALLERGIES:  is allergic to other.  MEDICATIONS:  Current Outpatient Prescriptions  Medication Sig Dispense Refill  . bisoprolol (ZEBETA) 5 MG tablet Take 2.5 mg by mouth daily.       Marland Kitchen dexamethasone (DECADRON) 4 MG tablet Take 4 mg by mouth as directed. Take 1 tablet twice daily the day before,the day of and the day after chemo      . enoxaparin (LOVENOX) 60 MG/0.6ML injection Inject 0.55 mLs (55 mg total) into the skin every 12 (twelve) hours.  10 mL  1  . folic acid (FOLVITE) 1 MG tablet Take 1 mg by mouth daily.        . furosemide (LASIX) 20 MG tablet Take 1 tablet (20 mg  total) by mouth 2 (two) times daily. For edema  30 tablet  0  . lisinopril (PRINIVIL,ZESTRIL) 20 MG tablet Take 20 mg by mouth daily.        Marland Kitchen oxyCODONE (OXYCONTIN) 15 MG TB12 Take 1 tablet (15 mg total) by mouth every 12 (twelve) hours.  30 tablet  0  . oxyCODONE-acetaminophen (PERCOCET) 5-325 MG per tablet Take 1 tablet by mouth every 4 (four) hours as needed. take 1-2 every 4-6hrs      . potassium chloride SA (K-DUR,KLOR-CON) 20 MEQ tablet Take 2 tablets (40 mEq total) by mouth  daily. X 7 days  7 tablet  0  . tiotropium (SPIRIVA) 18 MCG inhalation capsule Place 18 mcg into inhaler and inhale daily.      Marland Kitchen warfarin (COUMADIN) 5 MG tablet Take one tablet by mouth daily between 4:00 and 6:00 pm, or as directed  50 tablet  0   No current facility-administered medications for this visit.   Facility-Administered Medications Ordered in Other Visits  Medication Dose Route Frequency Provider Last Rate Last Dose  . heparin lock flush 100 unit/mL  500 Units Intravenous Once Si Gaul, MD      . sodium chloride 0.9 % injection 10 mL  10 mL Intravenous PRN Si Gaul, MD        SURGICAL HISTORY:  Past Surgical History  Procedure Date  . Portacath placement   . Hip surgery     LEFT HIP  . Femur im nail 11/13/2011    Procedure: INTRAMEDULLARY (IM) NAIL FEMORAL;  Surgeon: Senaida Lange, MD;  Location: [facility] OR;  Service: Orthopedics;  Laterality: Right;  IM NAILING OF RIGHT HIP    REVIEW OF SYSTEMS:  A comprehensive review of systems was negative except for: Constitutional: positive for anorexia, fatigue and weight loss Respiratory: positive for cough and dyspnea on exertion Musculoskeletal: positive for bone pain and muscle weakness   PHYSICAL EXAMINATION: General appearance: alert, cooperative and no distress Neck: no adenopathy Lymph nodes: Cervical, supraclavicular, and axillary nodes normal. Resp: clear to auscultation bilaterally Cardio: regular rate and rhythm, S1, S2 normal, no murmur, click, rub or gallop GI: soft, non-tender; bowel sounds normal; no masses,  no organomegaly and Scattered subcutaneous nodules on the lateral aspects of the abdomen, likely secondary to injection technique of the Lovenox Extremities: extremities normal, atraumatic, no cyanosis or edema Neurologic: Alert and oriented X 3, normal strength and tone. Normal symmetric reflexes. Normal coordination and gait  ECOG PERFORMANCE STATUS: 1 - Symptomatic but completely  ambulatory  Blood pressure 138/70, pulse 61, temperature 98.1 F (36.7 C), temperature source Oral, resp. rate 18, height 5\' 4"  (1.626 m), weight 116 lb 8 oz (52.844 kg).  LABORATORY DATA: Lab Results  Component Value Date   WBC 4.5 02/18/2012   HGB 11.7 02/18/2012   HCT 35.6 02/18/2012   MCV 93.1 02/18/2012   PLT 209 02/18/2012      Chemistry      Component Value Date/Time   NA 140 02/02/2012 1358   NA 144 08/21/2011 1131   K 2.9* 02/02/2012 1358   K 5.1* 08/21/2011 1131   CL 91* 02/02/2012 1358   CL 94* 08/21/2011 1131   CO2 41* 02/02/2012 1358   CO2 33 08/21/2011 1131   BUN 8 02/02/2012 1358   BUN 14 08/21/2011 1131   CREATININE 0.50 02/02/2012 1358   CREATININE 0.5* 08/21/2011 1131      Component Value Date/Time   CALCIUM 10.0 02/02/2012 1358   CALCIUM  9.7 08/21/2011 1131   ALKPHOS 84 02/02/2012 1358   ALKPHOS 108* 08/21/2011 1131   AST 17 02/02/2012 1358   AST 23 08/21/2011 1131   ALT 8 02/02/2012 1358   BILITOT 1.1 02/02/2012 1358   BILITOT 0.70 08/21/2011 1131       RADIOGRAPHIC STUDIES: Dg Chest 1 View  12/07/2011  *RADIOLOGY REPORT*  Clinical Data: Fall.  History of lung cancer.  CHEST - 1 VIEW  Comparison: Chest 11/11/2011.  The CT chest 11/26/2011.  Findings: Cardiac enlargement.  Normal pulmonary vascularity. Emphysematous changes in the lungs with scattered fibrosis.  Focal scarring in the right upper lung may represent postradiation change.  Spiculated appearing nodular opacities in both hilar regions may be related to scarring but metastasis is not excluded. No focal airspace consolidation.  No blunting of costophrenic angles.  No pneumothorax.  Degenerative changes and scoliosis of the spine.  Calcification of the aorta.  Right power port type central venous catheter with tip over the mid SVC region.  IMPRESSION: Cardiac enlargement.  Emphysema and fibrosis in the lungs. Scarring bilaterally.  Spiculated appearing nodular opacities in both hilar regions.  No active infiltration.   Original Report Authenticated By: Marlon Pel, M.D.   Dg Chest 2 View  12/11/2011  *RADIOLOGY REPORT*  Clinical Data: Shortness of breath, cough and congestion.  Hypoxia.  CHEST - 2 VIEW  Comparison: CT chest and chest radiograph 12/07/2011.  Findings: Trachea is midline.  Heart is enlarged.  Thoracic aorta is calcified.  Right IJ Port-A-Cath tip projects over the SVC.  Linear densities are seen in the perihilar regions bilaterally, with confluence at the hila.  Minimal streaky density at the lung bases bilaterally.  Tiny bilateral pleural effusions. Biapical pleural thickening.  IMPRESSION:  1.  Linear densities in the perihilar regions with confluence of the hilum, as on 12/07/2011. Scattered pulmonary nodules are better seen on 12/07/2011. 2.  Minimal bibasilar atelectasis and tiny bilateral pleural effusions.  Original Report Authenticated By: Reyes Ivan, M.D.   Dg Femur Right  12/07/2011  *RADIOLOGY REPORT*  Clinical Data: Pain after fall.  RIGHT FEMUR - 2 VIEW  Comparison: 11/13/2011  Findings: Postoperative changes in the right hip with intramedullary rod and screw and compression screw fixation.  No evidence of acute fracture or displacement.  No focal bone lesion or bone destruction.  Bone cortex and trabecular architecture appear intact.  Vascular calcifications.  Degenerative changes in the knee.  No significant change in positioning of hardware since previous study.  IMPRESSION: Postoperative changes with internal fixation of the proximal right femur.  No acute bony abnormalities identified.  Original Report Authenticated By: Marlon Pel, M.D.   Ct Head Without Contrast  12/07/2011  *RADIOLOGY REPORT*  Clinical Data: Right arm weakness  CT HEAD WITHOUT CONTRAST  Technique:  Contiguous axial images were obtained from the base of the skull through the vertex without contrast.  Comparison: 08/05/2010  Findings: Stable mild brain atrophy most pronounced in the frontal lobes.  No  acute intracranial hemorrhage, definite mass lesion, infarction, midline shift, herniation, or hydrocephalus.  Gray- white matter differentiation maintained.  Cisterns patent.  No cerebellar abnormality.  Visualized mastoids and sinuses clear.  IMPRESSION: Stable exam.  No acute intracranial process by noncontrast CT  Original Report Authenticated By: Judie Petit. Ruel Favors, M.D.   Ct Angio Chest W/cm &/or Wo Cm  12/07/2011  **ADDENDUM** CREATED: 12/07/2011 05:40:23  The esophagus is mildly distended with an air-fluid level suggesting dysmotility.  **END ADDENDUM**  SIGNED BY: Marlon Pel, M.D.   12/07/2011  *RADIOLOGY REPORT*  Clinical Data: Short of breath.  History of lung cancer and COPD. Fall.  CT ANGIOGRAPHY CHEST  Technique:  Multidetector CT imaging of the chest using the standard protocol during bolus administration of intravenous contrast. Multiplanar reconstructed images including MIPs were obtained and reviewed to evaluate the vascular anatomy.  Contrast: OMNIPAQUE IOHEXOL 350 MG/ML SOLN  Comparison: 11/26/2011  Findings: Technically adequate study with good opacification of the central and segmental pulmonary arteries.  There are focal intraluminal filling defects in the scattered bilateral segmental branches consistent with pulmonary emboli.  Cardiac enlargement.  Calcification of the aortic valve and coronary arteries.  Calcification and ectasia of the aorta. Scarring in the mid lungs bilaterally with mass or adenopathy in the right hilum measuring about 15 mm diameter.  Pretracheal lymphadenopathy measures 12 mm short axis dimension.  Multiple pulmonary nodules are again demonstrated bilaterally consistent with metastatic disease.  Probably the largest is a spiculated nodule in the right upper lung measuring 8 mm diameter. Visualization of the lungs is limited due to respiratory motion artifact.  Diffuse emphysematous changes and fibrosis throughout the lungs.  Atelectasis in the lung bases.   Focal wedge-shaped consolidation in the right lung base posteriorly may represent a focal infarct.  Bronchiectasis and mucus plugging in the lung bases with peribronchial thickening consistent with chronic bronchitis. Bilateral adrenal gland nodules measuring 2.6 cm on the left and 3.3 cm on the right.  These demonstrate low density measurements suggesting adenomas.  Degenerative changes in the thoracic spine. No destructive bone lesions appreciated. Minimal right pleural effusion.  IMPRESSION: Positive study for multiple bilateral segmental pulmonary emboli. Possible small peripheral infarct in the right lung base posteriorly.  Again demonstrated are multiple diffuse bilateral pulmonary metastases, right hilar mass, diffuse emphysematous changes, and fibrosis. Chronic bronchitic changes.  Critical Value/emergent results were called by telephone at the time of interpretation on 12/07/2011  at 0440 hours  to  Dr. Hyacinth Meeker, who verbally acknowledged these results. Cardiac enlargement.  Bilateral adrenal gland nodules.  Original Report Authenticated By: Marlon Pel, M.D.   Mr Laqueta Jean Wo Contrast  12/07/2011  *RADIOLOGY REPORT*  Clinical Data: 71 year old female with metastatic cancer.  Right upper extremity weakness.  MRI HEAD WITHOUT AND WITH CONTRAST  Technique:  Multiplanar, multiecho pulse sequences of the brain and surrounding structures were obtained according to standard protocol without and with intravenous contrast  Contrast: 10mL MULTIHANCE GADOBENATE DIMEGLUMINE 529 MG/ML IV SOLN  Comparison: Head CTs 12/07/2011 and earlier.  Findings: Multiple small foci of restricted diffusion scattered in the right hemisphere, including the frontal lobe (left upper extremity motor cortex regions series 4 image 18), parietal lobe, and also a fairly dense involvement in the left occipital pole (series 4 image 7).  Additionally, there is a wedge-shaped infarct with restricted diffusion in the left cerebellum (image  4).  Also, there are two suspicious foci of possible diffusion restriction in the right hemisphere, pre motor cortex on image 20 and possible focal right occipital lobe cortex involvement on image 80.  None of these areas show evidence of hemorrhage or mass effect. Major intracranial vascular flow voids are preserved.  No ventriculomegaly.  No midline shift.  No abnormal enhancement or mass identified in the brain.  Chronic lacunar infarcts occasionally noted in the deep gray matter nuclei and cerebellum.  Overall normal cerebral volume. Negative pituitary and cervicomedullary junction.  Visualized bone marrow signal is within normal limits.  Degenerative changes and cervical spine. Visualized orbit soft tissues are within normal limits.  Visualized paranasal sinuses and mastoids are clear.  Negative scalp soft tissues.  IMPRESSION: 1.  Multiple scattered small acute infarcts in the left hemisphere involving both the MCA and PCA territories.  Left upper extremity motor cortex region is affected. 2.  Small wedge-shaped acute infarct also in the left cerebellum. 3.  Suggestion of several punctate infarcts also in the right hemisphere.  This constellation raise the possibility of recent embolic event. 4.  No mass effect or hemorrhage.  Mild for age underlying chronic small vessel ischemia. 5.  No metastatic disease identified.  Original Report Authenticated By: Harley Hallmark, M.D.    ASSESSMENT/PLAN: This is a very pleasant 71 years old white female with metastatic non-small cell lung cancer and recently diagnosed bilateral pulmonary emboli as well as stroke. She is currently receiving palliative radiotherapy to the right femur. The patient was discussed with Dr. Arbutus Ped. She'll continue on Tarceva at 150 mg by mouth daily. She'll return in one month for another symptom management visit with a repeat CBC differential and C. met. Regarding treatment for her pulmonary emboli this was also discussed with Dr. Arbutus Ped  and we will transition her over to Coumadin tablets. We'll refer her to our cancer Center Coumadin clinic initially, however if her primary care physician wants to take over her management monitoring and dose adjustment of her Coumadin this will be acceptable as well. She will need a minimum of 6 months of therapy and is already completed about 40 days of subcutaneous Lovenox therapy. A prescription for Coumadin 5 mg tablets one tablet daily between 4 and 6 PM or as directed, total of 50 tablets with no refill was sent her pharmacy of record via [facility]. scribe. She will be seen in followup by the Coumadin clinic on 02/23/2012.   [name], Wendy Droz E, PA-C   All questions were answered. The patient knows to call the clinic with any problems, questions or concerns. We can certainly see the patient much sooner if necessary.  I spent 20 minutes counseling the patient face to face. The total time spent in the appointment was 30 minutes.

## 2012-02-18 NOTE — Telephone Encounter (Signed)
gve the pt her sept 2013 appt calendar °

## 2012-02-23 ENCOUNTER — Ambulatory Visit (HOSPITAL_BASED_OUTPATIENT_CLINIC_OR_DEPARTMENT_OTHER): Payer: No Typology Code available for payment source | Admitting: Pharmacist

## 2012-02-23 ENCOUNTER — Other Ambulatory Visit (HOSPITAL_BASED_OUTPATIENT_CLINIC_OR_DEPARTMENT_OTHER): Payer: No Typology Code available for payment source

## 2012-02-23 DIAGNOSIS — Z7901 Long term (current) use of anticoagulants: Secondary | ICD-10-CM

## 2012-02-23 DIAGNOSIS — I2699 Other pulmonary embolism without acute cor pulmonale: Secondary | ICD-10-CM

## 2012-02-23 LAB — POCT INR: INR: 2.5

## 2012-02-23 LAB — PROTIME-INR: INR: 2.5 (ref 2.00–3.50)

## 2012-02-23 NOTE — Progress Notes (Addendum)
INR at goal.  Instructed pt to stop Lovenox and cont coumadin 5mg  daily.  Will check PT/INR in 1 week.  Pt instructed to call with any concerns. Coumadin education complete last week when asked to see pt by Tiana Loft, PA.

## 2012-02-24 ENCOUNTER — Other Ambulatory Visit: Payer: Self-pay | Admitting: Pharmacist

## 2012-02-24 DIAGNOSIS — I2699 Other pulmonary embolism without acute cor pulmonale: Secondary | ICD-10-CM

## 2012-02-25 ENCOUNTER — Telehealth: Payer: Self-pay | Admitting: *Deleted

## 2012-02-25 NOTE — Telephone Encounter (Signed)
[facility] faxed Tarceva plan of care and services pt. Receiving from [facility].

## 2012-02-29 ENCOUNTER — Encounter: Payer: Self-pay | Admitting: Radiation Oncology

## 2012-03-01 ENCOUNTER — Encounter: Payer: Self-pay | Admitting: Radiation Oncology

## 2012-03-01 ENCOUNTER — Ambulatory Visit
Admission: RE | Admit: 2012-03-01 | Discharge: 2012-03-01 | Disposition: A | Discharge status: Home / Self Care | Payer: No Typology Code available for payment source | Source: Ambulatory Visit | Attending: Radiation Oncology | Admitting: Radiation Oncology

## 2012-03-01 ENCOUNTER — Ambulatory Visit (HOSPITAL_BASED_OUTPATIENT_CLINIC_OR_DEPARTMENT_OTHER): Payer: No Typology Code available for payment source | Admitting: Pharmacist

## 2012-03-01 ENCOUNTER — Other Ambulatory Visit: Payer: No Typology Code available for payment source | Admitting: Lab

## 2012-03-01 VITALS — BP 135/82 | HR 64 | Temp 97.0°F | Resp 20 | Wt 115.8 lb

## 2012-03-01 DIAGNOSIS — C7952 Secondary malignant neoplasm of bone marrow: Secondary | ICD-10-CM

## 2012-03-01 DIAGNOSIS — I2699 Other pulmonary embolism without acute cor pulmonale: Secondary | ICD-10-CM

## 2012-03-01 LAB — PROTIME-INR

## 2012-03-01 NOTE — Progress Notes (Signed)
INR supratherapeutic today (5.98) on 5mg  daily.   Patient has been on Coumadin x 14 days now.  INR elevation likely related to dose of Coumadin being too high for her.  No problems with bleeding or bruising. Recommended that pt switch to a soft bristle toothbrush to help minimize gum bleeding.  Will have pt hold Coumadin x 3 days, and recheck INR on Thursday, 03/04/12.

## 2012-03-01 NOTE — Progress Notes (Signed)
Radiation Oncology         [phone] ________________________________  Name: [name] MRN: [id number]  Date: 03/01/2012  DOB: 1941/05/14  Follow-Up Visit Note  CC: [name], MD  [name], MD  Diagnosis:   Metastatic non-small cell lung cancer  Interval Since Last Radiation:  6 weeks  Narrative:  The patient returns today for routine follow-up.  She continues to have pain in her right upper pelvis and right femur region. According to patient on x-rays through Dr. supple's office patient had developed a fracture along the surgical bed. He was advised the patient not bear weight along her right leg. She did receive postoperative treatments to this area as well as treatments to the right iliac bone area for painful osseous metastasis in this region.     The patient has been started on Tarceva.                         ALLERGIES:  is allergic to other.  Meds: Current Outpatient Prescriptions  Medication Sig Dispense Refill  . bisoprolol (ZEBETA) 5 MG tablet Take 2.5 mg by mouth daily.       . furosemide (LASIX) 20 MG tablet Take 1 tablet (20 mg total) by mouth 2 (two) times daily. For edema  30 tablet  0  . lisinopril (PRINIVIL,ZESTRIL) 20 MG tablet Take 20 mg by mouth daily.        Marland Kitchen oxyCODONE (OXYCONTIN) 15 MG TB12 Take 1 tablet (15 mg total) by mouth every 12 (twelve) hours.  30 tablet  0  . oxyCODONE-acetaminophen (PERCOCET) 5-325 MG per tablet Take 1 tablet by mouth every 4 (four) hours as needed. take 1-2 every 4-6hrs      . tiotropium (SPIRIVA) 18 MCG inhalation capsule Place 18 mcg into inhaler and inhale daily.      Marland Kitchen warfarin (COUMADIN) 5 MG tablet Take one tablet by mouth daily between 4:00 and 6:00 pm, or as directed  50 tablet  0   No current facility-administered medications for this encounter.   Facility-Administered Medications Ordered in Other Encounters  Medication Dose Route Frequency Provider Last Rate Last Dose  . heparin lock flush 100 unit/mL   500 Units Intravenous Once Si Gaul, MD      . sodium chloride 0.9 % injection 10 mL  10 mL Intravenous PRN Si Gaul, MD        Physical Findings: The patient is in no acute distress. Patient is alert and oriented.  weight is 115 lb 12.8 oz (52.527 kg). Her oral temperature is 97 F (36.1 C). Her blood pressure is 135/82 and her pulse is 64. Her respiration is 20 and oxygen saturation is 94%. .  The lungs are clear. The heart has a regular rhythm and rate. Patient is sitting in a wheelchair today. She has some tenderness with palpation the right upper posterior pelvis region as well as her right femur region.  Lab Findings: Lab Results  Component Value Date   WBC 4.5 02/18/2012   HGB 11.7 02/18/2012   HCT 35.6 02/18/2012   MCV 93.1 02/18/2012   PLT 209 02/18/2012    @LASTCHEM @  Radiographic Findings: No results found.  Impression:  The patient is recovering from the effects of radiation.  She unfortunately has not had good improvement in her pain with her palliative radiation therapy. Hopefully with Tarceva she will get some improvement.  Plan:  When necessary followup in light of the patient's close followup  in medical oncology.  _____________________________________    Billie Lade, PhD, MD

## 2012-03-01 NOTE — Progress Notes (Signed)
patient arrived  Via w/c, just came from coumadin clinic made her late, still weak, unsteady gait, here for f/u s/p radiation treatment to right femur and right iliac from 12/04/11-01/19/12 Patient pain a level "6" took her pain meds at 9am, today, states:she doesn't feel any better since radiation, has a knot that swells up every now and then on right groin, cannot life right leg much when sitting down, legs b/l dry,  A little edema  meds updated, 94% room air sats , states eating well, drinking enough fluids states patient 11:43 AM

## 2012-03-04 ENCOUNTER — Other Ambulatory Visit (HOSPITAL_BASED_OUTPATIENT_CLINIC_OR_DEPARTMENT_OTHER): Payer: No Typology Code available for payment source | Admitting: Lab

## 2012-03-04 ENCOUNTER — Ambulatory Visit (HOSPITAL_BASED_OUTPATIENT_CLINIC_OR_DEPARTMENT_OTHER): Payer: No Typology Code available for payment source | Admitting: Pharmacist

## 2012-03-04 DIAGNOSIS — Z5181 Encounter for therapeutic drug level monitoring: Secondary | ICD-10-CM

## 2012-03-04 DIAGNOSIS — Z7901 Long term (current) use of anticoagulants: Secondary | ICD-10-CM

## 2012-03-04 DIAGNOSIS — I2699 Other pulmonary embolism without acute cor pulmonale: Secondary | ICD-10-CM

## 2012-03-04 LAB — PROTIME-INR

## 2012-03-04 NOTE — Progress Notes (Signed)
INR supratherapeutic (4.4) after holding Coumadin x 3 days. No changes in meds or diet.  Only slight nosebleed to report today, which resolved on its own after a couple minutes.  No other significant issues. Will continue to hold Coumadin x 3 days, then resume at lower dose of 2.5mg  daily on Sunday, 03/07/12.  Patient will receive 3 doses before returning for INR check on Wednesday, 03/10/12.

## 2012-03-10 ENCOUNTER — Other Ambulatory Visit (HOSPITAL_BASED_OUTPATIENT_CLINIC_OR_DEPARTMENT_OTHER): Payer: No Typology Code available for payment source | Admitting: Lab

## 2012-03-10 ENCOUNTER — Ambulatory Visit: Payer: No Typology Code available for payment source | Admitting: Pharmacist

## 2012-03-10 DIAGNOSIS — I2699 Other pulmonary embolism without acute cor pulmonale: Secondary | ICD-10-CM

## 2012-03-10 LAB — POCT INR: INR: 1.7

## 2012-03-10 LAB — PROTIME-INR
INR: 1.7 — ABNORMAL LOW (ref 2.00–3.50)
Protime: 20.4 Seconds — ABNORMAL HIGH (ref 10.6–13.4)

## 2012-03-10 NOTE — Progress Notes (Signed)
Continue taking 2.5mg (1/2 tablet) daily.   Recheck INR in 1 week with appointment with [facility]. Lab = 11:15am, Coumadin Clinic = 11:30am and [facility] = 11:45am. 

## 2012-03-10 NOTE — Patient Instructions (Signed)
Continue taking 2.5mg  (1/2 tablet) daily.   Recheck INR in 1 week with appointment with [facility]. Lab = 11:15am, Coumadin Clinic = 11:30am and [facility] = 11:45am.

## 2012-03-11 ENCOUNTER — Encounter: Payer: Self-pay | Admitting: *Deleted

## 2012-03-11 NOTE — Progress Notes (Signed)
RECEIVED A FAX FROM [facility] CONCERNING A CONFIRMATION OF PRESCRIPTION SHIPMENT FOR TARCEVA ON 03/10/12.

## 2012-03-17 ENCOUNTER — Other Ambulatory Visit (HOSPITAL_BASED_OUTPATIENT_CLINIC_OR_DEPARTMENT_OTHER): Payer: No Typology Code available for payment source | Admitting: Lab

## 2012-03-17 ENCOUNTER — Ambulatory Visit (HOSPITAL_BASED_OUTPATIENT_CLINIC_OR_DEPARTMENT_OTHER): Payer: No Typology Code available for payment source | Admitting: Pharmacist

## 2012-03-17 ENCOUNTER — Encounter: Payer: Self-pay | Admitting: Physician Assistant

## 2012-03-17 ENCOUNTER — Ambulatory Visit (HOSPITAL_BASED_OUTPATIENT_CLINIC_OR_DEPARTMENT_OTHER): Payer: No Typology Code available for payment source | Admitting: Physician Assistant

## 2012-03-17 ENCOUNTER — Telehealth: Payer: Self-pay | Admitting: Internal Medicine

## 2012-03-17 VITALS — BP 125/64 | HR 84 | Temp 98.2°F | Resp 20 | Ht 64.0 in | Wt 112.9 lb

## 2012-03-17 DIAGNOSIS — E876 Hypokalemia: Secondary | ICD-10-CM

## 2012-03-17 DIAGNOSIS — C3490 Malignant neoplasm of unspecified part of unspecified bronchus or lung: Secondary | ICD-10-CM

## 2012-03-17 DIAGNOSIS — Z86711 Personal history of pulmonary embolism: Secondary | ICD-10-CM

## 2012-03-17 DIAGNOSIS — I2699 Other pulmonary embolism without acute cor pulmonale: Secondary | ICD-10-CM

## 2012-03-17 DIAGNOSIS — C7951 Secondary malignant neoplasm of bone: Secondary | ICD-10-CM

## 2012-03-17 DIAGNOSIS — C3410 Malignant neoplasm of upper lobe, unspecified bronchus or lung: Secondary | ICD-10-CM

## 2012-03-17 LAB — CBC WITH DIFFERENTIAL/PLATELET
Basophils Absolute: 0.1 10*3/uL (ref 0.0–0.1)
Eosinophils Absolute: 0.1 10*3/uL (ref 0.0–0.5)
HCT: 38.3 % (ref 34.8–46.6)
HGB: 13.2 g/dL (ref 11.6–15.9)
LYMPH%: 16.2 % (ref 14.0–49.7)
MCV: 92.1 fL (ref 79.5–101.0)
MONO%: 6.3 % (ref 0.0–14.0)
NEUT#: 4.2 10*3/uL (ref 1.5–6.5)
Platelets: 243 10*3/uL (ref 145–400)

## 2012-03-17 LAB — COMPREHENSIVE METABOLIC PANEL (CC13)
Albumin: 3.3 g/dL — ABNORMAL LOW (ref 3.5–5.0)
Alkaline Phosphatase: 130 U/L (ref 40–150)
BUN: 9 mg/dL (ref 7.0–26.0)
CO2: 36 mEq/L — ABNORMAL HIGH (ref 22–29)
Glucose: 131 mg/dl — ABNORMAL HIGH (ref 70–99)
Total Bilirubin: 2.1 mg/dL — ABNORMAL HIGH (ref 0.20–1.20)
Total Protein: 6.7 g/dL (ref 6.4–8.3)

## 2012-03-17 LAB — PROTIME-INR: Protime: 30 Seconds — ABNORMAL HIGH (ref 10.6–13.4)

## 2012-03-17 MED ORDER — POTASSIUM CHLORIDE CRYS ER 20 MEQ PO TBCR
40.0000 meq | EXTENDED_RELEASE_TABLET | Freq: Once | ORAL | Status: DC
  Filled 2012-03-17: qty 2

## 2012-03-17 NOTE — Telephone Encounter (Signed)
gve the pt's caregiver the sept/oct 2013 appt calendar along with the ct scan appt with instructions,

## 2012-03-17 NOTE — Patient Instructions (Addendum)
Your potassium is low. Take two 20 meq potassium tablets every day for the next 7 days Follow up with Dr. Arbutus Ped in one month with a restaging CT scan of your chest, abdomen and pelvis to re-evaluate your disease

## 2012-03-17 NOTE — Progress Notes (Signed)
INR therapeutic today (2.5) No missed doses.  No changes in meds or diet.  Has had 2 small nosebleeds over the last week.  These start and stop spontaneously, and never last more than 1 minute.  These are not bothersome to the patient. Will continue current dose of 2.5mg  daily, and recheck INR in 2 weeks to ensure she stays in therapeutic range.

## 2012-03-19 ENCOUNTER — Telehealth: Payer: Self-pay | Admitting: Medical Oncology

## 2012-03-19 DIAGNOSIS — E876 Hypokalemia: Secondary | ICD-10-CM

## 2012-03-19 MED ORDER — POTASSIUM CHLORIDE CRYS ER 20 MEQ PO TBCR: 40.0000 meq | EXTENDED_RELEASE_TABLET | Freq: Every day | ORAL | Status: DC

## 2012-03-19 NOTE — Telephone Encounter (Signed)
[name] called and stated pharmacy did not get potassium rx so i called in kdur 40 meq po daily x 7 days

## 2012-03-23 NOTE — Progress Notes (Signed)
[facility] [phone]   [phone]  OFFICE PROGRESS NOTE  [name], MD [address] [place] [address]  DIAGNOSIS: Recurrent non-small cell lung cancer, adenocarcinoma, initially diagnosed with synchronous stage I involving the right upper lobe and left upper lobe in January 2007.   PRIOR THERAPY:  1. Status post bilateral stereotactic radiotherapy under the care of Dr. Abelardo Diesel at [facility] completed Nov 25, 2005. 2. Status post 6 cycles of systemic chemotherapy with carboplatin and paclitaxel for disease recurrence. Last dose given August 27, 2006 discontinued as the patient had disease stabilization. 3. Status post 6 more cycles of systemic chemotherapy for disease progression with carboplatin and paclitaxel, last dose given July 22, 2010 when the patient had disease stabilization and the treatment was discontinued at that time. 4. Systemic chemotherapy with Alimta at 500 mg/m2 given every 3 weeks status post 9 cycles. 5. Palliative radiotherapy to the metastatic lesion at the right proximal femur.  CURRENT THERAPY:  1. Tarceva 150 mg by mouth daily-status post approximately 2 months of therapy 2. Coumadin 5 mg by mouth daily or as currently directed by the [facility] Coumadin clinic  CODE STATUS: No CODE BLUE  INTERVAL HISTORY: [name] 71 y.o. female returns to the clinic today for followup visit accompanied by her niece. 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 She was started on treatment with subcutaneous Lovenox followed by her primary care physician. However she was not really tolerating the subcutaneous in injections and has been changed to oral Coumadin, currently monitored by the [facility] Coumadin clinic. She reports that the skin rash and dryness are somewhat better. She's not  had any issues with diarrhea related to the Tarceva and overall is tolerating the treatment with Tarceva relatively well. She does have some problems with constipation but is managing that relatively well also. She has a followup appointment in the Coumadin clinic in 2 weeks. She continues to complain of fatigue and weakness.  She has shortness breath with exertion. No significant nausea or vomiting or diarrhea.   MEDICAL HISTORY: Past Medical History  Diagnosis Date  . Hypertension   . Heart murmur   . Hip fracture, left   . COPD (chronic obstructive pulmonary disease)   . Skin cancer   . Lung cancer 09/18/2005    rul =[facility] dx  . Cancer 11/01/11     mets rightfemur//iliac 5.6cm,&left iliac  . Skin cancer   . 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 Scoliosis 11/01/11    l3  . History of chemotherapy     6 cycles carboplatin/paclitaxel,last dose 08/2006,again given 07/22/2010,alimta q 3weeks s/p 9 cycless  . History of radiation therapy 08/10/2007-09/07/2007    right paratracheal area/ Dr.Kinard, [place]  . History of radiation therapy 2007    bilateral sterotatic radiotherapy Dr[facility]McMullen at [facility] completed 11/25/2005  . Anemia   . S/P radiation therapy 12/04/11 - 01/19/12    [facility] : Right Femur/3000 cGy/10 Fractions and Right Iliac Bone / 3500 cGy/14 Fractions  . Bilateral pulmonary embolism   . CVA (cerebral infarction) Documented 02/18/12    Lovenox  . SOB (shortness of breath) on exertion Documented 02/18/12  . Pain     ALLERGIES:  is allergic to other.  MEDICATIONS:  Current Outpatient Prescriptions  Medication Sig Dispense  Refill  . bisoprolol (ZEBETA) 5 MG tablet Take 2.5 mg by mouth daily.       Marland Kitchen erlotinib (TARCEVA) 150 MG tablet Take 150 mg by mouth daily. Take on an empty stomach 1 hour before meals or 2 hours after.      . furosemide (LASIX) 20 MG tablet Take 1 tablet (20 mg total) by mouth 2 (two) times daily. For edema  30 tablet  0  . lisinopril  (PRINIVIL,ZESTRIL) 20 MG tablet Take 20 mg by mouth daily.        Marland Kitchen oxyCODONE (OXYCONTIN) 15 MG TB12 Take 1 tablet (15 mg total) by mouth every 12 (twelve) hours.  30 tablet  0  . oxyCODONE-acetaminophen (PERCOCET) 5-325 MG per tablet Take 1 tablet by mouth every 4 (four) hours as needed. take 1-2 every 4-6hrs      . potassium chloride SA (K-DUR,KLOR-CON) 20 MEQ tablet Take 2 tablets (40 mEq total) by mouth daily.  7 tablet  0  . tiotropium (SPIRIVA) 18 MCG inhalation capsule Place 18 mcg into inhaler and inhale daily.      Marland Kitchen warfarin (COUMADIN) 5 MG tablet Take 2.5 mg by mouth daily.        Current Facility-Administered Medications  Medication Dose Route Frequency Provider Last Rate Last Dose  . potassium chloride SA (K-DUR,KLOR-CON) CR tablet 40 mEq  40 mEq Oral Once Si Gaul, MD       Facility-Administered Medications Ordered in Other Visits  Medication Dose Route Frequency Provider Last Rate Last Dose  . heparin lock flush 100 unit/mL  500 Units Intravenous Once Si Gaul, MD      . sodium chloride 0.9 % injection 10 mL  10 mL Intravenous PRN Si Gaul, MD        SURGICAL HISTORY:  Past Surgical History  Procedure Date  . Portacath placement   . Hip surgery     LEFT HIP  . Femur im nail 11/13/2011    Procedure: INTRAMEDULLARY (IM) NAIL FEMORAL;  Surgeon: Senaida Lange, MD;  Location: [facility] OR;  Service: Orthopedics;  Laterality: Right;  IM NAILING OF RIGHT HIP    REVIEW OF SYSTEMS:  A comprehensive review of systems was negative except for: Constitutional: positive for anorexia, fatigue and weight loss Respiratory: positive for cough and dyspnea on exertion Musculoskeletal: positive for bone pain and muscle weakness   PHYSICAL EXAMINATION: General appearance: alert, cooperative and no distress Neck: no adenopathy Lymph nodes: Cervical, supraclavicular, and axillary nodes normal. Resp: clear to auscultation bilaterally Cardio: regular rate and rhythm, S1, S2  normal, no murmur, click, rub or gallop GI: soft, non-tender; bowel sounds normal; no masses,  no organomegaly and Scattered subcutaneous nodules on the lateral aspects of the abdomen, likely secondary to injection technique of the Lovenox Extremities: extremities normal, atraumatic, no cyanosis or edema Neurologic: Alert and oriented X 3, normal strength and tone. Normal symmetric reflexes. Normal coordination and gait Skin: Generally dry a few scattered acneiform her options over the face with the greatest concentration around the nose and chin. No evidence of superinfection  ECOG PERFORMANCE STATUS: 1 - Symptomatic but completely ambulatory  Blood pressure 125/64, pulse 84, temperature 98.2 F (36.8 C), temperature source Oral, resp. rate 20, height 5\' 4"  (1.626 m), weight 112 lb 14.4 oz (51.211 kg).  LABORATORY DATA: Lab Results  Component Value Date   WBC 5.7 03/17/2012   HGB 13.2 03/17/2012   HCT 38.3 03/17/2012   MCV 92.1 03/17/2012   PLT 243 03/17/2012  Chemistry      Component Value Date/Time   NA 142 03/17/2012 1129   NA 140 02/18/2012 1125   NA 144 08/21/2011 1131   K 2.5* 03/17/2012 1129   K 4.7 02/18/2012 1125   K 5.1* 08/21/2011 1131   CL 92* 03/17/2012 1129   CL 97 02/18/2012 1125   CL 94* 08/21/2011 1131   CO2 36* 03/17/2012 1129   CO2 34* 02/18/2012 1125   CO2 33 08/21/2011 1131   BUN 9.0 03/17/2012 1129   BUN 11 02/18/2012 1125   BUN 14 08/21/2011 1131   CREATININE 0.6 03/17/2012 1129   CREATININE 0.46* 02/18/2012 1125   CREATININE 0.5* 08/21/2011 1131      Component Value Date/Time   CALCIUM 10.0 03/17/2012 1129   CALCIUM 9.8 02/18/2012 1125   CALCIUM 9.7 08/21/2011 1131   ALKPHOS 130 03/17/2012 1129   ALKPHOS 103 02/18/2012 1125   ALKPHOS 108* 08/21/2011 1131   AST 20 03/17/2012 1129   AST 16 02/18/2012 1125   AST 23 08/21/2011 1131   ALT 9 03/17/2012 1129   ALT 9 02/18/2012 1125   BILITOT 2.10* 03/17/2012 1129   BILITOT 1.2 02/18/2012 1125   BILITOT 0.70 08/21/2011 1131         RADIOGRAPHIC STUDIES: Dg Chest 1 View  12/07/2011  *RADIOLOGY REPORT*  Clinical Data: Fall.  History of lung cancer.  CHEST - 1 VIEW  Comparison: Chest 11/11/2011.  The CT chest 11/26/2011.  Findings: Cardiac enlargement.  Normal pulmonary vascularity. Emphysematous changes in the lungs with scattered fibrosis.  Focal scarring in the right upper lung may represent postradiation change.  Spiculated appearing nodular opacities in both hilar regions may be related to scarring but metastasis is not excluded. No focal airspace consolidation.  No blunting of costophrenic angles.  No pneumothorax.  Degenerative changes and scoliosis of the spine.  Calcification of the aorta.  Right power port type central venous catheter with tip over the mid SVC region.  IMPRESSION: Cardiac enlargement.  Emphysema and fibrosis in the lungs. Scarring bilaterally.  Spiculated appearing nodular opacities in both hilar regions.  No active infiltration.  Original Report Authenticated By: Marlon Pel, M.D.   Dg Chest 2 View  12/11/2011  *RADIOLOGY REPORT*  Clinical Data: Shortness of breath, cough and congestion.  Hypoxia.  CHEST - 2 VIEW  Comparison: CT chest and chest radiograph 12/07/2011.  Findings: Trachea is midline.  Heart is enlarged.  Thoracic aorta is calcified.  Right IJ Port-A-Cath tip projects over the SVC.  Linear densities are seen in the perihilar regions bilaterally, with confluence at the hila.  Minimal streaky density at the lung bases bilaterally.  Tiny bilateral pleural effusions. Biapical pleural thickening.  IMPRESSION:  1.  Linear densities in the perihilar regions with confluence of the hilum, as on 12/07/2011. Scattered pulmonary nodules are better seen on 12/07/2011. 2.  Minimal bibasilar atelectasis and tiny bilateral pleural effusions.  Original Report Authenticated By: Reyes Ivan, M.D.   Dg Femur Right  12/07/2011  *RADIOLOGY REPORT*  Clinical Data: Pain after fall.  RIGHT FEMUR - 2  VIEW  Comparison: 11/13/2011  Findings: Postoperative changes in the right hip with intramedullary rod and screw and compression screw fixation.  No evidence of acute fracture or displacement.  No focal bone lesion or bone destruction.  Bone cortex and trabecular architecture appear intact.  Vascular calcifications.  Degenerative changes in the knee.  No significant change in positioning of hardware since previous study.  IMPRESSION: Postoperative  changes with internal fixation of the proximal right femur.  No acute bony abnormalities identified.  Original Report Authenticated By: Marlon Pel, M.D.   Ct Head Without Contrast  12/07/2011  *RADIOLOGY REPORT*  Clinical Data: Right arm weakness  CT HEAD WITHOUT CONTRAST  Technique:  Contiguous axial images were obtained from the base of the skull through the vertex without contrast.  Comparison: 08/05/2010  Findings: Stable mild brain atrophy most pronounced in the frontal lobes.  No acute intracranial hemorrhage, definite mass lesion, infarction, midline shift, herniation, or hydrocephalus.  Gray- white matter differentiation maintained.  Cisterns patent.  No cerebellar abnormality.  Visualized mastoids and sinuses clear.  IMPRESSION: Stable exam.  No acute intracranial process by noncontrast CT  Original Report Authenticated By: Judie Petit. Ruel Favors, M.D.   Ct Angio Chest W/cm &/or Wo Cm  12/07/2011  **ADDENDUM** CREATED: 12/07/2011 05:40:23  The esophagus is mildly distended with an air-fluid level suggesting dysmotility.  **END ADDENDUM** SIGNED BY: Marlon Pel, M.D.   12/07/2011  *RADIOLOGY REPORT*  Clinical Data: Short of breath.  History of lung cancer and COPD. Fall.  CT ANGIOGRAPHY CHEST  Technique:  Multidetector CT imaging of the chest using the standard protocol during bolus administration of intravenous contrast. Multiplanar reconstructed images including MIPs were obtained and reviewed to evaluate the vascular anatomy.  Contrast: OMNIPAQUE  IOHEXOL 350 MG/ML SOLN  Comparison: 11/26/2011  Findings: Technically adequate study with good opacification of the central and segmental pulmonary arteries.  There are focal intraluminal filling defects in the scattered bilateral segmental branches consistent with pulmonary emboli.  Cardiac enlargement.  Calcification of the aortic valve and coronary arteries.  Calcification and ectasia of the aorta. Scarring in the mid lungs bilaterally with mass or adenopathy in the right hilum measuring about 15 mm diameter.  Pretracheal lymphadenopathy measures 12 mm short axis dimension.  Multiple pulmonary nodules are again demonstrated bilaterally consistent with metastatic disease.  Probably the largest is a spiculated nodule in the right upper lung measuring 8 mm diameter. Visualization of the lungs is limited due to respiratory motion artifact.  Diffuse emphysematous changes and fibrosis throughout the lungs.  Atelectasis in the lung bases.  Focal wedge-shaped consolidation in the right lung base posteriorly may represent a focal infarct.  Bronchiectasis and mucus plugging in the lung bases with peribronchial thickening consistent with chronic bronchitis. Bilateral adrenal gland nodules measuring 2.6 cm on the left and 3.3 cm on the right.  These demonstrate low density measurements suggesting adenomas.  Degenerative changes in the thoracic spine. No destructive bone lesions appreciated. Minimal right pleural effusion.  IMPRESSION: Positive study for multiple bilateral segmental pulmonary emboli. Possible small peripheral infarct in the right lung base posteriorly.  Again demonstrated are multiple diffuse bilateral pulmonary metastases, right hilar mass, diffuse emphysematous changes, and fibrosis. Chronic bronchitic changes.  Critical Value/emergent results were called by telephone at the time of interpretation on 12/07/2011  at 0440 hours  to  Dr. Hyacinth Meeker, who verbally acknowledged these results. Cardiac enlargement.   Bilateral adrenal gland nodules.  Original Report Authenticated By: Marlon Pel, M.D.   Mr Laqueta Jean Wo Contrast  12/07/2011  *RADIOLOGY REPORT*  Clinical Data: 71 year old female with metastatic cancer.  Right upper extremity weakness.  MRI HEAD WITHOUT AND WITH CONTRAST  Technique:  Multiplanar, multiecho pulse sequences of the brain and surrounding structures were obtained according to standard protocol without and with intravenous contrast  Contrast: 10mL MULTIHANCE GADOBENATE DIMEGLUMINE 529 MG/ML IV SOLN  Comparison: Head  CTs 12/07/2011 and earlier.  Findings: Multiple small foci of restricted diffusion scattered in the right hemisphere, including the frontal lobe (left upper extremity motor cortex regions series 4 image 18), parietal lobe, and also a fairly dense involvement in the left occipital pole (series 4 image 7).  Additionally, there is a wedge-shaped infarct with restricted diffusion in the left cerebellum (image 4).  Also, there are two suspicious foci of possible diffusion restriction in the right hemisphere, pre motor cortex on image 20 and possible focal right occipital lobe cortex involvement on image 80.  None of these areas show evidence of hemorrhage or mass effect. Major intracranial vascular flow voids are preserved.  No ventriculomegaly.  No midline shift.  No abnormal enhancement or mass identified in the brain.  Chronic lacunar infarcts occasionally noted in the deep gray matter nuclei and cerebellum.  Overall normal cerebral volume. Negative pituitary and cervicomedullary junction.  Visualized bone marrow signal is within normal limits. Degenerative changes and cervical spine. Visualized orbit soft tissues are within normal limits.  Visualized paranasal sinuses and mastoids are clear.  Negative scalp soft tissues.  IMPRESSION: 1.  Multiple scattered small acute infarcts in the left hemisphere involving both the MCA and PCA territories.  Left upper extremity motor cortex region is  affected. 2.  Small wedge-shaped acute infarct also in the left cerebellum. 3.  Suggestion of several punctate infarcts also in the right hemisphere.  This constellation raise the possibility of recent embolic event. 4.  No mass effect or hemorrhage.  Mild for age underlying chronic small vessel ischemia. 5.  No metastatic disease identified.  Original Report Authenticated By: Harley Hallmark, M.D.    ASSESSMENT/PLAN: This is a very pleasant 71 years old white female with metastatic non-small cell lung cancer and recently diagnosed bilateral pulmonary emboli as well as stroke. She is status post palliative radiotherapy to the right femur. The patient was discussed with Dr. Arbutus Ped. She'll continue on Tarceva at 150 mg by mouth daily. She's now status post 2 months of therapy. She'll followup with Dr. Arbutus Ped in one month with a repeat CBC differential, C. met, and CT of the chest abdomen and pelvis with contrast to reevaluate her disease. She is to continue to followup with the Coumadin clinic as scheduled regarding her anticoagulation therapy related to her diagnosis of pulmonary emboli. She'll return in one month for another symptom management visit with a repeat CBC differential and C. met. Regarding treatment for her pulmonary emboli. Her chemistry results reveals significant hypokalemia at a potassium level of 2.5. Patient was given 40 mEq of potassium chloride by mouth at 12:45 PM. She was also given instructions to take 40 mEq of potassium chloride daily for the next 7 days and prescription for this medication was sent to her pharmacy of record via [facility]. Scribe. We will add a D. met to her labs when she returns for her Coumadin clinic visit in 2 weeks to reassess her hypokalemia.  [name], Wendy Roker E, PA-C   All questions were answered. The patient knows to call the clinic with any problems, questions or concerns. We can certainly see the patient much sooner if necessary.  I spent 20 minutes counseling  the patient face to face. The total time spent in the appointment was 30 minutes.

## 2012-03-29 ENCOUNTER — Telehealth: Payer: Self-pay | Admitting: Medical Oncology

## 2012-03-29 DIAGNOSIS — R110 Nausea: Secondary | ICD-10-CM

## 2012-03-29 MED ORDER — PROCHLORPERAZINE MALEATE 10 MG PO TABS: 10.0000 mg | ORAL_TABLET | Freq: Four times a day (QID) | ORAL | Status: AC | PRN

## 2012-03-29 NOTE — Telephone Encounter (Signed)
[name] reports pt has occasional nausea with Tarceva. Called in compazine -family cannot afford zofran,  [name] states pt saw orthopedist PA ( Dr Rennis Chris-) today that  the tumor on the inside of her R femur has grown.  I will notify Dr Donnald Garre

## 2012-03-31 ENCOUNTER — Ambulatory Visit (HOSPITAL_BASED_OUTPATIENT_CLINIC_OR_DEPARTMENT_OTHER): Payer: No Typology Code available for payment source | Admitting: Pharmacist

## 2012-03-31 ENCOUNTER — Other Ambulatory Visit (HOSPITAL_BASED_OUTPATIENT_CLINIC_OR_DEPARTMENT_OTHER): Payer: No Typology Code available for payment source

## 2012-03-31 DIAGNOSIS — I2699 Other pulmonary embolism without acute cor pulmonale: Secondary | ICD-10-CM

## 2012-03-31 DIAGNOSIS — Z7901 Long term (current) use of anticoagulants: Secondary | ICD-10-CM

## 2012-03-31 DIAGNOSIS — E876 Hypokalemia: Secondary | ICD-10-CM

## 2012-03-31 LAB — PROTIME-INR: INR: 2.1 (ref 2.00–3.50)

## 2012-03-31 LAB — BASIC METABOLIC PANEL (CC13)
Chloride: 98 mEq/L (ref 98–107)
Creatinine: 0.6 mg/dL (ref 0.6–1.1)
Potassium: 3.9 mEq/L (ref 3.5–5.1)

## 2012-03-31 NOTE — Progress Notes (Signed)
Continue taking 2.5mg  (1/2 tablet) daily.   Recheck INR in 2 weeks on 04/14/12 with Lab = 11:15am, Coumadin Clinic 11:30am and MD apt = 11:45am.

## 2012-03-31 NOTE — Patient Instructions (Signed)
Continue taking 2.5mg (1/2 tablet) daily.   Recheck INR in 2 weeks on 04/14/12 with Lab = 11:15am, Coumadin Clinic 11:30am and MD apt = 11:45am. 

## 2012-04-01 ENCOUNTER — Other Ambulatory Visit: Payer: Self-pay | Admitting: Medical Oncology

## 2012-04-01 DIAGNOSIS — C3490 Malignant neoplasm of unspecified part of unspecified bronchus or lung: Secondary | ICD-10-CM

## 2012-04-01 NOTE — Progress Notes (Addendum)
Spoke to [employer] -family member about referral to radiation oncology

## 2012-04-02 ENCOUNTER — Telehealth: Payer: Self-pay | Admitting: *Deleted

## 2012-04-02 NOTE — Telephone Encounter (Signed)
Looked at the patient's schedule patient has been given the appointment with dr.kinard for the 04-08-2012 starting at 10:30am

## 2012-04-08 ENCOUNTER — Ambulatory Visit
Admission: RE | Admit: 2012-04-08 | Discharge: 2012-04-08 | Disposition: A | Discharge status: Home / Self Care | Payer: No Typology Code available for payment source | Source: Ambulatory Visit | Attending: Radiation Oncology | Admitting: Radiation Oncology

## 2012-04-08 ENCOUNTER — Encounter: Payer: Self-pay | Admitting: *Deleted

## 2012-04-08 ENCOUNTER — Encounter: Payer: Self-pay | Admitting: Radiation Oncology

## 2012-04-08 VITALS — BP 125/59 | HR 89 | Temp 98.8°F | Wt 113.3 lb

## 2012-04-08 DIAGNOSIS — C3490 Malignant neoplasm of unspecified part of unspecified bronchus or lung: Secondary | ICD-10-CM | POA: Insufficient documentation

## 2012-04-08 DIAGNOSIS — Z510 Encounter for antineoplastic radiation therapy: Secondary | ICD-10-CM | POA: Insufficient documentation

## 2012-04-08 DIAGNOSIS — C7952 Secondary malignant neoplasm of bone marrow: Secondary | ICD-10-CM

## 2012-04-08 DIAGNOSIS — C7951 Secondary malignant neoplasm of bone: Secondary | ICD-10-CM | POA: Insufficient documentation

## 2012-04-08 NOTE — Progress Notes (Signed)
Please see the Nurse Progress Note in the MD Initial Consult Encounter for this patient. 

## 2012-04-08 NOTE — Progress Notes (Signed)
   Department of Radiation Oncology  Phone:  [phone] Fax:        [phone]   Special treatment procedure note  Today the patient underwent evaluation for additional radiation therapy directed at the right proximal leg. Patient has had prior treatments to her right femur. Given the additional time necessary in reviewing her previous treatments as it relates to her current set up and potential for increased toxicities, this constitutes a special treatment procedure.  -----------------------------------  Billie Lade, PhD, MD

## 2012-04-08 NOTE — Progress Notes (Signed)
[facility] lung cancermetastatic  bone mets RAd txs:12/04/11-01/19/12 Right femur 3000 cGy in 10 fractions,  Right iliac bone 3500 cGy in 14 fractions  S/p 6 cycles systemic chemotherapy carboplatin/paclitaxel at [facility] Dr. Abelardo Diesel 11/25/05 S/p 6  cycles same as above last dose 08/27/06 disease recurence S/p 6 more cycles same as above  Last dose 07/22/2010 Systemic chemotherapy [name]  q 3 weeks s/p 9 cycles,  Here for palliative radiotherapy to the metastatic lesion at right proximal femur  Current therapy: Tarceva 150mg  po daily s/p 2 months of therapy Coumadin directed by [facility] coumadin clinic  CD 03/29/12  Hip in chart CVA with B/L P.E. 02/18/12    Patient arrived Sob with exertion   ALlegies:Jewelry=hives

## 2012-04-08 NOTE — Addendum Note (Signed)
Encounter addended by: Tessa Lerner, RN on: 04/08/2012  5:10 PM<BR>     Documentation filed: Charges VN

## 2012-04-08 NOTE — Progress Notes (Signed)
Radiation Oncology         [phone] ________________________________  Name: [name] MRN: [id number]  Date: 04/08/2012  DOB: [birth year]  Reevaluation note  CC: [name], MD  Si Gaul, MD  Diagnosis:   Metastatic non-small cell lung cancer  Interval Since Last Radiation:  2 1/2 half months  Narrative:  The patient returns today for further evaluation.  Patient recently completed radiation treatments to the right femur totaling 3000 cGy. She also completed 3500 cGy directed at the right iliac bone area. After treatment was complete the patient had improvement in her pain however more recently has a recurrence of pain along the right let's region and right groin area.  She did see Dr. supple and plain x-rays revealed the tumorous mass along the medial aspect of her right upper femur. This was palpable by his exam in addition.  Given these findings patient is now seen in radiation oncology for consideration for additional palliative treatment.                           ALLERGIES:  is allergic to other.  Meds: Current Outpatient Prescriptions  Medication Sig Dispense Refill  . bisoprolol (ZEBETA) 5 MG tablet Take 2.5 mg by mouth daily.       Marland Kitchen erlotinib (TARCEVA) 150 MG tablet Take 150 mg by mouth daily. Take on an empty stomach 1 hour before meals or 2 hours after.      . furosemide (LASIX) 20 MG tablet Take 1 tablet (20 mg total) by mouth 2 (two) times daily. For edema  30 tablet  0  . lisinopril (PRINIVIL,ZESTRIL) 20 MG tablet Take 20 mg by mouth daily.        Marland Kitchen oxyCODONE (OXYCONTIN) 15 MG TB12 Take 1 tablet (15 mg total) by mouth every 12 (twelve) hours.  30 tablet  0  . oxyCODONE-acetaminophen (PERCOCET) 5-325 MG per tablet Take 1 tablet by mouth every 4 (four) hours as needed. take 1-2 every 4-6hrs      . potassium chloride SA (K-DUR,KLOR-CON) 20 MEQ tablet Take 2 tablets (40 mEq total) by mouth daily.  7 tablet  0  . prochlorperazine (COMPAZINE) 10 MG tablet  Take 1 tablet (10 mg total) by mouth every 6 (six) hours as needed. For nausea  30 tablet  0  . tiotropium (SPIRIVA) 18 MCG inhalation capsule Place 18 mcg into inhaler and inhale daily.      Marland Kitchen warfarin (COUMADIN) 5 MG tablet Take 2.5 mg by mouth daily.        No current facility-administered medications for this encounter.   Facility-Administered Medications Ordered in Other Encounters  Medication Dose Route Frequency Provider Last Rate Last Dose  . heparin lock flush 100 unit/mL  500 Units Intravenous Once Si Gaul, MD      . sodium chloride 0.9 % injection 10 mL  10 mL Intravenous PRN Si Gaul, MD        Physical Findings: The patient is in no acute distress. Patient is alert and oriented. In the right upper medial thigh area the patient has a palpable mass estimated to be approximately 8- 9 cm in size. Palpation in this area is very uncomfortable for the patient.   Lab Findings: Lab Results  Component Value Date   WBC 5.7 03/17/2012   HGB 13.2 03/17/2012   HCT 38.3 03/17/2012   MCV 92.1 03/17/2012   PLT 243 03/17/2012    @LASTCHEM @  Radiographic Findings:  No results found.  Impression:  Metastatic non-small cell lung cancer. Patient has a progressive mass emanating from the right proximal medial femur which was causing significant pain for her.  Given this finding I would recommend additional palliative radiation therapy to the upper right leg region.  The patient understands this is with increased risk given her prior treatment to this area but given her pain she is willing to accept this additional risk.  Plan:  Simulation in the near future. I anticipate approximately 10 treatments to the area of concern.  _____________________________________    Billie Lade, PhD, MD

## 2012-04-09 ENCOUNTER — Ambulatory Visit (HOSPITAL_COMMUNITY)
Admission: RE | Admit: 2012-04-09 | Discharge: 2012-04-09 | Disposition: A | Discharge status: Home / Self Care | Payer: No Typology Code available for payment source | Source: Ambulatory Visit | Attending: Physician Assistant | Admitting: Physician Assistant

## 2012-04-09 DIAGNOSIS — R0602 Shortness of breath: Secondary | ICD-10-CM | POA: Insufficient documentation

## 2012-04-09 DIAGNOSIS — R109 Unspecified abdominal pain: Secondary | ICD-10-CM | POA: Insufficient documentation

## 2012-04-09 DIAGNOSIS — R05 Cough: Secondary | ICD-10-CM | POA: Insufficient documentation

## 2012-04-09 DIAGNOSIS — R059 Cough, unspecified: Secondary | ICD-10-CM | POA: Insufficient documentation

## 2012-04-09 DIAGNOSIS — M899 Disorder of bone, unspecified: Secondary | ICD-10-CM | POA: Insufficient documentation

## 2012-04-09 DIAGNOSIS — R0789 Other chest pain: Secondary | ICD-10-CM | POA: Insufficient documentation

## 2012-04-09 DIAGNOSIS — R110 Nausea: Secondary | ICD-10-CM | POA: Insufficient documentation

## 2012-04-09 DIAGNOSIS — K5900 Constipation, unspecified: Secondary | ICD-10-CM | POA: Insufficient documentation

## 2012-04-09 DIAGNOSIS — C7951 Secondary malignant neoplasm of bone: Secondary | ICD-10-CM | POA: Insufficient documentation

## 2012-04-09 DIAGNOSIS — C3490 Malignant neoplasm of unspecified part of unspecified bronchus or lung: Secondary | ICD-10-CM | POA: Insufficient documentation

## 2012-04-09 MED ORDER — IOHEXOL 300 MG/ML  SOLN
100.0000 mL | Freq: Once | INTRAMUSCULAR | Status: AC | PRN
  Administered 2012-04-09: 100 mL via INTRAVENOUS

## 2012-04-12 ENCOUNTER — Other Ambulatory Visit: Payer: Self-pay | Admitting: *Deleted

## 2012-04-12 DIAGNOSIS — C3490 Malignant neoplasm of unspecified part of unspecified bronchus or lung: Secondary | ICD-10-CM

## 2012-04-12 MED ORDER — ERLOTINIB HCL 150 MG PO TABS: 150.0000 mg | ORAL_TABLET | Freq: Every day | ORAL | Status: DC

## 2012-04-13 ENCOUNTER — Other Ambulatory Visit: Payer: Self-pay | Admitting: Radiation Oncology

## 2012-04-14 ENCOUNTER — Ambulatory Visit: Payer: No Typology Code available for payment source | Admitting: Physician Assistant

## 2012-04-14 ENCOUNTER — Ambulatory Visit (HOSPITAL_BASED_OUTPATIENT_CLINIC_OR_DEPARTMENT_OTHER): Payer: No Typology Code available for payment source | Admitting: Pharmacist

## 2012-04-14 ENCOUNTER — Other Ambulatory Visit (HOSPITAL_BASED_OUTPATIENT_CLINIC_OR_DEPARTMENT_OTHER): Payer: No Typology Code available for payment source | Admitting: Lab

## 2012-04-14 ENCOUNTER — Other Ambulatory Visit: Payer: No Typology Code available for payment source | Admitting: Lab

## 2012-04-14 ENCOUNTER — Ambulatory Visit (HOSPITAL_BASED_OUTPATIENT_CLINIC_OR_DEPARTMENT_OTHER): Payer: No Typology Code available for payment source | Admitting: Internal Medicine

## 2012-04-14 VITALS — BP 124/68 | HR 75 | Temp 98.6°F | Resp 18 | Ht 64.0 in | Wt 113.0 lb

## 2012-04-14 DIAGNOSIS — C3410 Malignant neoplasm of upper lobe, unspecified bronchus or lung: Secondary | ICD-10-CM

## 2012-04-14 DIAGNOSIS — R5381 Other malaise: Secondary | ICD-10-CM

## 2012-04-14 DIAGNOSIS — C7952 Secondary malignant neoplasm of bone marrow: Secondary | ICD-10-CM

## 2012-04-14 DIAGNOSIS — M25559 Pain in unspecified hip: Secondary | ICD-10-CM

## 2012-04-14 DIAGNOSIS — C3490 Malignant neoplasm of unspecified part of unspecified bronchus or lung: Secondary | ICD-10-CM

## 2012-04-14 DIAGNOSIS — I2699 Other pulmonary embolism without acute cor pulmonale: Secondary | ICD-10-CM

## 2012-04-14 DIAGNOSIS — R52 Pain, unspecified: Secondary | ICD-10-CM

## 2012-04-14 DIAGNOSIS — Z86711 Personal history of pulmonary embolism: Secondary | ICD-10-CM

## 2012-04-14 DIAGNOSIS — Z7901 Long term (current) use of anticoagulants: Secondary | ICD-10-CM

## 2012-04-14 LAB — CBC WITH DIFFERENTIAL/PLATELET
Basophils Absolute: 0.1 10*3/uL (ref 0.0–0.1)
Eosinophils Absolute: 0.3 10*3/uL (ref 0.0–0.5)
HGB: 12.7 g/dL (ref 11.6–15.9)
MONO#: 0.5 10*3/uL (ref 0.1–0.9)
MONO%: 6.8 % (ref 0.0–14.0)
NEUT#: 5.2 10*3/uL (ref 1.5–6.5)
RBC: 3.97 10*6/uL (ref 3.70–5.45)
RDW: 13.8 % (ref 11.2–14.5)
WBC: 7 10*3/uL (ref 3.9–10.3)
lymph#: 1 10*3/uL (ref 0.9–3.3)

## 2012-04-14 LAB — COMPREHENSIVE METABOLIC PANEL (CC13)
Albumin: 3.3 g/dL — ABNORMAL LOW (ref 3.5–5.0)
Alkaline Phosphatase: 166 U/L — ABNORMAL HIGH (ref 40–150)
BUN: 8 mg/dL (ref 7.0–26.0)
CO2: 35 mEq/L — ABNORMAL HIGH (ref 22–29)
Calcium: 10.2 mg/dL (ref 8.4–10.4)
Chloride: 93 mEq/L — ABNORMAL LOW (ref 98–107)
Glucose: 106 mg/dl — ABNORMAL HIGH (ref 70–99)
Potassium: 3.4 mEq/L — ABNORMAL LOW (ref 3.5–5.1)
Sodium: 141 mEq/L (ref 136–145)
Total Protein: 6.8 g/dL (ref 6.4–8.3)

## 2012-04-14 LAB — PROTIME-INR
INR: 2.6 (ref 2.00–3.50)
Protime: 31.2 Seconds — ABNORMAL HIGH (ref 10.6–13.4)

## 2012-04-14 NOTE — Patient Instructions (Signed)
You have evidence for disease progression on his recent scan. We discussed treatment options including systemic chemotherapy versus palliative care and hospice referral. You requested some time to think about these options before calling the office.

## 2012-04-14 NOTE — Progress Notes (Signed)
[name] [phone]   [phone]  OFFICE PROGRESS NOTE  [name], MD [address] [place] [address]  DIAGNOSIS: Recurrent non-small cell lung cancer, adenocarcinoma, initially diagnosed with synchronous stage I involving the right upper lobe and left upper lobe in January 2007.   PRIOR THERAPY:  1. Status post bilateral stereotactic radiotherapy under the care of Dr. Abelardo Diesel at [facility] completed Nov 25, 2005. 2. Status post 6 cycles of systemic chemotherapy with carboplatin and paclitaxel for disease recurrence. Last dose given August 27, 2006 discontinued as the patient had disease stabilization. 3. Status post 6 more cycles of systemic chemotherapy for disease progression with carboplatin and paclitaxel, last dose given July 22, 2010 when the patient had disease stabilization and the treatment was discontinued at that time. 4. Systemic chemotherapy with Alimta at 500 mg/m2 given every 3 weeks status post 9 cycles. 5. Palliative radiotherapy to the metastatic lesion at the right proximal femur.  CURRENT THERAPY:  1. Tarceva 150 mg by mouth daily-status post approximately 2 months of therapy 2. Coumadin 5 mg by mouth daily or as currently directed by the [facility] clinic  INTERVAL HISTORY: [name] 71 y.o. female returns to the clinic today for followup visit accompanied by her niece. The patient continues to complain of fatigue weakness as well as shortness breath with exertion and pain in the right hip area. She will start palliative radiotherapy to the right hip area under the care of Dr. Roselind Messier. The patient is tolerating her treatment was Tarceva fairly well with no significant adverse effects. She denied having any significant cough or hemoptysis. She has very mild skin rash and no significant diarrhea. Her pain medication currently is in the form of OxyContin 50 mg by mouth every  12 hours in addition to Percocet on as-needed basis for pain. The patient has repeat CT scan of the chest, abdomen and pelvis performed recently and she is here for evaluation and discussion of her scan results. She is also on Coumadin for history of deep venous thrombosis.  MEDICAL HISTORY: Past Medical History  Diagnosis Date  . Hypertension   . Heart murmur   . Hip fracture, left   . COPD (chronic obstructive pulmonary disease)   . Skin cancer   . Skin cancer   . 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 Scoliosis 11/01/11    l3  . History of chemotherapy     6 cycles carboplatin/paclitaxel,last dose 08/2006,again given 07/22/2010,alimta q 3weeks s/p 9 cycless  . History of radiation therapy 08/10/2007-09/07/2007    right paratracheal area/ Dr.Kinard, [place]  . History of radiation therapy 2007    bilateral sterotatic radiotherapy Dr[facility]McMullen at [facility] completed 11/25/2005  . Anemia   . S/P radiation therapy 12/04/11 - 01/19/12    [facility] : Right Femur/3000 cGy/10 Fractions and Right Iliac Bone / 3500 cGy/14 Fractions  . Bilateral pulmonary embolism   . CVA (cerebral infarction) Documented 02/18/12    Lovenox  . SOB (shortness of breath) on exertion Documented 02/18/12  . Pain   . Lung cancer 09/18/2005    rul =[facility] dx  . Cancer 11/01/11     mets rightfemur//iliac 5.6cm,&left iliac    ALLERGIES:  is allergic to other.  MEDICATIONS:  Current Outpatient Prescriptions  Medication Sig Dispense Refill  . bisoprolol (ZEBETA) 5 MG tablet Take 2.5 mg by mouth daily.       Marland Kitchen  erlotinib (TARCEVA) 150 MG tablet Take 1 tablet (150 mg total) by mouth daily. Take on an empty stomach 1 hour before meals or 2 hours after.  30 tablet  1  . furosemide (LASIX) 20 MG tablet Take 1 tablet (20 mg total) by mouth 2 (two) times daily. For edema  30 tablet  0  . lisinopril (PRINIVIL,ZESTRIL) 20 MG tablet Take 20 mg by mouth daily.        Marland Kitchen oxyCODONE (OXYCONTIN) 15 MG TB12 Take 1 tablet  (15 mg total) by mouth every 12 (twelve) hours.  30 tablet  0  . oxyCODONE-acetaminophen (PERCOCET) 5-325 MG per tablet Take 1 tablet by mouth every 4 (four) hours as needed. take 1-2 every 4-6hrs      . prochlorperazine (COMPAZINE) 10 MG tablet Take 1 tablet (10 mg total) by mouth every 6 (six) hours as needed. For nausea  30 tablet  0  . tiotropium (SPIRIVA) 18 MCG inhalation capsule Place 18 mcg into inhaler and inhale daily.      Marland Kitchen warfarin (COUMADIN) 5 MG tablet Take 2.5 mg by mouth daily.        No current facility-administered medications for this visit.   Facility-Administered Medications Ordered in Other Visits  Medication Dose Route Frequency Provider Last Rate Last Dose  . heparin lock flush 100 unit/mL  500 Units Intravenous Once Si Gaul, MD      . sodium chloride 0.9 % injection 10 mL  10 mL Intravenous PRN Si Gaul, MD        SURGICAL HISTORY:  Past Surgical History  Procedure Date  . Portacath placement   . Hip surgery     LEFT HIP  . Femur im nail 11/13/2011    Procedure: INTRAMEDULLARY (IM) NAIL FEMORAL;  Surgeon: Senaida Lange, MD;  Location: [facility] OR;  Service: Orthopedics;  Laterality: Right;  IM NAILING OF RIGHT HIP    REVIEW OF SYSTEMS:  A comprehensive review of systems was negative except for: Constitutional: positive for fatigue Respiratory: positive for dyspnea on exertion Musculoskeletal: positive for bone pain   PHYSICAL EXAMINATION: General appearance: alert, cooperative and no distress Head: Normocephalic, without obvious abnormality, atraumatic Neck: no adenopathy Lymph nodes: Cervical, supraclavicular, and axillary nodes normal. Resp: clear to auscultation bilaterally Cardio: regular rate and rhythm, S1, S2 normal, no murmur, click, rub or gallop GI: soft, non-tender; bowel sounds normal; no masses,  no organomegaly Extremities: extremities normal, atraumatic, no cyanosis or edema Neurologic: Alert and oriented X 3, normal strength and  tone. Normal symmetric reflexes. Normal coordination and gait  ECOG PERFORMANCE STATUS: 2 - Symptomatic, <50% confined to bed  Blood pressure 124/68, pulse 75, temperature 98.6 F (37 C), temperature source Oral, resp. rate 18, height 5\' 4"  (1.626 m), weight 113 lb (51.256 kg).  LABORATORY DATA: Lab Results  Component Value Date   WBC 7.0 04/14/2012   HGB 12.7 04/14/2012   HCT 36.7 04/14/2012   MCV 92.5 04/14/2012   PLT 336 04/14/2012      Chemistry      Component Value Date/Time   NA 141 04/14/2012 1129   NA 140 02/18/2012 1125   NA 144 08/21/2011 1131   K 3.4* 04/14/2012 1129   K 4.7 02/18/2012 1125   K 5.1* 08/21/2011 1131   CL 93* 04/14/2012 1129   CL 97 02/18/2012 1125   CL 94* 08/21/2011 1131   CO2 35* 04/14/2012 1129   CO2 34* 02/18/2012 1125   CO2 33 08/21/2011 1131  BUN 8.0 04/14/2012 1129   BUN 11 02/18/2012 1125   BUN 14 08/21/2011 1131   CREATININE 0.6 04/14/2012 1129   CREATININE 0.46* 02/18/2012 1125   CREATININE 0.5* 08/21/2011 1131      Component Value Date/Time   CALCIUM 10.2 04/14/2012 1129   CALCIUM 9.8 02/18/2012 1125   CALCIUM 9.7 08/21/2011 1131   ALKPHOS 166* 04/14/2012 1129   ALKPHOS 103 02/18/2012 1125   ALKPHOS 108* 08/21/2011 1131   AST 19 04/14/2012 1129   AST 16 02/18/2012 1125   AST 23 08/21/2011 1131   ALT 12 04/14/2012 1129   ALT 9 02/18/2012 1125   BILITOT 1.70* 04/14/2012 1129   BILITOT 1.2 02/18/2012 1125   BILITOT 0.70 08/21/2011 1131       RADIOGRAPHIC STUDIES: Ct Chest W Contrast  04/09/2012  *RADIOLOGY REPORT*  Clinical Data:  Lung cancer diagnosed in 2007.  Chemotherapy in progress.  Radiation therapy began on right hip.  Bone metastasis diagnosed 05/13.  Chest pain.  Cough.  Shortness of breath. Diffuse abdominal pain.  Nausea.  Constipation.  CT CHEST, ABDOMEN AND PELVIS WITH CONTRAST  Technique: Contiguous axial images of the chest abdomen and pelvis were obtained after IV contrast administration.  Contrast: 100  ml Omnipaque-300  Comparison: Plain  film chest 12/11/2011. CTA chest for pulmonary embolism 12/07/2011. Routine CTs of 11/26/2011.   CT CHEST  Findings: Lung windows demonstrate probable secretions in the dependent trachea.  Moderate to severe centrilobular emphysema. Radiation changes within the central right upper lobe, unchanged. Radiation fibrosis in the perihilar left upper lobe as well.  Numerous bilateral pulmonary nodules again identified. - 8 mm right lower lobe nodule on image 30 which is increased from 5 mm on the prior exam (comparison to 11/26/2011). - 1.1 cm left lower lobe lung nodule on image 42 which is enlarged from 6 mm on the prior.  Soft tissue windows demonstrate no supraclavicular adenopathy. Right-sided Port-A-Cath which terminates at the high right atrium. The descending thoracic aorta is mildly prominent at up to 3.0 cm, unchanged.  Mild cardiomegaly with multivessel coronary artery atherosclerosis. No pericardial or pleural effusion.  No definite residual pulmonary embolism, on this non dedicated study.  A right paratracheal node measures 9 mm on image 20 versus 5 mm on the 12/07/2011 exam.  Left hilar nodal tissue measures 1.0 cm and is not pathologic by size criteria; similar to on the prior PE study.  Moderate osteopenia.  IMPRESSION:  1.  Mild enlargement of pulmonary nodules since 11/26/2011, consistent with disease progression.  There has also been progression since 12/07/2011, but that exam is motion degraded. 3.  9 mm right paratracheal node appears enlarged since 12/07/2011 and nodal metastasis cannot be excluded.   CT ABDOMEN AND PELVIS  Findings:  A probable cyst in the left lobe of the liver at 5 mm on image 52.  Other tiny hepatic lesions which are also likely cysts.  Normal spleen.  Underdistended stomach.  Normal pancreas, gallbladder, biliary tract.  The adrenal glands remain diffusely enlarged but maintain their adreniform shape.  No well-defined dominant mass.  Grossly similar to on the prior and present  back to 05/19/2011.  Right renal cysts.  Non aneurysmal dilatation of the infrarenal abdominal aorta which is unchanged. No retroperitoneal or retrocrural adenopathy.  Colonic stool burden suggests constipation.  Normal terminal ileum and appendix.  Normal small bowel without abdominal ascites.    No evidence of omental or peritoneal disease.  No pelvic adenopathy.  Normal urinary  bladder.  Probable pelvic floor laxity. Left adnexal/ovarian cystic lesion is unchanged at 4.0 cm on image 90. No significant free fluid.  Moderate osteopenia.  Bilateral intramedullary rod placement within the femur.  A mixed lytic and surrounding soft tissue mass is progressive in the right iliac.  4.6 x 5.2 cm on image 75 versus 2.3 x 2.2 cm on the prior exam.  Progression of the right femoral neck osseous metastasis with secondary pathologic fracture.  Accentuation of expected thoracic kyphosis.  Moderate S-shaped thoracolumbar spine curvature.  IMPRESSION:  1.  Progression of osseous metastasis. 2.  No extraosseous metastasis within the abdomen or pelvis.   Original Report Authenticated By: Consuello Bossier, M.D.    ASSESSMENT: This is a very pleasant 71 years old white female with metastatic non-small cell lung cancer most recently treated with Tarceva 150 mg by mouth daily but unfortunately she continues to have evidence for disease progression in the chest and bone. She will resume palliative radiotherapy to the right hip area under the care of Dr. Roselind Messier.   PLAN: I have a lengthy discussion with the patient and her niece today about her current condition, prognosis and treatment options. I recommended for the patient to continue with the palliative radiotherapy as scheduled. I would also discontinue Tarceva at this point since the patient has evidence for disease progression. I gave the patient the option of treatment with systemic chemotherapy in the form of single agent docetaxel or gemcitabine versus palliative care and  hospice referral. The patient requested some time to think about her options and she would call my office with her decision. I also asked her to have a dental clearance before considering her for treatment with Xgeva for her bone disease. I did not give her a followup appointment at this point the waiting for the patient awaiting her final decision regarding chemotherapy.  All questions were answered. The patient knows to call the clinic with any problems, questions or concerns. We can certainly see the patient much sooner if necessary.  I spent 15 minutes counseling the patient face to face. The total time spent in the appointment was 25 minutes.

## 2012-04-14 NOTE — Progress Notes (Signed)
INR therapeutic today (2.6) on 2.5mg  daily. No missed doses.  No problems with bleeding or bruising besides intermittent nosebleeds that last ~14min.  These are likely r/t her O2 therapy, which is still not humidified.   No changes in meds or diet. Will have pt continue current dose, and recheck INR in 3 weeks.  Pt will ask Dr. Arbutus Ped about getting humidification for her O2 therapy today.

## 2012-04-14 NOTE — Telephone Encounter (Signed)
[facility] faxed confirmation of prescription shipment.  Tarceva shipped 04-13-2012 with next business day delivery.

## 2012-04-15 ENCOUNTER — Ambulatory Visit
Admission: RE | Admit: 2012-04-15 | Discharge: 2012-04-15 | Disposition: A | Discharge status: Home / Self Care | Payer: No Typology Code available for payment source | Source: Ambulatory Visit | Attending: Radiation Oncology | Admitting: Radiation Oncology

## 2012-04-15 DIAGNOSIS — C7951 Secondary malignant neoplasm of bone: Secondary | ICD-10-CM

## 2012-04-15 NOTE — Progress Notes (Signed)
Radiation Oncology         [phone] ________________________________  Name: [name] MRN: [id number]  Date: 04/15/2012  DOB: 1941/04/18  SIMULATION AND TREATMENT PLANNING NOTE  DIAGNOSIS:  Metastatic non-small cell lung cancer  NARRATIVE:  The patient was brought to the CT Simulation planning suite.  Identity was confirmed.  All relevant records and images related to the planned course of therapy were reviewed.  The patient freely provided informed written consent to proceed with treatment after reviewing the details related to the planned course of therapy. The consent form was witnessed and verified by the simulation staff.  Then, the patient was set-up in a stable reproducible  supine position for radiation therapy.  CT images were obtained.  Surface markings were placed.  The CT images were loaded into the planning software.  Then the target and avoidance structures were contoured.  Treatment planning then occurred.  The radiation prescription was entered and confirmed.  A total of 3 complex treatment devices were fabricated. I have requested : Isodose Plan.  I have ordered:rad calc.  PLAN:  The patient will receive 2400 cGy in 8 fractions.    Special treatment procedure      The patient has had previous radiation treatments to the right femur region. Additional time was taken in reviewing the patient's previous treatment as it relates to her current set up. Given the increased time as well as the potential for increased toxicities with retreatment in this area, this constitutes a special treatment procedure  _____________________________    Billie Lade, PhD, MD

## 2012-04-16 NOTE — Telephone Encounter (Signed)
RECEIVED A FAX FROM [facility] CONCERNING A CONFIRMATION OF FACSIMILE RECEIPT FOR PT.'S REFERRAL WHICH WAS SENT ON 04/12/12.

## 2012-04-19 ENCOUNTER — Ambulatory Visit: Payer: No Typology Code available for payment source | Admitting: Radiation Oncology

## 2012-04-20 ENCOUNTER — Ambulatory Visit
Admission: RE | Admit: 2012-04-20 | Discharge: 2012-04-20 | Disposition: A | Discharge status: Home / Self Care | Payer: No Typology Code available for payment source | Source: Ambulatory Visit | Attending: Radiation Oncology | Admitting: Radiation Oncology

## 2012-04-20 ENCOUNTER — Ambulatory Visit: Payer: No Typology Code available for payment source | Admitting: Radiation Oncology

## 2012-04-20 DIAGNOSIS — C7951 Secondary malignant neoplasm of bone: Secondary | ICD-10-CM

## 2012-04-20 NOTE — Progress Notes (Signed)
  Radiation Oncology         [phone] ________________________________  Name: [name] MRN: [id number]  Date: 04/20/2012  DOB: 08/01/1940  Simulation Verification Note  Status: outpatient  NARRATIVE: The patient was brought to the treatment unit and placed in the planned treatment position. The clinical setup was verified. Then port films were obtained and uploaded to the radiation oncology medical record software.  The treatment beams were carefully compared against the planned radiation fields. The position location and shape of the radiation fields was reviewed. They targeted volume of tissue appears to be appropriately covered by the radiation beams. Organs at risk appear to be excluded as planned.  Based on my personal review, I approved the simulation verification. The patient's treatment will proceed as planned.  -----------------------------------  Billie Lade, PhD, MD

## 2012-04-20 NOTE — Progress Notes (Signed)
[facility]    Radiation Oncology [address] [place]     [name], M.D. [place], [place] [address]               Billie Lade, M.D., Ph.D. Phone: [phone]      Molli Hazard A. Kathrynn Running, M.D. Fax: [phone]      Radene Gunning, M.D., Ph.D.         Lurline Hare, M.D.         Grayland Jack, M.D Weekly Treatment Management Note  Name: [name]     MRN: [id number]        CSN: [id number] Date: 04/20/2012      DOB: 1940/09/01  CC: Wendy Puffer, MD         [name]    Status: Outpatient  Diagnosis: The encounter diagnosis was Secondary malignant neoplasm of bone and bone marrow.  Current Dose: 300 cGy  Current Fraction: 1/8  Planned Dose: 2400 cGy  Narrative: [name] was seen today for weekly treatment management. The chart was checked and port films  were reviewed. She is tolerating the radiation therapy well thus far.  Other Current Outpatient Prescriptions  Medication Sig Dispense Refill  . bisoprolol (ZEBETA) 5 MG tablet Take 2.5 mg by mouth daily.       [name] erlotinib (TARCEVA) 150 MG tablet Take 1 tablet (150 mg total) by mouth daily. Take on an empty stomach 1 hour before meals or 2 hours after.  30 tablet  1  . furosemide (LASIX) 20 MG tablet Take 1 tablet (20 mg total) by mouth 2 (two) times daily. For edema  30 tablet  0  . lisinopril (PRINIVIL,ZESTRIL) 20 MG tablet Take 20 mg by mouth daily.        [name] oxyCODONE (OXYCONTIN) 15 MG TB12 Take 1 tablet (15 mg total) by mouth every 12 (twelve) hours.  30 tablet  0  . oxyCODONE-acetaminophen (PERCOCET) 5-325 MG per tablet Take 1 tablet by mouth every 4 (four) hours as needed. take 1-2 every 4-6hrs      . prochlorperazine (COMPAZINE) 10 MG tablet Take 1 tablet (10 mg total) by mouth every 6 (six) hours as needed. For nausea  30 tablet  0  . tiotropium (SPIRIVA) 18 MCG inhalation capsule Place 18 mcg into inhaler and inhale daily.      [name] warfarin (COUMADIN) 5 MG tablet Take 2.5 mg by mouth  daily.        No current facility-administered medications for this encounter.   Facility-Administered Medications Ordered in Other Encounters  Medication Dose Route Frequency Provider Last Rate Last Dose  . heparin lock flush 100 unit/mL  500 Units Intravenous Once Wendy Gaul, MD      . sodium chloride 0.9 % injection 10 mL  10 mL Intravenous PRN Wendy Gaul, MD       Labs:  Lab Results  Component Value Date   WBC 7.0 04/14/2012   HGB 12.7 04/14/2012   HCT 36.7 04/14/2012   MCV 92.5 04/14/2012   PLT 336 04/14/2012   Lab Results  Component Value Date   CREATININE 0.6 04/14/2012   BUN 8.0 04/14/2012   NA 141 04/14/2012   K 3.4* 04/14/2012   CL 93* 04/14/2012   CO2 35* 04/14/2012   Lab Results  Component Value Date   ALT 12 04/14/2012   AST 19 04/14/2012   BILITOT 1.70* 04/14/2012    Physical Examination:  vitals were not taken for this visit.  Wt Readings from Last 3 Encounters:  04/14/12 113 lb (51.256 kg)  04/08/12 113 lb 4.8 oz (51.393 kg)  03/17/12 112 lb 14.4 oz (51.211 kg)     Lungs - Normal respiratory effort, chest expands symmetrically. Lungs are clear to auscultation, no crackles or wheezes.  Heart has regular rhythm and rate  Abdomen is soft and non tender with normal bowel sounds  Assessment:  Patient tolerating treatments well  Plan: Continue treatment per original radiation prescription

## 2012-04-21 ENCOUNTER — Ambulatory Visit
Admission: RE | Admit: 2012-04-21 | Discharge: 2012-04-21 | Disposition: A | Discharge status: Home / Self Care | Payer: No Typology Code available for payment source | Source: Ambulatory Visit | Attending: Radiation Oncology | Admitting: Radiation Oncology

## 2012-04-22 ENCOUNTER — Other Ambulatory Visit: Payer: Self-pay

## 2012-04-22 ENCOUNTER — Other Ambulatory Visit: Payer: Self-pay | Admitting: Medical Oncology

## 2012-04-22 ENCOUNTER — Ambulatory Visit
Admission: RE | Admit: 2012-04-22 | Discharge: 2012-04-22 | Disposition: A | Discharge status: Home / Self Care | Payer: No Typology Code available for payment source | Source: Ambulatory Visit | Attending: Radiation Oncology | Admitting: Radiation Oncology

## 2012-04-22 DIAGNOSIS — C3490 Malignant neoplasm of unspecified part of unspecified bronchus or lung: Secondary | ICD-10-CM

## 2012-04-22 NOTE — Progress Notes (Signed)
Daughter /pt ready to talk to Dr Arbutus Ped about specific chemo options . I sent onc tx request for appt

## 2012-04-23 ENCOUNTER — Telehealth: Payer: Self-pay | Admitting: *Deleted

## 2012-04-23 ENCOUNTER — Ambulatory Visit
Admission: RE | Admit: 2012-04-23 | Discharge: 2012-04-23 | Disposition: A | Discharge status: Home / Self Care | Payer: No Typology Code available for payment source | Source: Ambulatory Visit | Attending: Radiation Oncology | Admitting: Radiation Oncology

## 2012-04-23 NOTE — Telephone Encounter (Signed)
Patient's daughter has been notified of the new date and time

## 2012-04-26 ENCOUNTER — Ambulatory Visit
Admission: RE | Admit: 2012-04-26 | Discharge: 2012-04-26 | Disposition: A | Discharge status: Home / Self Care | Payer: No Typology Code available for payment source | Source: Ambulatory Visit | Attending: Radiation Oncology | Admitting: Radiation Oncology

## 2012-04-27 ENCOUNTER — Ambulatory Visit
Admission: RE | Admit: 2012-04-27 | Discharge: 2012-04-27 | Disposition: A | Discharge status: Home / Self Care | Payer: No Typology Code available for payment source | Source: Ambulatory Visit | Attending: Radiation Oncology | Admitting: Radiation Oncology

## 2012-04-27 VITALS — BP 151/76 | HR 80 | Temp 98.2°F | Wt 111.9 lb

## 2012-04-27 DIAGNOSIS — C7952 Secondary malignant neoplasm of bone marrow: Secondary | ICD-10-CM

## 2012-04-27 NOTE — Progress Notes (Signed)
[facility]    Radiation Oncology [address] [place]     [name], M.D. [place], [place] [address]               Billie Lade, M.D., Ph.D. Phone: [phone]      Molli Hazard A. Kathrynn Running, M.D. Fax: [phone]      Radene Gunning, M.D., Ph.D.         Lurline Hare, M.D.         Grayland Jack, M.D Weekly Treatment Management Note  Name: [name]     MRN: [id number]        CSN: [id number] Date: 04/27/2012      DOB: 26-Dec-1940  CC: [name], MD         [name]    Status: Outpatient  Diagnosis: The encounter diagnosis was Secondary malignant neoplasm of bone and bone marrow.  Current Dose: 1800 cGy  Current Fraction: 6  Planned Dose: 2400 cGy  Narrative: [name] was seen today for weekly treatment management. The chart was checked and port films  were reviewed. Pain located in back from lying on treatment table.Patient did take her percocet prior to treatment. fatigue as patient already deconditioned prior to treatment.  Pain is unchanged in the right upper leg area.   Other Current Outpatient Prescriptions  Medication Sig Dispense Refill  . bisoprolol (ZEBETA) 5 MG tablet Take 2.5 mg by mouth daily.       Marland Kitchen erlotinib (TARCEVA) 150 MG tablet Take 1 tablet (150 mg total) by mouth daily. Take on an empty stomach 1 hour before meals or 2 hours after.  30 tablet  1  . furosemide (LASIX) 20 MG tablet Take 1 tablet (20 mg total) by mouth 2 (two) times daily. For edema  30 tablet  0  . lisinopril (PRINIVIL,ZESTRIL) 20 MG tablet Take 20 mg by mouth daily.        Marland Kitchen oxyCODONE (OXYCONTIN) 15 MG TB12 Take 1 tablet (15 mg total) by mouth every 12 (twelve) hours.  30 tablet  0  . oxyCODONE-acetaminophen (PERCOCET) 5-325 MG per tablet Take 1 tablet by mouth every 4 (four) hours as needed. take 1-2 every 4-6hrs      . prochlorperazine (COMPAZINE) 10 MG tablet Take 1 tablet (10 mg total) by mouth every 6 (six) hours as needed. For nausea  30 tablet  0  .  tiotropium (SPIRIVA) 18 MCG inhalation capsule Place 18 mcg into inhaler and inhale daily.      Marland Kitchen warfarin (COUMADIN) 5 MG tablet Take 2.5 mg by mouth daily.        No current facility-administered medications for this encounter.   Facility-Administered Medications Ordered in Other Encounters  Medication Dose Route Frequency Provider Last Rate Last Dose  . heparin lock flush 100 unit/mL  500 Units Intravenous Once Si Gaul, MD      . sodium chloride 0.9 % injection 10 mL  10 mL Intravenous PRN Si Gaul, MD       Labs:  Lab Results  Component Value Date   WBC 7.0 04/14/2012   HGB 12.7 04/14/2012   HCT 36.7 04/14/2012   MCV 92.5 04/14/2012   PLT 336 04/14/2012   Lab Results  Component Value Date   CREATININE 0.6 04/14/2012   BUN 8.0 04/14/2012   NA 141 04/14/2012   K 3.4* 04/14/2012   CL 93* 04/14/2012   CO2 35* 04/14/2012   Lab Results  Component Value Date  ALT 12 04/14/2012   AST 19 04/14/2012   BILITOT 1.70* 04/14/2012    Physical Examination:  weight is 111 lb 14.4 oz (50.758 kg). Her temperature is 98.2 F (36.8 C). Her blood pressure is 151/76 and her pulse is 80.    Wt Readings from Last 3 Encounters:  04/27/12 111 lb 14.4 oz (50.758 kg)  04/14/12 113 lb (51.256 kg)  04/08/12 113 lb 4.8 oz (51.393 kg)     Lungs - Normal respiratory effort, chest expands symmetrically. Lungs are clear to auscultation, no crackles or wheezes.  Heart has regular rhythm and rate  Abdomen is soft and non tender with normal bowel sounds  Assessment:  Patient tolerating treatments well, she may require additional imaging of the lumbosacral spine if she continues to have pain in this area.  Plan: Continue treatment per original radiation prescription

## 2012-04-27 NOTE — Progress Notes (Signed)
Patient here for weekly under treat assessment.Completed 6 of 8 treatments to right proximal leg.Pain located in back from lying on treatment table.Patient did take her percocet prior to treatment.Generlaized fatigue as patient already deconditioned prior to treatment.

## 2012-04-28 ENCOUNTER — Encounter: Payer: Self-pay | Admitting: Nutrition

## 2012-04-28 ENCOUNTER — Ambulatory Visit: Payer: No Typology Code available for payment source

## 2012-04-28 ENCOUNTER — Encounter: Payer: Self-pay | Admitting: Dietician

## 2012-04-28 ENCOUNTER — Ambulatory Visit
Admission: RE | Admit: 2012-04-28 | Discharge: 2012-04-28 | Disposition: A | Discharge status: Home / Self Care | Payer: No Typology Code available for payment source | Source: Ambulatory Visit | Attending: Radiation Oncology | Admitting: Radiation Oncology

## 2012-04-28 NOTE — Progress Notes (Signed)
Patient was identified to be at risk on the malnutrition risk screen. Patient's family member returned dietitian phone call. She reports patient has had weight loss, some of it probably fluid losses. She does have nausea and reports Compazine is helpful. She does not care much for ensure. Family member does try to provide small frequent meals for patient but is concerned patient will continue to lose weight.  I educated patient's family member to attempt small frequent, high-calorie, high-protein meals throughout the day. I've recommended patient try [employer]. I educated patient's family member on strategies for eating during nausea. I will mail fact sheets and contact information to patient. Patient's family member appreciative and agrees to contact for further questions or concerns.

## 2012-04-28 NOTE — Progress Notes (Signed)
Brief Out-patient Oncology Nutrition Note  Reason: Patient Screened Positive For Nutrition Risk For Unintentional Weight Loss and Decreased Appetite.   [name] is a 71 year old female patient of Dr. Shirline Frees, diagnosed with lung cancer. Attempted to contact patient via telephone for positive nutrition risk. Left patient voicemail with RD contact information.   Wt Readings from Last 10 Encounters:  04/27/12 111 lb 14.4 oz (50.758 kg)  04/14/12 113 lb (51.256 kg)  04/08/12 113 lb 4.8 oz (51.393 kg)  03/17/12 112 lb 14.4 oz (51.211 kg)  03/01/12 115 lb 12.8 oz (52.527 kg)  02/18/12 116 lb 8 oz (52.844 kg)  02/02/12 116 lb 14.4 oz (53.025 kg)  01/19/12 126 lb (57.153 kg)  01/19/12 126 lb (57.153 kg)  01/06/12 127 lb (57.607 kg)    RD available for nutrition needs.   Iven Finn, MS, RD, LDN [phone]

## 2012-04-29 ENCOUNTER — Ambulatory Visit
Admission: RE | Admit: 2012-04-29 | Discharge: 2012-04-29 | Disposition: A | Discharge status: Home / Self Care | Payer: No Typology Code available for payment source | Source: Ambulatory Visit | Attending: Radiation Oncology | Admitting: Radiation Oncology

## 2012-04-29 ENCOUNTER — Ambulatory Visit: Payer: No Typology Code available for payment source

## 2012-05-01 ENCOUNTER — Other Ambulatory Visit: Payer: Self-pay | Admitting: Physician Assistant

## 2012-05-03 ENCOUNTER — Other Ambulatory Visit: Payer: Self-pay | Admitting: Physician Assistant

## 2012-05-05 ENCOUNTER — Other Ambulatory Visit: Payer: No Typology Code available for payment source | Admitting: Lab

## 2012-05-05 ENCOUNTER — Ambulatory Visit: Payer: No Typology Code available for payment source

## 2012-05-05 ENCOUNTER — Encounter (HOSPITAL_COMMUNITY): Payer: Self-pay | Admitting: Emergency Medicine

## 2012-05-05 ENCOUNTER — Emergency Department (HOSPITAL_COMMUNITY): Payer: No Typology Code available for payment source

## 2012-05-05 ENCOUNTER — Other Ambulatory Visit (HOSPITAL_BASED_OUTPATIENT_CLINIC_OR_DEPARTMENT_OTHER): Payer: No Typology Code available for payment source | Admitting: Lab

## 2012-05-05 ENCOUNTER — Ambulatory Visit: Payer: No Typology Code available for payment source | Admitting: Pharmacist

## 2012-05-05 ENCOUNTER — Ambulatory Visit (HOSPITAL_BASED_OUTPATIENT_CLINIC_OR_DEPARTMENT_OTHER): Payer: No Typology Code available for payment source | Admitting: Internal Medicine

## 2012-05-05 ENCOUNTER — Telehealth: Payer: Self-pay | Admitting: Medical Oncology

## 2012-05-05 ENCOUNTER — Emergency Department (HOSPITAL_COMMUNITY)
Admission: EM | Admit: 2012-05-05 | Discharge: 2012-05-05 | Disposition: A | Discharge status: Home / Self Care | Payer: No Typology Code available for payment source | Attending: Emergency Medicine | Admitting: Emergency Medicine

## 2012-05-05 VITALS — BP 167/86 | HR 79 | Temp 97.0°F | Resp 18 | Ht 64.0 in | Wt 108.9 lb

## 2012-05-05 DIAGNOSIS — I2699 Other pulmonary embolism without acute cor pulmonale: Secondary | ICD-10-CM

## 2012-05-05 DIAGNOSIS — Z8739 Personal history of other diseases of the musculoskeletal system and connective tissue: Secondary | ICD-10-CM | POA: Insufficient documentation

## 2012-05-05 DIAGNOSIS — C7951 Secondary malignant neoplasm of bone: Secondary | ICD-10-CM

## 2012-05-05 DIAGNOSIS — J449 Chronic obstructive pulmonary disease, unspecified: Secondary | ICD-10-CM | POA: Insufficient documentation

## 2012-05-05 DIAGNOSIS — Z8781 Personal history of (healed) traumatic fracture: Secondary | ICD-10-CM | POA: Insufficient documentation

## 2012-05-05 DIAGNOSIS — C3490 Malignant neoplasm of unspecified part of unspecified bronchus or lung: Secondary | ICD-10-CM

## 2012-05-05 DIAGNOSIS — Z85828 Personal history of other malignant neoplasm of skin: Secondary | ICD-10-CM | POA: Insufficient documentation

## 2012-05-05 DIAGNOSIS — F17200 Nicotine dependence, unspecified, uncomplicated: Secondary | ICD-10-CM | POA: Insufficient documentation

## 2012-05-05 DIAGNOSIS — R112 Nausea with vomiting, unspecified: Secondary | ICD-10-CM

## 2012-05-05 DIAGNOSIS — J4489 Other specified chronic obstructive pulmonary disease: Secondary | ICD-10-CM | POA: Insufficient documentation

## 2012-05-05 DIAGNOSIS — Z8673 Personal history of transient ischemic attack (TIA), and cerebral infarction without residual deficits: Secondary | ICD-10-CM | POA: Insufficient documentation

## 2012-05-05 DIAGNOSIS — K5900 Constipation, unspecified: Secondary | ICD-10-CM | POA: Insufficient documentation

## 2012-05-05 DIAGNOSIS — Z9889 Other specified postprocedural states: Secondary | ICD-10-CM | POA: Insufficient documentation

## 2012-05-05 DIAGNOSIS — Z7901 Long term (current) use of anticoagulants: Secondary | ICD-10-CM | POA: Insufficient documentation

## 2012-05-05 DIAGNOSIS — C799 Secondary malignant neoplasm of unspecified site: Secondary | ICD-10-CM

## 2012-05-05 DIAGNOSIS — Z8679 Personal history of other diseases of the circulatory system: Secondary | ICD-10-CM | POA: Insufficient documentation

## 2012-05-05 DIAGNOSIS — I10 Essential (primary) hypertension: Secondary | ICD-10-CM | POA: Insufficient documentation

## 2012-05-05 DIAGNOSIS — Z923 Personal history of irradiation: Secondary | ICD-10-CM | POA: Insufficient documentation

## 2012-05-05 DIAGNOSIS — M4120 Other idiopathic scoliosis, site unspecified: Secondary | ICD-10-CM | POA: Insufficient documentation

## 2012-05-05 DIAGNOSIS — C3410 Malignant neoplasm of upper lobe, unspecified bronchus or lung: Secondary | ICD-10-CM

## 2012-05-05 LAB — CBC WITH DIFFERENTIAL/PLATELET
BASO%: 0.7 % (ref 0.0–2.0)
Basophils Absolute: 0 10*3/uL (ref 0.0–0.1)
EOS%: 3.4 % (ref 0.0–7.0)
HGB: 13.2 g/dL (ref 11.6–15.9)
MCH: 32.1 pg (ref 25.1–34.0)
MCHC: 34.5 g/dL (ref 31.5–36.0)
MCV: 93.1 fL (ref 79.5–101.0)
MONO%: 9.4 % (ref 0.0–14.0)
NEUT%: 80 % — ABNORMAL HIGH (ref 38.4–76.8)
RDW: 14.1 % (ref 11.2–14.5)

## 2012-05-05 LAB — PROTIME-INR: INR: 4.8 — ABNORMAL HIGH (ref 2.00–3.50)

## 2012-05-05 LAB — URINALYSIS, ROUTINE W REFLEX MICROSCOPIC
Glucose, UA: NEGATIVE mg/dL
Specific Gravity, Urine: 1.015 (ref 1.005–1.030)

## 2012-05-05 LAB — COMPREHENSIVE METABOLIC PANEL (CC13)
AST: 25 U/L (ref 5–34)
Alkaline Phosphatase: 171 U/L — ABNORMAL HIGH (ref 40–150)
BUN: 10 mg/dL (ref 7.0–26.0)
Creatinine: 0.6 mg/dL (ref 0.6–1.1)
Potassium: 3.2 mEq/L — ABNORMAL LOW (ref 3.5–5.1)

## 2012-05-05 LAB — ABO/RH: ABO/RH(D): O POS

## 2012-05-05 LAB — URINE MICROSCOPIC-ADD ON

## 2012-05-05 LAB — TROPONIN I: Troponin I: <0.3 ng/mL (ref <0.30)

## 2012-05-05 LAB — TYPE AND SCREEN
ABO/RH(D): O POS
Antibody Screen: NEGATIVE

## 2012-05-05 MED ORDER — ONDANSETRON HCL 4 MG/2ML IJ SOLN
4.0000 mg | Freq: Once | INTRAMUSCULAR | Status: AC
  Administered 2012-05-05: 4 mg via INTRAVENOUS
  Filled 2012-05-05: qty 2

## 2012-05-05 MED ORDER — SODIUM CHLORIDE 0.9 % IV SOLN
1000.0000 mL | Freq: Once | INTRAVENOUS | Status: AC
  Administered 2012-05-05: 1000 mL via INTRAVENOUS

## 2012-05-05 MED ORDER — HYDROMORPHONE HCL PF 1 MG/ML IJ SOLN
1.0000 mg | Freq: Once | INTRAMUSCULAR | Status: AC
  Administered 2012-05-05: 1 mg via INTRAVENOUS
  Filled 2012-05-05: qty 1

## 2012-05-05 MED ORDER — HEPARIN SOD (PORK) LOCK FLUSH 100 UNIT/ML IV SOLN
INTRAVENOUS | Status: AC
  Filled 2012-05-05: qty 5

## 2012-05-05 MED ORDER — IOHEXOL 300 MG/ML  SOLN
100.0000 mL | Freq: Once | INTRAMUSCULAR | Status: AC | PRN
  Administered 2012-05-05: 100 mL via INTRAVENOUS

## 2012-05-05 MED ORDER — SODIUM CHLORIDE 0.9 % IV SOLN: 1000.0000 mL | INTRAVENOUS | Status: DC

## 2012-05-05 MED ORDER — PEG 3350-KCL-NABCB-NACL-NASULF 236 G PO SOLR: 240.0000 mL | Freq: Once | ORAL | Status: AC | PRN

## 2012-05-05 MED ORDER — PSYLLIUM 28 % PO PACK: 1.0000 | PACK | Freq: Two times a day (BID) | ORAL | Status: DC

## 2012-05-05 NOTE — ED Notes (Signed)
Pt SATS 83%. Alaina RN notified.

## 2012-05-05 NOTE — Patient Instructions (Signed)
Pt has been instructed to call [facility]madin Clinic if she still is unable to eat/drink anything reliably by Friday.  We may need to hold her Coumadin for a longer period of time if her clinical situation with her bowels remains unchanged.  She has been sent to the ER direct from clinic to have her bowel issues assessed.

## 2012-05-05 NOTE — Patient Instructions (Signed)
You complained today of constipation and now with nausea and vomiting questionable for small bowel obstruction. I would translate into the emergency Department for further evaluation and consideration of admission for management of her condition.

## 2012-05-05 NOTE — Telephone Encounter (Signed)
Pt sent to ED in wheelchair accompanied by niece. I called ED to give report but could not get in touch with triage nurse -was put on hold for too long.

## 2012-05-05 NOTE — ED Notes (Signed)
Pt states that her bowels have not moved in 11 days.  C/o back pain worse than her normal.  Pt has hx of COPD, lung cancer, with mets to femur and abdomen.  Pt's Sp02 is 80% on RA in triage.

## 2012-05-05 NOTE — ED Notes (Signed)
Patient transported to CT 

## 2012-05-05 NOTE — ED Provider Notes (Signed)
History     CSN: [id number] Arrival date & time 05/05/12  1031 First MD Initiated Contact with Patient 05/05/12 1050      Chief Complaint  Patient presents with  . Constipation  . Lung Cancer   . Sp02 of 80%     HPI Patient presents to the emergency room with a chief complaint of constipation. She has not been able to have a normal bowel movement for the last 11 days. Patient does have history of COPD and lung cancer that has metastasized.  She has been seeing her oncologist who tried her on MiraLAX. She tried additional medications yesterday but will they did not work. The patient saw her oncologist this morning who sent her to the emergency room for further evaluation. The patient has been having trouble with abdominal pain associated with her cancer but this is not particularly different. She is having increasing back pain now which is new. She has had some trouble now with nausea and vomiting as well. She denies prior abdominal surgeries has never had a bowel obstruction in the past. Patient does have history of COPD and was noted to have an oxygen saturation of 83% at triage. She denies feeling any more short of breath than usual. She wears oxygen but states she is only using it at night.  The patient had lab tests drawn this morning at the cancer clinic the results are listed below Past Medical History  Diagnosis Date  . Hypertension   . Heart murmur   . Hip fracture, left   . COPD (chronic obstructive pulmonary disease)   . Skin cancer   . Skin cancer   . 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 Scoliosis 11/01/11    l3  . History of chemotherapy     6 cycles carboplatin/paclitaxel,last dose 08/2006,again given 07/22/2010,alimta q 3weeks s/p 9 cycless  . History of radiation therapy 08/10/2007-09/07/2007    right paratracheal area/ Dr.Kinard, [place]  . History of radiation therapy 2007    bilateral sterotatic radiotherapy Dr[facility]McMullen at [facility] completed  11/25/2005  . Anemia   . S/P radiation therapy 12/04/11 - 01/19/12    [facility] : Right Femur/3000 cGy/10 Fractions and Right Iliac Bone / 3500 cGy/14 Fractions  . Bilateral pulmonary embolism   . CVA (cerebral infarction) Documented 02/18/12    Lovenox  . SOB (shortness of breath) on exertion Documented 02/18/12  . Pain   . Lung cancer 09/18/2005    rul =[facility] dx  . Cancer 11/01/11     mets rightfemur//iliac 5.6cm,&left iliac    Past Surgical History  Procedure Date  . Portacath placement   . Hip surgery     LEFT HIP  . Femur im nail 11/13/2011    Procedure: INTRAMEDULLARY (IM) NAIL FEMORAL;  Surgeon: Senaida Lange, MD;  Location: [facility] OR;  Service: Orthopedics;  Laterality: Right;  IM NAILING OF RIGHT HIP    Family History  Problem Relation Age of Onset  . Breast cancer Paternal Grandmother   . Prostate cancer Maternal Uncle     brainand colon ca  . Heart attack Mother 62    History  Substance Use Topics  . Smoking status: Current Every Day Smoker -- 0.5 packs/day for 50 years    Types: Cigarettes  . Smokeless tobacco: Not on file  . Alcohol Use: No    OB History    Grav Para Term Preterm Abortions TAB SAB Ect Mult Living  Review of Systems  All other systems reviewed and are negative.    Allergies  Other  Home Medications   Current Outpatient Rx  Name Route Sig Dispense Refill  . BISOPROLOL FUMARATE 5 MG PO TABS Oral Take 2.5 mg by mouth daily.     . ERLOTINIB 150 MG PO TABS Oral Take 1 tablet (150 mg total) by mouth daily. Take on an empty stomach 1 hour before meals or 2 hours after. 30 tablet 1  . FUROSEMIDE 20 MG PO TABS Oral Take 1 tablet (20 mg total) by mouth 2 (two) times daily. For edema 30 tablet 0  . LISINOPRIL 20 MG PO TABS Oral Take 20 mg by mouth daily.      . OXYCODONE HCL ER 15 MG PO TB12 Oral Take 1 tablet (15 mg total) by mouth every 12 (twelve) hours. 30 tablet 0  . OXYCODONE-ACETAMINOPHEN 5-325 MG PO TABS Oral Take 1 tablet by  mouth every 4 (four) hours as needed. take 1-2 every 4-6hrs    . PROCHLORPERAZINE MALEATE 10 MG PO TABS Oral Take 1 tablet (10 mg total) by mouth every 6 (six) hours as needed. For nausea 30 tablet 0  . TIOTROPIUM BROMIDE MONOHYDRATE 18 MCG IN CAPS Inhalation Place 18 mcg into inhaler and inhale daily.    . WARFARIN SODIUM 5 MG PO TABS Oral Take 2.5 mg by mouth daily.       BP 160/79  Pulse 85  Temp 98.2 F (36.8 C) (Oral)  Resp 18  SpO2 99%  Physical Exam  Nursing note and vitals reviewed. Constitutional: She appears well-developed and well-nourished. No distress.  HENT:  Head: Normocephalic and atraumatic.  Right Ear: External ear normal.  Left Ear: External ear normal.  Eyes: Conjunctivae normal are normal. Right eye exhibits no discharge. Left eye exhibits no discharge. No scleral icterus.  Neck: Neck supple. No tracheal deviation present.  Cardiovascular: Normal rate, regular rhythm and intact distal pulses.   Pulmonary/Chest: Effort normal and breath sounds normal. No stridor. No respiratory distress. She has no wheezes. She has no rales.  Abdominal: Soft. Bowel sounds are normal. She exhibits no distension and no mass. There is no tenderness. There is no rebound and no guarding.  Genitourinary: Rectal exam shows tenderness. Rectal exam shows no fissure and no mass.       Small amount of superficial perianal skin breakdown, no fecal impaction appreciated on rectal exam  Musculoskeletal: She exhibits no edema and no tenderness.       Pain with range of motion  Neurological: She is alert. She has normal strength. No sensory deficit. Cranial nerve deficit:  no gross defecits noted. She exhibits normal muscle tone. She displays no seizure activity. Coordination normal.  Skin: Skin is warm and dry. No rash noted.  Psychiatric: She has a normal mood and affect.    ED Course  Procedures (including critical care time) Results for orders placed in visit on 05/05/12 (from the past 24  hour(s))  PROTIME-INR     Status: Abnormal   Collection Time   05/05/12  8:23 AM      Component Value Range   Protime 57.6 (*) 10.6 - 13.4 Seconds   INR 4.80 (*) 2.00 - 3.50   Lovenox No     Narrative:    Check with each coumadin clinic visitPerformed At:  [facility]               [address]  [place], [place] [address]  CBC WITH DIFFERENTIAL     Status: Abnormal   Collection Time   05/05/12  8:23 AM      Component Value Range   WBC 6.9  3.9 - 10.3 10e3/uL   NEUT# 5.5  1.5 - 6.5 10e3/uL   HGB 13.2  11.6 - 15.9 g/dL   HCT 60.4  54.0 - 98.1 %   Platelets 317  145 - 400 10e3/uL   MCV 93.1  79.5 - 101.0 fL   MCH 32.1  25.1 - 34.0 pg   MCHC 34.5  31.5 - 36.0 g/dL   RBC 1.91  4.78 - 2.95 10e6/uL   RDW 14.1  11.2 - 14.5 %   lymph# 0.5 (*) 0.9 - 3.3 10e3/uL   MONO# 0.7  0.1 - 0.9 10e3/uL   Eosinophils Absolute 0.2  0.0 - 0.5 10e3/uL   Basophils Absolute 0.0  0.0 - 0.1 10e3/uL   NEUT% 80.0 (*) 38.4 - 76.8 %   LYMPH% 6.5 (*) 14.0 - 49.7 %   MONO% 9.4  0.0 - 14.0 %   EOS% 3.4  0.0 - 7.0 %   BASO% 0.7  0.0 - 2.0 %   Narrative:    Performed At:  [facility]               [address]               [place], [place] [address]  COMPREHENSIVE METABOLIC PANEL (CC13)     Status: Abnormal   Collection Time   05/05/12  8:23 AM      Component Value Range   Sodium 141  136 - 145 mEq/L   Potassium 3.2 (*) 3.5 - 5.1 mEq/L   Chloride 83 (*) 98 - 107 mEq/L   CO2 43 Repeated and Verified (*) 22 - 29 mEq/L   Glucose 110 (*) 70 - 99 mg/dl   BUN 86.5  7.0 - 78.4 mg/dL   Creatinine 0.6  0.6 - 1.1 mg/dL   Total Bilirubin 6.96 (*) 0.20 - 1.20 mg/dL   Alkaline Phosphatase 171 (*) 40 - 150 U/L   AST 25  5 - 34 U/L   ALT 12  0 - 55 U/L   Total Protein 7.2  6.4 - 8.3 g/dL   Albumin 3.5  3.5 - 5.0 g/dL   Calcium 29.5 (*) 8.4 - 10.4 mg/dL   Narrative:    Performed At:  [facility]               [address]               [place], [place] [address]    Labs Reviewed  URINALYSIS, ROUTINE W REFLEX MICROSCOPIC - Abnormal; Notable for the following:    Hgb urine dipstick SMALL (*)     All other components within normal limits  URINE MICROSCOPIC-ADD ON - Abnormal; Notable for the following:    Bacteria, UA FEW (*)     All other components within normal limits  LIPASE, BLOOD  TYPE AND SCREEN  TROPONIN I  ABO/RH   Dg Chest 1 View  05/05/2012  *RADIOLOGY REPORT*  Clinical Data: Lung cancer, bone cancer, COPD.  CHEST - 1 VIEW  Comparison: None.  Findings: Plate-like linear densities in the perihilar regions are appreciated on this single lateral view.  Heart is normal size. Moderate compression fracture in the lower thoracic spine.  This is new since recent chest CT.  IMPRESSION: New lower thoracic compression fracture.   Original Report Authenticated By: Cyndie Chime, M.D.    Ct Abdomen Pelvis W Contrast  05/05/2012  *RADIOLOGY REPORT*  Clinical Data: Metastatic lung carcinoma, receiving chemotherapy. History of prior radiation therapy.  Bony metastatic disease.  New back abdominal pain, constipation  CT ABDOMEN AND PELVIS WITH CONTRAST  Technique:  Multidetector CT imaging of the abdomen and pelvis was performed following the standard protocol during bolus administration of intravenous contrast.  Contrast: OMNIPAQUE IOHEXOL 300 MG/ML  SOLN  Comparison: 04/09/2012  Findings: Stable bilateral lower lobe pulmonary nodules without significant change in size or number compatible with pulmonary metastases.  Right base atelectasis versus scarring noted.  Stable mild cardiac enlargement.  No pericardial or pleural effusion. Atherosclerosis of the lower thoracic aorta with calcification.  Abdomen:  Stable 5 mm tiny cyst in the left hepatic lobe lateral segment, image 20.  Stable minimal intrahepatic biliary prominence and common bile duct prominence without obstruction pattern. Hepatic and portal veins are patent.  No other definite or new hepatic  abnormality.  Gallbladder, biliary system, pancreas, spleen, and kidneys are within normal limits for age and demonstrate no significant interval change.  Small sub centimeter right renal cyst again noted.  No renal obstruction or hydronephrosis.  Atherosclerosis noted of the abdominal aorta with mild ectasia and tortuosity.  No significant aneurysm, dissection or occlusive process.  The adrenal glands redemonstrates diffuse nodular hypodense enlargement dating back to 01/30 /2012.  Index left adrenal measurement is 3.0 x 2.1 cm.  Negative for bowel obstruction, dilatation, or ileus.  Large amount retained stool throughout the entire colon compatible with constipation.  No abdominal free fluid, fluid collection, hemorrhage, or adenopathy.  Pelvis:  Urinary bladder unremarkable.  Stable 4 cm low density cystic lesion in the left adnexa, suspect ovarian cyst.  Uterus is atrophic.  No free fluid, fluid collection, hemorrhage, adenopathy, inguinal abnormality, or hernia.  Atherosclerotic changes of the iliac vessels.  Postoperative changes of both hips with fixation hardware noted. Bones appear osteopenic.   Reconstruction images demonstrate superior endplate compression fractures of L3 and T12 without destructive osseous lesions, suspect osteoporotic fractures.  No significant retropulsion.  Stable mixed destructive osseous lesion in the right ilium as before measuring 5.9 x 4.9 cm, image 38.  Stable sclerotic osseous metastasis of the right femoral neck with a chronic pathologic fracture appearance.  No new osseous lesions appreciated.  IMPRESSION: New interval superior endplate compression fractures of L3 and T12.  Bilateral lower lobe pulmonary metastases, stable.  Stable osseous metastases  Chronic constipation  Stable low attenuation bilateral adrenal enlargement  Stable 4 cm left ovarian cyst  No significant or acute interval change   Original Report Authenticated By: Judie Petit. Ruel Favors, M.D.    Dg Abd Acute  W/chest  05/05/2012  *RADIOLOGY REPORT*  Clinical Data: He is in the constipation, lung cancer, 5 cm.  ACUTE ABDOMEN SERIES (ABDOMEN 2 VIEW & CHEST 1 VIEW)  Comparison: 04/09/2012  Findings: Linear densities in both perihilar regions are again noted, unchanged.  Right Port-A-Cath remains in place, stable. Mild cardiomegaly. There is hyperinflation of the lungs compatible with COPD.  No acute infiltrates or effusions.  A nonobstructive bowel gas pattern.  Moderate stool burden throughout the colon.  No organomegaly.  No free air.  Severe leftward scoliosis in the lumbar spine with associated degenerative changes.  Hardware noted in the proximal femurs bilaterally.  IMPRESSION: Moderate stool burden.  No obstruction or free air.  COPD, cardiomegaly.  Stable linear densities in the perihilar regions bilaterally.   Original Report Authenticated By: Cyndie Chime, M.D.      1. Constipation   2. Metastatic cancer       MDM  The patient has no evidence of bowel obstruction or impaction on CT scan. She does appear to have chronic constipation which is consistent with her symptoms. Patient does have trouble with back pain and this could relate to the superior endplate compression fractures noted on L3 and 12. Patient has been taking oxycodone for pain and this is likely contributing to her issues with constipation. I will discharge her home on GoLYTELY and psyllium.  She is instructed to followup with her primary doctor or oncologist        Celene Kras, MD 05/05/12 [id number]

## 2012-05-05 NOTE — Progress Notes (Signed)
INR supratherapeutic today (4.8) on 2.5mg  daily.   Pt has been unable to keep any significant quantity of food/water down for ~1 week.  She has not had a bowel movement in 11 days.  Her belly is visibly distended today.  She complains that she is hungry, but vomits anytime she tries to eat/drink. No problems with bleeding or bruising.  Has one pressure sore on her backside that intermittently oozes a small amount of blood. No changes in meds. Pt to be sent to ER after seen in clinic today to assess bowel problems.  Elevated INR likely r/t acute illness.  Will have pt hold Coumadin x 2 days, then resume at 2.5mg  daily.  Recheck INR on Monday (5 days).  Pt knows to call if her clinical situation does not improve or worsens.

## 2012-05-05 NOTE — Progress Notes (Signed)
[facility] [phone]   [phone]  OFFICE PROGRESS NOTE  [name], MD [address] [place] [address]  DIAGNOSIS: Recurrent non-small cell lung cancer, adenocarcinoma, initially diagnosed with synchronous stage I involving the right upper lobe and left upper lobe in January 2007.   PRIOR THERAPY:  1. Status post bilateral stereotactic radiotherapy under the care of Dr. Abelardo Diesel at [facility] completed Nov 25, 2005. 2. Status post 6 cycles of systemic chemotherapy with carboplatin and paclitaxel for disease recurrence. Last dose given August 27, 2006 discontinued as the patient had disease stabilization. 3. Status post 6 more cycles of systemic chemotherapy for disease progression with carboplatin and paclitaxel, last dose given July 22, 2010 when the patient had disease stabilization and the treatment was discontinued at that time. 4. Systemic chemotherapy with Alimta at 500 mg/m2 given every 3 weeks status post 9 cycles. 5. Palliative radiotherapy to the metastatic lesion at the right proximal femur.  CURRENT THERAPY:  1. Tarceva 150 mg by mouth daily-status post approximately 2 months of therapy 2. Coumadin 5 mg by mouth daily or as currently directed by the [facility] Coumadin clinic  INTERVAL HISTORY: [name] 71 y.o. female returns to the clinic today for followup visit accompanied by her niece. The patient has been complaining of constipation for the last 11 days now with nausea and vomiting. She tried several medications at home including Miralax and magnesium citrate with no improvement. She is currently off treatment with chemotherapy and Tarceva. She denied having any significant fever or chills. She denied having any significant chest pain, shortness of breath, cough or hemoptysis. She continues to have low back pain and occasional right-sided hip pain. She is currently on pain medication in  the form of OxyContin 50 mg by mouth every 12 hours in addition to oxycodone when necessary. The patient has poor appetite and sleepy most of the time.   MEDICAL HISTORY: Past Medical History  Diagnosis Date  . Hypertension   . Heart murmur   . Hip fracture, left   . COPD (chronic obstructive pulmonary disease)   . Skin cancer   . Skin cancer   . 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 Scoliosis 11/01/11    l3  . History of chemotherapy     6 cycles carboplatin/paclitaxel,last dose 08/2006,again given 07/22/2010,alimta q 3weeks s/p 9 cycless  . History of radiation therapy 08/10/2007-09/07/2007    right paratracheal area/ Dr.Kinard, [place]  . History of radiation therapy 2007    bilateral sterotatic radiotherapy Dr.McMullen at [facility] completed 11/25/2005  . Anemia   . S/P radiation therapy 12/04/11 - 01/19/12    [facility] : Right Femur/3000 cGy/10 Fractions and Right Iliac Bone / 3500 cGy/14 Fractions  . Bilateral pulmonary embolism   . CVA (cerebral infarction) Documented 02/18/12    Lovenox  . SOB (shortness of breath) on exertion Documented 02/18/12  . Pain   . Lung cancer 09/18/2005    rul =[facility] dx  . Cancer 11/01/11     mets rightfemur//iliac 5.6cm,&left iliac    ALLERGIES:  is allergic to other.  MEDICATIONS:  Current Outpatient Prescriptions  Medication Sig Dispense Refill  . bisoprolol (ZEBETA) 5 MG tablet Take 2.5 mg by mouth daily.       Marland Kitchen erlotinib (TARCEVA) 150 MG tablet Take 1 tablet (150 mg total) by mouth daily. Take on an empty stomach 1  hour before meals or 2 hours after.  30 tablet  1  . furosemide (LASIX) 20 MG tablet Take 1 tablet (20 mg total) by mouth 2 (two) times daily. For edema  30 tablet  0  . lisinopril (PRINIVIL,ZESTRIL) 20 MG tablet Take 20 mg by mouth daily.        Marland Kitchen oxyCODONE (OXYCONTIN) 15 MG TB12 Take 1 tablet (15 mg total) by mouth every 12 (twelve) hours.  30 tablet  0  . oxyCODONE-acetaminophen (PERCOCET) 5-325 MG per tablet  Take 1 tablet by mouth every 4 (four) hours as needed. take 1-2 every 4-6hrs      . prochlorperazine (COMPAZINE) 10 MG tablet Take 1 tablet (10 mg total) by mouth every 6 (six) hours as needed. For nausea  30 tablet  0  . tiotropium (SPIRIVA) 18 MCG inhalation capsule Place 18 mcg into inhaler and inhale daily.      Marland Kitchen warfarin (COUMADIN) 5 MG tablet Take 2.5 mg by mouth daily.       Marland Kitchen DISCONTD: warfarin (COUMADIN) 5 MG tablet TAKE ONE TABLET BY MOUTH DAILY BETWEEN 4:00 AND 6:00 PM, OR AS DIRECTED  50 tablet  0   No current facility-administered medications for this visit.   Facility-Administered Medications Ordered in Other Visits  Medication Dose Route Frequency Provider Last Rate Last Dose  . heparin lock flush 100 unit/mL  500 Units Intravenous Once Si Gaul, MD      . sodium chloride 0.9 % injection 10 mL  10 mL Intravenous PRN Si Gaul, MD        SURGICAL HISTORY:  Past Surgical History  Procedure Date  . Portacath placement   . Hip surgery     LEFT HIP  . Femur im nail 11/13/2011    Procedure: INTRAMEDULLARY (IM) NAIL FEMORAL;  Surgeon: Senaida Lange, MD;  Location: [facility] OR;  Service: Orthopedics;  Laterality: Right;  IM NAILING OF RIGHT HIP    REVIEW OF SYSTEMS:  A comprehensive review of systems was negative except for: Constitutional: positive for anorexia, fatigue and weight loss Gastrointestinal: positive for abdominal pain, constipation, nausea and vomiting   PHYSICAL EXAMINATION: General appearance: alert, cooperative and no distress Head: Normocephalic, without obvious abnormality, atraumatic Neck: no adenopathy Lymph nodes: Cervical, supraclavicular, and axillary nodes normal. Resp: clear to auscultation bilaterally Cardio: regular rate and rhythm, S1, S2 normal, no murmur, click, rub or gallop GI: Distended, firm and mildly tender to palpation Extremities: extremities normal, atraumatic, no cyanosis or edema  ECOG PERFORMANCE STATUS: 2 - Symptomatic,  <50% confined to bed  There were no vitals taken for this visit.  LABORATORY DATA: Lab Results  Component Value Date   WBC 6.9 05/05/2012   HGB 13.2 05/05/2012   HCT 38.2 05/05/2012   MCV 93.1 05/05/2012   PLT 317 05/05/2012      Chemistry      Component Value Date/Time   NA 141 04/14/2012 1129   NA 140 02/18/2012 1125   NA 144 08/21/2011 1131   K 3.4* 04/14/2012 1129   K 4.7 02/18/2012 1125   K 5.1* 08/21/2011 1131   CL 93* 04/14/2012 1129   CL 97 02/18/2012 1125   CL 94* 08/21/2011 1131   CO2 35* 04/14/2012 1129   CO2 34* 02/18/2012 1125   CO2 33 08/21/2011 1131   BUN 8.0 04/14/2012 1129   BUN 11 02/18/2012 1125   BUN 14 08/21/2011 1131   CREATININE 0.6 04/14/2012 1129   CREATININE 0.46* 02/18/2012 1125  CREATININE 0.5* 08/21/2011 1131      Component Value Date/Time   CALCIUM 10.2 04/14/2012 1129   CALCIUM 9.8 02/18/2012 1125   CALCIUM 9.7 08/21/2011 1131   ALKPHOS 166* 04/14/2012 1129   ALKPHOS 103 02/18/2012 1125   ALKPHOS 108* 08/21/2011 1131   AST 19 04/14/2012 1129   AST 16 02/18/2012 1125   AST 23 08/21/2011 1131   ALT 12 04/14/2012 1129   ALT 9 02/18/2012 1125   BILITOT 1.70* 04/14/2012 1129   BILITOT 1.2 02/18/2012 1125   BILITOT 0.70 08/21/2011 1131      ASSESSMENT: This is a very pleasant 71 years old white female with recurrent non-small cell lung cancer, adenocarcinoma with a recent disease progression on treatment with Tarceva. She has been off treatment for the last 3 weeks thinking about her next option regarding systemic chemotherapy versus palliative care and hospice. The patient had constipation for the last 11 days now with nausea and vomiting questionable for bowel obstruction.  PLAN: I have a lengthy discussion with the patient today about her condition. I recommended for her to go to the emergency Department for further evaluation and to rule out small bowel obstruction. She had not made a decision regarding her treatment plan with her to proceed with systemic  chemotherapy or consider palliative care and hospice. We will wait until improvement in her general condition before discussing these options again.  All questions were answered. The patient knows to call the clinic with any problems, questions or concerns. We can certainly see the patient much sooner if necessary.

## 2012-05-10 ENCOUNTER — Other Ambulatory Visit (HOSPITAL_BASED_OUTPATIENT_CLINIC_OR_DEPARTMENT_OTHER): Payer: No Typology Code available for payment source | Admitting: Lab

## 2012-05-10 ENCOUNTER — Ambulatory Visit: Payer: No Typology Code available for payment source | Admitting: Pharmacist

## 2012-05-10 DIAGNOSIS — I2699 Other pulmonary embolism without acute cor pulmonale: Secondary | ICD-10-CM

## 2012-05-10 LAB — PROTIME-INR: Protime: 18 Seconds — ABNORMAL HIGH (ref 10.6–13.4)

## 2012-05-10 NOTE — Progress Notes (Signed)
INR is below goal of 2-3.  Pt no longer constipated and is eating normally again.  Just resumed Coumadin yesterday.  Will continue Coumadin 2.5mg  daily and check PT/INR next week.  I suggested pt trying sennokot-s to prevent constipation if miralax is not working.  Pt also stated that she has an oozing sore on bottom and I encouraged her to have checked at next MD visit.

## 2012-05-13 ENCOUNTER — Encounter: Payer: Self-pay | Admitting: Radiation Oncology

## 2012-05-13 ENCOUNTER — Ambulatory Visit
Admission: RE | Admit: 2012-05-13 | Discharge: 2012-05-13 | Disposition: A | Discharge status: Home / Self Care | Payer: No Typology Code available for payment source | Source: Ambulatory Visit | Attending: Radiation Oncology | Admitting: Radiation Oncology

## 2012-05-13 VITALS — BP 144/64 | HR 77 | Temp 98.4°F | Wt 107.7 lb

## 2012-05-13 DIAGNOSIS — C7952 Secondary malignant neoplasm of bone marrow: Secondary | ICD-10-CM

## 2012-05-13 MED ORDER — SILVER SULFADIAZINE 1 % EX CREA: TOPICAL_CREAM | Freq: Two times a day (BID) | CUTANEOUS | Status: DC

## 2012-05-13 NOTE — Progress Notes (Signed)
Radiation Oncology         [phone] ________________________________  Name: [name] MRN: [id number]  Date: 05/13/2012  DOB: 1941-02-24  Follow-Up Visit Note  CC: [name], MD  Si Gaul, MD  Diagnosis:   Metastatic non-small cell lung cancer  Interval Since Last Radiation:  2 weeks  Narrative:  The patient returns today for routine follow-up.  Overall she has had improvement in her pain in the right proximal leg area. She has noticed shrinkage of the soft tissue mass. She does complain of irritation in the peri-buttocks area.                           ALLERGIES:  is allergic to other.  Meds: Current Outpatient Prescriptions  Medication Sig Dispense Refill  . bisoprolol (ZEBETA) 5 MG tablet Take 2.5 mg by mouth daily.       Marland Kitchen erlotinib (TARCEVA) 150 MG tablet Take 1 tablet (150 mg total) by mouth daily. Take on an empty stomach 1 hour before meals or 2 hours after.  30 tablet  1  . furosemide (LASIX) 20 MG tablet Take 1 tablet (20 mg total) by mouth 2 (two) times daily. For edema  30 tablet  0  . lisinopril (PRINIVIL,ZESTRIL) 20 MG tablet Take 20 mg by mouth daily.        Marland Kitchen oxyCODONE (OXYCONTIN) 15 MG TB12 Take 1 tablet (15 mg total) by mouth every 12 (twelve) hours.  30 tablet  0  . oxyCODONE-acetaminophen (PERCOCET) 5-325 MG per tablet Take 1 tablet by mouth every 4 (four) hours as needed. take 1-2 every 4-6hrs      . polyethylene glycol (GOLYTELY) 236 G solution Take 240 mLs by mouth once as needed. Constipation, can repeat in 1 hour if no response  4000 mL  0  . prochlorperazine (COMPAZINE) 10 MG tablet Take 1 tablet (10 mg total) by mouth every 6 (six) hours as needed. For nausea  30 tablet  0  . psyllium (METAMUCIL SMOOTH TEXTURE) 28 % packet Take 1 packet by mouth 2 (two) times daily.  30 packet  1  . tiotropium (SPIRIVA) 18 MCG inhalation capsule Place 18 mcg into inhaler and inhale daily.      Marland Kitchen warfarin (COUMADIN) 5 MG tablet Take 2.5 mg by mouth  daily.        Current Facility-Administered Medications  Medication Dose Route Frequency Provider Last Rate Last Dose  . silver sulfADIAZINE (SILVADENE) 1 % cream   Topical BID Billie Lade, MD       Facility-Administered Medications Ordered in Other Encounters  Medication Dose Route Frequency Provider Last Rate Last Dose  . heparin lock flush 100 unit/mL  500 Units Intravenous Once Si Gaul, MD      . sodium chloride 0.9 % injection 10 mL  10 mL Intravenous PRN Si Gaul, MD        Physical Findings: The patient is in no acute distress. Patient is alert and oriented.  weight is 107 lb 11.2 oz (48.852 kg). Her temperature is 98.4 F (36.9 C). Her blood pressure is 144/64 and her pulse is 77. Her oxygen saturation is 89%. .  The soft tissue mass in the right proximal thigh is smaller on exam today.  Patient has erythema in the period buttocks area and possibly some mild moist desquamation.  Lab Findings: Lab Results  Component Value Date   WBC 6.9 05/05/2012   HGB 13.2 05/05/2012  HCT 38.2 05/05/2012   MCV 93.1 05/05/2012   PLT 317 05/05/2012    @LASTCHEM @  Radiographic Findings: Dg Chest 1 View  05/05/2012  *RADIOLOGY REPORT*  Clinical Data: Lung cancer, bone cancer, COPD.  CHEST - 1 VIEW  Comparison: None.  Findings: Plate-like linear densities in the perihilar regions are appreciated on this single lateral view.  Heart is normal size. Moderate compression fracture in the lower thoracic spine.  This is new since recent chest CT.  IMPRESSION: New lower thoracic compression fracture.   Original Report Authenticated By: Cyndie Chime, M.D.    Ct Abdomen Pelvis W Contrast  05/05/2012  *RADIOLOGY REPORT*  Clinical Data: Metastatic lung carcinoma, receiving chemotherapy. History of prior radiation therapy.  Bony metastatic disease.  New back abdominal pain, constipation  CT ABDOMEN AND PELVIS WITH CONTRAST  Technique:  Multidetector CT imaging of the abdomen and pelvis  was performed following the standard protocol during bolus administration of intravenous contrast.  Contrast: OMNIPAQUE IOHEXOL 300 MG/ML  SOLN  Comparison: 04/09/2012  Findings: Stable bilateral lower lobe pulmonary nodules without significant change in size or number compatible with pulmonary metastases.  Right base atelectasis versus scarring noted.  Stable mild cardiac enlargement.  No pericardial or pleural effusion. Atherosclerosis of the lower thoracic aorta with calcification.  Abdomen:  Stable 5 mm tiny cyst in the left hepatic lobe lateral segment, image 20.  Stable minimal intrahepatic biliary prominence and common bile duct prominence without obstruction pattern. Hepatic and portal veins are patent.  No other definite or new hepatic abnormality.  Gallbladder, biliary system, pancreas, spleen, and kidneys are within normal limits for age and demonstrate no significant interval change.  Small sub centimeter right renal cyst again noted.  No renal obstruction or hydronephrosis.  Atherosclerosis noted of the abdominal aorta with mild ectasia and tortuosity.  No significant aneurysm, dissection or occlusive process.  The adrenal glands redemonstrates diffuse nodular hypodense enlargement dating back to 01/30 /2012.  Index left adrenal measurement is 3.0 x 2.1 cm.  Negative for bowel obstruction, dilatation, or ileus.  Large amount retained stool throughout the entire colon compatible with constipation.  No abdominal free fluid, fluid collection, hemorrhage, or adenopathy.  Pelvis:  Urinary bladder unremarkable.  Stable 4 cm low density cystic lesion in the left adnexa, suspect ovarian cyst.  Uterus is atrophic.  No free fluid, fluid collection, hemorrhage, adenopathy, inguinal abnormality, or hernia.  Atherosclerotic changes of the iliac vessels.  Postoperative changes of both hips with fixation hardware noted. Bones appear osteopenic.   Reconstruction images demonstrate superior endplate compression  fractures of L3 and T12 without destructive osseous lesions, suspect osteoporotic fractures.  No significant retropulsion.  Stable mixed destructive osseous lesion in the right ilium as before measuring 5.9 x 4.9 cm, image 38.  Stable sclerotic osseous metastasis of the right femoral neck with a chronic pathologic fracture appearance.  No new osseous lesions appreciated.  IMPRESSION: New interval superior endplate compression fractures of L3 and T12.  Bilateral lower lobe pulmonary metastases, stable.  Stable osseous metastases  Chronic constipation  Stable low attenuation bilateral adrenal enlargement  Stable 4 cm left ovarian cyst  No significant or acute interval change   Original Report Authenticated By: Judie Petit. Ruel Favors, M.D.    Dg Abd Acute W/chest  05/05/2012  *RADIOLOGY REPORT*  Clinical Data: He is in the constipation, lung cancer, 5 cm.  ACUTE ABDOMEN SERIES (ABDOMEN 2 VIEW & CHEST 1 VIEW)  Comparison: 04/09/2012  Findings: Linear densities in  both perihilar regions are again noted, unchanged.  Right Port-A-Cath remains in place, stable. Mild cardiomegaly. There is hyperinflation of the lungs compatible with COPD.  No acute infiltrates or effusions.  A nonobstructive bowel gas pattern.  Moderate stool burden throughout the colon.  No organomegaly.  No free air.  Severe leftward scoliosis in the lumbar spine with associated degenerative changes.  Hardware noted in the proximal femurs bilaterally.  IMPRESSION: Moderate stool burden.  No obstruction or free air.  COPD, cardiomegaly.  Stable linear densities in the perihilar regions bilaterally.   Original Report Authenticated By: Cyndie Chime, M.D.     Impression:  The patient is recovering from the effects of radiation.  She has had shrinkage of her soft tissue mass in the right proximal thigh  as well as improvement in  her pain.  For the patient's perirectal irritation she has been given Silvadene.  Plan:  When necessary followup. The patient  will continue followup in medical oncology and may resume chemotherapy.  _____________________________________   Billie Lade, PhD, MD

## 2012-05-13 NOTE — Progress Notes (Signed)
Patient here for routine follow up completion of radiation to bone.Pain level "6" but has improved.Has buttock irritation/redness which is sore.Looks more like treatment irritation versus pressure sore.Will have Dr.Kinard to assess.

## 2012-05-17 ENCOUNTER — Telehealth: Payer: Self-pay | Admitting: Internal Medicine

## 2012-05-17 ENCOUNTER — Ambulatory Visit (HOSPITAL_BASED_OUTPATIENT_CLINIC_OR_DEPARTMENT_OTHER): Payer: No Typology Code available for payment source | Admitting: Pharmacist

## 2012-05-17 ENCOUNTER — Other Ambulatory Visit (HOSPITAL_BASED_OUTPATIENT_CLINIC_OR_DEPARTMENT_OTHER): Payer: No Typology Code available for payment source | Admitting: Lab

## 2012-05-17 ENCOUNTER — Telehealth: Payer: Self-pay | Admitting: *Deleted

## 2012-05-17 DIAGNOSIS — I2699 Other pulmonary embolism without acute cor pulmonale: Secondary | ICD-10-CM

## 2012-05-17 DIAGNOSIS — Z7901 Long term (current) use of anticoagulants: Secondary | ICD-10-CM

## 2012-05-17 DIAGNOSIS — C3490 Malignant neoplasm of unspecified part of unspecified bronchus or lung: Secondary | ICD-10-CM

## 2012-05-17 LAB — POCT INR: INR: 3

## 2012-05-17 LAB — PROTIME-INR: Protime: 36 Seconds — ABNORMAL HIGH (ref 10.6–13.4)

## 2012-05-17 NOTE — Progress Notes (Signed)
Will continue coumadin 2.5mg  daily. Recheck INR in 10 days on Nov 19 at 2:30 for lab and 2:45 Coumadin Clinic.

## 2012-05-17 NOTE — Patient Instructions (Signed)
Continue coumadin 2.5mg  daily. Recheck INR in 10 days on Nov 19 at 2:30 for lab and 2:45 Coumadin Clinic.

## 2012-05-17 NOTE — Telephone Encounter (Signed)
appts made and printed for pt aom °

## 2012-05-17 NOTE — Telephone Encounter (Signed)
Received call from Marylu Lund that pt needs to schedule f/u with Dr Donnald Garre to discuss further options of chemo.  Per Dr Donnald Garre okay to make f/u appt.  SLJ

## 2012-05-18 ENCOUNTER — Encounter: Payer: Self-pay | Admitting: *Deleted

## 2012-05-18 ENCOUNTER — Other Ambulatory Visit (HOSPITAL_BASED_OUTPATIENT_CLINIC_OR_DEPARTMENT_OTHER): Payer: No Typology Code available for payment source | Admitting: Lab

## 2012-05-18 ENCOUNTER — Ambulatory Visit (HOSPITAL_BASED_OUTPATIENT_CLINIC_OR_DEPARTMENT_OTHER): Payer: No Typology Code available for payment source | Admitting: Internal Medicine

## 2012-05-18 VITALS — BP 169/86 | HR 87 | Temp 97.5°F | Resp 20 | Ht 64.0 in | Wt 106.5 lb

## 2012-05-18 DIAGNOSIS — C3490 Malignant neoplasm of unspecified part of unspecified bronchus or lung: Secondary | ICD-10-CM

## 2012-05-18 DIAGNOSIS — C7952 Secondary malignant neoplasm of bone marrow: Secondary | ICD-10-CM

## 2012-05-18 DIAGNOSIS — C3410 Malignant neoplasm of upper lobe, unspecified bronchus or lung: Secondary | ICD-10-CM

## 2012-05-18 LAB — COMPREHENSIVE METABOLIC PANEL (CC13)
ALT: 11 U/L (ref 0–55)
Albumin: 3 g/dL — ABNORMAL LOW (ref 3.5–5.0)
CO2: 42 mEq/L — ABNORMAL HIGH (ref 22–29)
Calcium: 10.3 mg/dL (ref 8.4–10.4)
Chloride: 89 mEq/L — ABNORMAL LOW (ref 98–107)
Potassium: 3.1 mEq/L — ABNORMAL LOW (ref 3.5–5.1)
Sodium: 138 mEq/L (ref 136–145)
Total Protein: 7 g/dL (ref 6.4–8.3)

## 2012-05-18 LAB — CBC WITH DIFFERENTIAL/PLATELET
BASO%: 0.7 % (ref 0.0–2.0)
HCT: 37.1 % (ref 34.8–46.6)
MCHC: 34.5 g/dL (ref 31.5–36.0)
MONO#: 0.7 10*3/uL (ref 0.1–0.9)
NEUT%: 76.7 % (ref 38.4–76.8)
RBC: 3.96 10*6/uL (ref 3.70–5.45)
RDW: 14.3 % (ref 11.2–14.5)
WBC: 6.2 10*3/uL (ref 3.9–10.3)
lymph#: 0.5 10*3/uL — ABNORMAL LOW (ref 0.9–3.3)

## 2012-05-18 NOTE — Progress Notes (Signed)
[facility] [phone]   [phone]  OFFICE PROGRESS NOTE  [name], MD [address] [place] [address]  DIAGNOSIS: Recurrent non-small cell lung cancer, adenocarcinoma, initially diagnosed with synchronous stage I involving the right upper lobe and left upper lobe in January 2007.   PRIOR THERAPY:  1. Status post bilateral stereotactic radiotherapy under the care of Dr. Abelardo Diesel at [facility] completed Nov 25, 2005. 2. Status post 6 cycles of systemic chemotherapy with carboplatin and paclitaxel for disease recurrence. Last dose given August 27, 2006 discontinued as the patient had disease stabilization. 3. Status post 6 more cycles of systemic chemotherapy for disease progression with carboplatin and paclitaxel, last dose given July 22, 2010 when the patient had disease stabilization and the treatment was discontinued at that time. 4. Systemic chemotherapy with Alimta at 500 mg/m2 given every 3 weeks status post 9 cycles. 5. Palliative radiotherapy to the metastatic lesion at the right proximal femur.  CURRENT THERAPY:  1. Tarceva 150 mg by mouth daily-status post approximately 2 months of therapy 2. Coumadin 5 mg by mouth daily or as currently directed by the [facility] Coumadin clinic  INTERVAL HISTORY: [name] 71 y.o. female returns to the clinic today for followup visit accompanied by her niece. The patient is feeling a little bit better today. Her constipation has significantly improved. She still complaining of back pain and currently on OxyContin 20 mg by mouth every 12 hours in addition to Percocet on as-needed basis. She denied having any significant fever or chills. She has no nausea or vomiting. She denied having any significant chest pain but continues to have shortness breath with exertion, no cough or hemoptysis.   MEDICAL HISTORY: Past Medical History  Diagnosis Date  . Hypertension    . Heart murmur   . Hip fracture, left   . COPD (chronic obstructive pulmonary disease)   . Skin cancer   . Skin cancer   . 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 Scoliosis 11/01/11    l3  . History of chemotherapy     6 cycles carboplatin/paclitaxel,last dose 08/2006,again given 07/22/2010,alimta q 3weeks s/p 9 cycless  . History of radiation therapy 08/10/2007-09/07/2007    right paratracheal area/ Dr.Kinard, [place]  . History of radiation therapy 2007    bilateral sterotatic radiotherapy Dr[facility]McMullen at [facility] completed 11/25/2005  . Anemia   . S/P radiation therapy 12/04/11 - 01/19/12    [facility] : Right Femur/3000 cGy/10 Fractions and Right Iliac Bone / 3500 cGy/14 Fractions  . Bilateral pulmonary embolism   . CVA (cerebral infarction) Documented 02/18/12    Lovenox  . SOB (shortness of breath) on exertion Documented 02/18/12  . Pain   . Lung cancer 09/18/2005    rul =[facility] dx  . Cancer 11/01/11     mets rightfemur//iliac 5.6cm,&left iliac  . History of radiation therapy 04/2012    bone    ALLERGIES:  is allergic to other.  MEDICATIONS:  Current Outpatient Prescriptions  Medication Sig Dispense Refill  . bisoprolol (ZEBETA) 5 MG tablet Take 2.5 mg by mouth daily.       Marland Kitchen erlotinib (TARCEVA) 150 MG tablet Take 1 tablet (150 mg total) by mouth daily. Take on an empty stomach 1 hour before meals or 2 hours after.  30 tablet  1  . furosemide (LASIX) 20 MG tablet Take 1 tablet (20 mg total) by  mouth 2 (two) times daily. For edema  30 tablet  0  . lisinopril (PRINIVIL,ZESTRIL) 20 MG tablet Take 20 mg by mouth daily.        Marland Kitchen oxyCODONE (OXYCONTIN) 15 MG TB12 Take 1 tablet (15 mg total) by mouth every 12 (twelve) hours.  30 tablet  0  . oxyCODONE-acetaminophen (PERCOCET) 5-325 MG per tablet Take 1 tablet by mouth every 4 (four) hours as needed. take 1-2 every 4-6hrs      . polyethylene glycol (GOLYTELY) 236 G solution Take 240 mLs by mouth once as needed.  Constipation, can repeat in 1 hour if no response  4000 mL  0  . prochlorperazine (COMPAZINE) 10 MG tablet Take 1 tablet (10 mg total) by mouth every 6 (six) hours as needed. For nausea  30 tablet  0  . psyllium (METAMUCIL SMOOTH TEXTURE) 28 % packet Take 1 packet by mouth 2 (two) times daily.  30 packet  1  . tiotropium (SPIRIVA) 18 MCG inhalation capsule Place 18 mcg into inhaler and inhale daily.      Marland Kitchen warfarin (COUMADIN) 5 MG tablet Take 2.5 mg by mouth daily.        No current facility-administered medications for this visit.   Facility-Administered Medications Ordered in Other Visits  Medication Dose Route Frequency Provider Last Rate Last Dose  . heparin lock flush 100 unit/mL  500 Units Intravenous Once Si Gaul, MD      . sodium chloride 0.9 % injection 10 mL  10 mL Intravenous PRN Si Gaul, MD        SURGICAL HISTORY:  Past Surgical History  Procedure Date  . Portacath placement   . Hip surgery     LEFT HIP  . Femur im nail 11/13/2011    Procedure: INTRAMEDULLARY (IM) NAIL FEMORAL;  Surgeon: Senaida Lange, MD;  Location: [facility] OR;  Service: Orthopedics;  Laterality: Right;  IM NAILING OF RIGHT HIP    REVIEW OF SYSTEMS:  A comprehensive review of systems was negative except for: Constitutional: positive for anorexia and fatigue Respiratory: positive for dyspnea on exertion Musculoskeletal: positive for back pain   PHYSICAL EXAMINATION: General appearance: alert, cooperative, fatigued and no distress Head: Normocephalic, without obvious abnormality, atraumatic Neck: no adenopathy Lymph nodes: Cervical, supraclavicular, and axillary nodes normal. Resp: clear to auscultation bilaterally Cardio: regular rate and rhythm, S1, S2 normal, no murmur, click, rub or gallop GI: soft, non-tender; bowel sounds normal; no masses,  no organomegaly Extremities: extremities normal, atraumatic, no cyanosis or edema  ECOG PERFORMANCE STATUS: 2 - Symptomatic, <50% confined to  bed  Blood pressure 169/86, pulse 87, temperature 97.5 F (36.4 C), temperature source Oral, resp. rate 20, height 5\' 4"  (1.626 m), weight 106 lb 8 oz (48.308 kg).  LABORATORY DATA: Lab Results  Component Value Date   WBC 6.2 05/18/2012   HGB 12.8 05/18/2012   HCT 37.1 05/18/2012   MCV 93.5 05/18/2012   PLT 276 05/18/2012      Chemistry      Component Value Date/Time   NA 141 05/05/2012 0823   NA 140 02/18/2012 1125   NA 144 08/21/2011 1131   K 3.2* 05/05/2012 0823   K 4.7 02/18/2012 1125   K 5.1* 08/21/2011 1131   CL 83* 05/05/2012 0823   CL 97 02/18/2012 1125   CL 94* 08/21/2011 1131   CO2 43 Repeated and Verified* 05/05/2012 0823   CO2 34* 02/18/2012 1125   CO2 33 08/21/2011 1131   BUN 10.0  05/05/2012 0823   BUN 11 02/18/2012 1125   BUN 14 08/21/2011 1131   CREATININE 0.6 05/05/2012 0823   CREATININE 0.46* 02/18/2012 1125   CREATININE 0.5* 08/21/2011 1131      Component Value Date/Time   CALCIUM 10.9* 05/05/2012 0823   CALCIUM 9.8 02/18/2012 1125   CALCIUM 9.7 08/21/2011 1131   ALKPHOS 171* 05/05/2012 0823   ALKPHOS 103 02/18/2012 1125   ALKPHOS 108* 08/21/2011 1131   AST 25 05/05/2012 0823   AST 16 02/18/2012 1125   AST 23 08/21/2011 1131   ALT 12 05/05/2012 0823   ALT 9 02/18/2012 1125   BILITOT 1.71* 05/05/2012 0823   BILITOT 1.2 02/18/2012 1125   BILITOT 0.70 08/21/2011 1131       RADIOGRAPHIC STUDIES: Dg Chest 1 View  05/05/2012  *RADIOLOGY REPORT*  Clinical Data: Lung cancer, bone cancer, COPD.  CHEST - 1 VIEW  Comparison: None.  Findings: Plate-like linear densities in the perihilar regions are appreciated on this single lateral view.  Heart is normal size. Moderate compression fracture in the lower thoracic spine.  This is new since recent chest CT.  IMPRESSION: New lower thoracic compression fracture.   Original Report Authenticated By: Cyndie Chime, M.D.    Ct Abdomen Pelvis W Contrast  05/05/2012  *RADIOLOGY REPORT*  Clinical Data: Metastatic lung carcinoma,  receiving chemotherapy. History of prior radiation therapy.  Bony metastatic disease.  New back abdominal pain, constipation  CT ABDOMEN AND PELVIS WITH CONTRAST  Technique:  Multidetector CT imaging of the abdomen and pelvis was performed following the standard protocol during bolus administration of intravenous contrast.  Contrast: OMNIPAQUE IOHEXOL 300 MG/ML  SOLN  Comparison: 04/09/2012  Findings: Stable bilateral lower lobe pulmonary nodules without significant change in size or number compatible with pulmonary metastases.  Right base atelectasis versus scarring noted.  Stable mild cardiac enlargement.  No pericardial or pleural effusion. Atherosclerosis of the lower thoracic aorta with calcification.  Abdomen:  Stable 5 mm tiny cyst in the left hepatic lobe lateral segment, image 20.  Stable minimal intrahepatic biliary prominence and common bile duct prominence without obstruction pattern. Hepatic and portal veins are patent.  No other definite or new hepatic abnormality.  Gallbladder, biliary system, pancreas, spleen, and kidneys are within normal limits for age and demonstrate no significant interval change.  Small sub centimeter right renal cyst again noted.  No renal obstruction or hydronephrosis.  Atherosclerosis noted of the abdominal aorta with mild ectasia and tortuosity.  No significant aneurysm, dissection or occlusive process.  The adrenal glands redemonstrates diffuse nodular hypodense enlargement dating back to 01/30 /2012.  Index left adrenal measurement is 3.0 x 2.1 cm.  Negative for bowel obstruction, dilatation, or ileus.  Large amount retained stool throughout the entire colon compatible with constipation.  No abdominal free fluid, fluid collection, hemorrhage, or adenopathy.  Pelvis:  Urinary bladder unremarkable.  Stable 4 cm low density cystic lesion in the left adnexa, suspect ovarian cyst.  Uterus is atrophic.  No free fluid, fluid collection, hemorrhage, adenopathy, inguinal  abnormality, or hernia.  Atherosclerotic changes of the iliac vessels.  Postoperative changes of both hips with fixation hardware noted. Bones appear osteopenic.   Reconstruction images demonstrate superior endplate compression fractures of L3 and T12 without destructive osseous lesions, suspect osteoporotic fractures.  No significant retropulsion.  Stable mixed destructive osseous lesion in the right ilium as before measuring 5.9 x 4.9 cm, image 38.  Stable sclerotic osseous metastasis of the right femoral neck  with a chronic pathologic fracture appearance.  No new osseous lesions appreciated.  IMPRESSION: New interval superior endplate compression fractures of L3 and T12.  Bilateral lower lobe pulmonary metastases, stable.  Stable osseous metastases  Chronic constipation  Stable low attenuation bilateral adrenal enlargement  Stable 4 cm left ovarian cyst  No significant or acute interval change   Original Report Authenticated By: Judie Petit. Ruel Favors, M.D.    Dg Abd Acute W/chest  05/05/2012  *RADIOLOGY REPORT*  Clinical Data: He is in the constipation, lung cancer, 5 cm.  ACUTE ABDOMEN SERIES (ABDOMEN 2 VIEW & CHEST 1 VIEW)  Comparison: 04/09/2012  Findings: Linear densities in both perihilar regions are again noted, unchanged.  Right Port-A-Cath remains in place, stable. Mild cardiomegaly. There is hyperinflation of the lungs compatible with COPD.  No acute infiltrates or effusions.  A nonobstructive bowel gas pattern.  Moderate stool burden throughout the colon.  No organomegaly.  No free air.  Severe leftward scoliosis in the lumbar spine with associated degenerative changes.  Hardware noted in the proximal femurs bilaterally.  IMPRESSION: Moderate stool burden.  No obstruction or free air.  COPD, cardiomegaly.  Stable linear densities in the perihilar regions bilaterally.   Original Report Authenticated By: Cyndie Chime, M.D.     ASSESSMENT: This is a very pleasant 71 years old white female with  metastatic non-small cell lung cancer most recently treated with oral Tarceva with evidence for disease progression.   PLAN: I have a lengthy discussion with the patient and her niece today about her current disease status, prognosis and treatment options. I again gave her the option of referral to palliative care and hospice versus systemic chemotherapy with single agent gemcitabine, docetaxel or Navelbine. I discussed with the patient the treatment options and the adverse effects in details. The patient would like to consider systemic chemotherapy. I recommended for her regimen of gemcitabine with reduced dose 800 mg/M2 on days 1 and 8 every 3 weeks. I discussed with the patient adverse effect of this treatment including but not limited to alopecia, myelosuppression, nausea vomiting, peripheral neuropathy, liver or renal dysfunction. The patient would like to proceed with treatment as planned. She is expected to start the first cycle of this treatment next week. She would come back for followup visit in 2 weeks for reevaluation and management any adverse effects of her chemotherapy.  All questions were answered. The patient knows to call the clinic with any problems, questions or concerns. We can certainly see the patient much sooner if necessary.  I spent 15 minutes counseling the patient face to face. The total time spent in the appointment was 25 minutes.

## 2012-05-18 NOTE — Progress Notes (Signed)
Quick Note:  Call patient with the result and order K Dur 20 meq po qd X 7 days ______ 

## 2012-05-18 NOTE — Patient Instructions (Addendum)
Your previous scan showed evidence for disease progression. We discussed treatment options including palliative care and hospice referral versus systemic chemotherapy with gemcitabine. You elected to proceed with systemic chemotherapy. First cycle to start next week.

## 2012-05-18 NOTE — Progress Notes (Signed)
RECEIVED A FAX FROM [facility] CONCERNING A CONFIRMATION OF PRESCRIPTION SHIPMENT FOR TARCEVA ON 05/17/12.

## 2012-05-19 ENCOUNTER — Telehealth: Payer: Self-pay | Admitting: Medical Oncology

## 2012-05-19 MED ORDER — POTASSIUM CHLORIDE CRYS ER 20 MEQ PO TBCR: 20.0000 meq | EXTENDED_RELEASE_TABLET | Freq: Every day | ORAL | Status: DC

## 2012-05-19 NOTE — Telephone Encounter (Signed)
Message copied by Charma Igo on Wed May 19, 2012  9:17 AM ------      Message from: Si Gaul      Created: Tue May 18, 2012 10:50 PM       Call patient with the result and order K Dur 20 meq po qd X 7 days.

## 2012-05-19 NOTE — Telephone Encounter (Signed)
Called in kdur to pharmacy and pt. 

## 2012-05-20 ENCOUNTER — Telehealth: Payer: Self-pay | Admitting: Medical Oncology

## 2012-05-20 NOTE — Telephone Encounter (Signed)
[facility] was told by family that pt no longer taking tarceva. I confirmed this as dictated in note

## 2012-05-21 ENCOUNTER — Telehealth: Payer: Self-pay | Admitting: Internal Medicine

## 2012-05-21 NOTE — Telephone Encounter (Signed)
S/w pt's niece Junious Dresser re next appt for 11/18 and pt to get new schedule when she comes in. Moved 11/19 lb/CC appt to 11/18 to coord w/lb-tx. Ok per [place]. Pt niece aware.

## 2012-05-21 NOTE — Telephone Encounter (Signed)
Pt called and left message, called pt back left message regarding chemo on 05/24/12

## 2012-05-22 ENCOUNTER — Encounter: Payer: Self-pay | Admitting: Radiation Oncology

## 2012-05-22 NOTE — Progress Notes (Signed)
  Radiation Oncology         [phone] ________________________________  Name: [name] MRN: [id number]  Date: 05/22/2012  DOB: [birth year]  End of Treatment Note  Diagnosis:   Metastatic lung cancer     Indication for treatment:  Painful osseous and soft tissue metastasis       Radiation treatment dates:   04/20/2012 through 04/29/2012  Site/dose:   Right proximal leg, 2400 cGy in 8 fractions  Beams/energy:   AP PA using 6 MV photons  Narrative: The patient tolerated radiation treatment relatively well.   During the last week of her treatment she had noticed some improvement in her pain.  Plan: The patient has completed radiation treatment. The patient will return to radiation oncology clinic for routine followup in one month. I advised them to call or return sooner if they have any questions or concerns related to their recovery or treatment.  -----------------------------------  Billie Lade, PhD, MD

## 2012-05-24 ENCOUNTER — Other Ambulatory Visit: Payer: Self-pay | Admitting: Medical Oncology

## 2012-05-24 ENCOUNTER — Encounter (HOSPITAL_COMMUNITY): Payer: Self-pay

## 2012-05-24 ENCOUNTER — Other Ambulatory Visit: Payer: Self-pay

## 2012-05-24 ENCOUNTER — Inpatient Hospital Stay (HOSPITAL_COMMUNITY): Payer: No Typology Code available for payment source

## 2012-05-24 ENCOUNTER — Other Ambulatory Visit (HOSPITAL_BASED_OUTPATIENT_CLINIC_OR_DEPARTMENT_OTHER): Payer: No Typology Code available for payment source | Admitting: Lab

## 2012-05-24 ENCOUNTER — Emergency Department (HOSPITAL_COMMUNITY): Payer: No Typology Code available for payment source

## 2012-05-24 ENCOUNTER — Inpatient Hospital Stay (HOSPITAL_COMMUNITY)
Admission: EM | Admit: 2012-05-24 | Discharge: 2012-05-28 | DRG: 312 | Disposition: A | Discharge status: Hospice | Payer: No Typology Code available for payment source | Attending: Internal Medicine | Admitting: Internal Medicine

## 2012-05-24 ENCOUNTER — Ambulatory Visit: Payer: No Typology Code available for payment source | Admitting: Pharmacist

## 2012-05-24 ENCOUNTER — Ambulatory Visit (HOSPITAL_BASED_OUTPATIENT_CLINIC_OR_DEPARTMENT_OTHER): Payer: No Typology Code available for payment source

## 2012-05-24 VITALS — BP 71/47 | HR 106 | Temp 98.2°F | Resp 18

## 2012-05-24 DIAGNOSIS — E876 Hypokalemia: Secondary | ICD-10-CM

## 2012-05-24 DIAGNOSIS — J9610 Chronic respiratory failure, unspecified whether with hypoxia or hypercapnia: Secondary | ICD-10-CM | POA: Diagnosis present

## 2012-05-24 DIAGNOSIS — R7989 Other specified abnormal findings of blood chemistry: Secondary | ICD-10-CM | POA: Diagnosis present

## 2012-05-24 DIAGNOSIS — R791 Abnormal coagulation profile: Secondary | ICD-10-CM | POA: Diagnosis present

## 2012-05-24 DIAGNOSIS — Z79899 Other long term (current) drug therapy: Secondary | ICD-10-CM

## 2012-05-24 DIAGNOSIS — Z8673 Personal history of transient ischemic attack (TIA), and cerebral infarction without residual deficits: Secondary | ICD-10-CM

## 2012-05-24 DIAGNOSIS — I2699 Other pulmonary embolism without acute cor pulmonale: Secondary | ICD-10-CM

## 2012-05-24 DIAGNOSIS — D62 Acute posthemorrhagic anemia: Secondary | ICD-10-CM | POA: Diagnosis present

## 2012-05-24 DIAGNOSIS — M47817 Spondylosis without myelopathy or radiculopathy, lumbosacral region: Secondary | ICD-10-CM | POA: Diagnosis present

## 2012-05-24 DIAGNOSIS — Z515 Encounter for palliative care: Secondary | ICD-10-CM

## 2012-05-24 DIAGNOSIS — I959 Hypotension, unspecified: Secondary | ICD-10-CM

## 2012-05-24 DIAGNOSIS — Z66 Do not resuscitate: Secondary | ICD-10-CM | POA: Diagnosis present

## 2012-05-24 DIAGNOSIS — F411 Generalized anxiety disorder: Secondary | ICD-10-CM | POA: Diagnosis present

## 2012-05-24 DIAGNOSIS — I509 Heart failure, unspecified: Secondary | ICD-10-CM | POA: Diagnosis present

## 2012-05-24 DIAGNOSIS — C7951 Secondary malignant neoplasm of bone: Secondary | ICD-10-CM | POA: Diagnosis present

## 2012-05-24 DIAGNOSIS — C3490 Malignant neoplasm of unspecified part of unspecified bronchus or lung: Secondary | ICD-10-CM

## 2012-05-24 DIAGNOSIS — I9589 Other hypotension: Principal | ICD-10-CM | POA: Diagnosis present

## 2012-05-24 DIAGNOSIS — R4182 Altered mental status, unspecified: Secondary | ICD-10-CM | POA: Diagnosis present

## 2012-05-24 DIAGNOSIS — K30 Functional dyspepsia: Secondary | ICD-10-CM

## 2012-05-24 DIAGNOSIS — C7952 Secondary malignant neoplasm of bone marrow: Secondary | ICD-10-CM

## 2012-05-24 DIAGNOSIS — R110 Nausea: Secondary | ICD-10-CM

## 2012-05-24 DIAGNOSIS — S22080A Wedge compression fracture of T11-T12 vertebra, initial encounter for closed fracture: Secondary | ICD-10-CM | POA: Diagnosis present

## 2012-05-24 DIAGNOSIS — Z9221 Personal history of antineoplastic chemotherapy: Secondary | ICD-10-CM

## 2012-05-24 DIAGNOSIS — D499 Neoplasm of unspecified behavior of unspecified site: Secondary | ICD-10-CM

## 2012-05-24 DIAGNOSIS — Z7901 Long term (current) use of anticoagulants: Secondary | ICD-10-CM

## 2012-05-24 DIAGNOSIS — J189 Pneumonia, unspecified organism: Secondary | ICD-10-CM | POA: Diagnosis present

## 2012-05-24 DIAGNOSIS — IMO0002 Reserved for concepts with insufficient information to code with codable children: Secondary | ICD-10-CM

## 2012-05-24 DIAGNOSIS — M84559A Pathological fracture in neoplastic disease, hip, unspecified, initial encounter for fracture: Secondary | ICD-10-CM | POA: Diagnosis present

## 2012-05-24 DIAGNOSIS — Z923 Personal history of irradiation: Secondary | ICD-10-CM

## 2012-05-24 DIAGNOSIS — J449 Chronic obstructive pulmonary disease, unspecified: Secondary | ICD-10-CM

## 2012-05-24 DIAGNOSIS — C3410 Malignant neoplasm of upper lobe, unspecified bronchus or lung: Secondary | ICD-10-CM

## 2012-05-24 DIAGNOSIS — Z9981 Dependence on supplemental oxygen: Secondary | ICD-10-CM

## 2012-05-24 DIAGNOSIS — M545 Low back pain: Secondary | ICD-10-CM

## 2012-05-24 DIAGNOSIS — Z5111 Encounter for antineoplastic chemotherapy: Secondary | ICD-10-CM

## 2012-05-24 DIAGNOSIS — I10 Essential (primary) hypertension: Secondary | ICD-10-CM | POA: Diagnosis present

## 2012-05-24 DIAGNOSIS — F419 Anxiety disorder, unspecified: Secondary | ICD-10-CM | POA: Diagnosis present

## 2012-05-24 DIAGNOSIS — M4120 Other idiopathic scoliosis, site unspecified: Secondary | ICD-10-CM | POA: Diagnosis present

## 2012-05-24 DIAGNOSIS — M8448XA Pathological fracture, other site, initial encounter for fracture: Secondary | ICD-10-CM | POA: Diagnosis present

## 2012-05-24 DIAGNOSIS — T465X5A Adverse effect of other antihypertensive drugs, initial encounter: Secondary | ICD-10-CM | POA: Diagnosis present

## 2012-05-24 DIAGNOSIS — E875 Hyperkalemia: Secondary | ICD-10-CM | POA: Diagnosis not present

## 2012-05-24 DIAGNOSIS — F17200 Nicotine dependence, unspecified, uncomplicated: Secondary | ICD-10-CM | POA: Diagnosis present

## 2012-05-24 DIAGNOSIS — K5900 Constipation, unspecified: Secondary | ICD-10-CM

## 2012-05-24 DIAGNOSIS — G459 Transient cerebral ischemic attack, unspecified: Secondary | ICD-10-CM | POA: Diagnosis present

## 2012-05-24 DIAGNOSIS — C4490 Unspecified malignant neoplasm of skin, unspecified: Secondary | ICD-10-CM | POA: Diagnosis present

## 2012-05-24 DIAGNOSIS — J4489 Other specified chronic obstructive pulmonary disease: Secondary | ICD-10-CM | POA: Diagnosis present

## 2012-05-24 DIAGNOSIS — E43 Unspecified severe protein-calorie malnutrition: Secondary | ICD-10-CM | POA: Diagnosis present

## 2012-05-24 DIAGNOSIS — I5032 Chronic diastolic (congestive) heart failure: Secondary | ICD-10-CM | POA: Diagnosis present

## 2012-05-24 DIAGNOSIS — E860 Dehydration: Secondary | ICD-10-CM | POA: Diagnosis present

## 2012-05-24 DIAGNOSIS — M84453A Pathological fracture, unspecified femur, initial encounter for fracture: Secondary | ICD-10-CM

## 2012-05-24 DIAGNOSIS — R011 Cardiac murmur, unspecified: Secondary | ICD-10-CM | POA: Diagnosis present

## 2012-05-24 DIAGNOSIS — E873 Alkalosis: Secondary | ICD-10-CM | POA: Diagnosis present

## 2012-05-24 DIAGNOSIS — Z86711 Personal history of pulmonary embolism: Secondary | ICD-10-CM

## 2012-05-24 DIAGNOSIS — I639 Cerebral infarction, unspecified: Secondary | ICD-10-CM | POA: Diagnosis present

## 2012-05-24 LAB — CBC WITH DIFFERENTIAL/PLATELET
BASO%: 0.2 % (ref 0.0–2.0)
Basophils Relative: 0 % (ref 0–1)
Basophils Relative: 0 % (ref 0–1)
Eosinophils Relative: 0 % (ref 0–5)
HCT: 38.5 % (ref 34.8–46.6)
HCT: 30.9 % — ABNORMAL LOW (ref 36.0–46.0)
Hemoglobin: 7 g/dL — ABNORMAL LOW (ref 12.0–15.0)
Hemoglobin: 10.6 g/dL — ABNORMAL LOW (ref 12.0–15.0)
LYMPH%: 3.5 % — ABNORMAL LOW (ref 14.0–49.7)
MCH: 31.7 pg (ref 26.0–34.0)
MCHC: 32.7 g/dL (ref 31.5–36.0)
MCHC: 32.1 g/dL (ref 30.0–36.0)
MCV: 93.9 fL (ref 79.5–101.0)
MCV: 92.5 fL (ref 78.0–100.0)
MONO%: 7.8 % (ref 0.0–14.0)
Monocytes Absolute: 0.8 10*3/uL (ref 0.1–1.0)
Monocytes Relative: 6 % (ref 3–12)
NEUT%: 87.2 % — ABNORMAL HIGH (ref 38.4–76.8)
Neutro Abs: 5.3 10*3/uL (ref 1.7–7.7)
Neutro Abs: 8 10*3/uL — ABNORMAL HIGH (ref 1.7–7.7)
Neutrophils Relative %: 91 % — ABNORMAL HIGH (ref 43–77)
Neutrophils Relative %: 89 % — ABNORMAL HIGH (ref 43–77)
Platelets: 346 10*3/uL (ref 145–400)
RBC: 4.1 10*6/uL (ref 3.70–5.45)
RBC: 2.34 MIL/uL — ABNORMAL LOW (ref 3.87–5.11)
RBC: 3.34 MIL/uL — ABNORMAL LOW (ref 3.87–5.11)
WBC Morphology: INCREASED
WBC: 5.8 10*3/uL (ref 4.0–10.5)

## 2012-05-24 LAB — COMPREHENSIVE METABOLIC PANEL
AST: 24 U/L (ref 0–37)
Albumin: 2.3 g/dL — ABNORMAL LOW (ref 3.5–5.2)
Alkaline Phosphatase: 135 U/L — ABNORMAL HIGH (ref 39–117)
Chloride: 94 mEq/L — ABNORMAL LOW (ref 96–112)
Creatinine, Ser: 0.52 mg/dL (ref 0.50–1.10)
Potassium: 3 mEq/L — ABNORMAL LOW (ref 3.5–5.1)
Total Bilirubin: 0.4 mg/dL (ref 0.3–1.2)

## 2012-05-24 LAB — COMPREHENSIVE METABOLIC PANEL (CC13)
Alkaline Phosphatase: 165 U/L — ABNORMAL HIGH (ref 40–150)
Creatinine: 0.7 mg/dL (ref 0.6–1.1)
Glucose: 163 mg/dl — ABNORMAL HIGH (ref 70–99)
Sodium: 139 mEq/L (ref 136–145)
Total Bilirubin: 0.99 mg/dL (ref 0.20–1.20)
Total Protein: 6.5 g/dL (ref 6.4–8.3)

## 2012-05-24 LAB — PROTIME-INR
INR: 5.87 (ref 0.00–1.49)
Prothrombin Time: 48.6 seconds — ABNORMAL HIGH (ref 11.6–15.2)

## 2012-05-24 LAB — TYPE AND SCREEN: Antibody Screen: NEGATIVE

## 2012-05-24 LAB — URINALYSIS, ROUTINE W REFLEX MICROSCOPIC
Leukocytes, UA: NEGATIVE
Nitrite: NEGATIVE
Protein, ur: NEGATIVE mg/dL
Specific Gravity, Urine: 1.009 (ref 1.005–1.030)
Urobilinogen, UA: 1 mg/dL (ref 0.0–1.0)

## 2012-05-24 LAB — URINE MICROSCOPIC-ADD ON

## 2012-05-24 LAB — PROCALCITONIN: Procalcitonin: <0.1 ng/mL

## 2012-05-24 LAB — PHOSPHORUS: Phosphorus: 3.7 mg/dL (ref 2.3–4.6)

## 2012-05-24 LAB — PROTHROMBIN TIME
INR: 5.05 (ref <1.50)
Prothrombin Time: 43.5 seconds — ABNORMAL HIGH (ref 11.6–15.2)

## 2012-05-24 LAB — POCT I-STAT TROPONIN I: Troponin i, poc: 0.13 ng/mL (ref 0.00–0.08)

## 2012-05-24 MED ORDER — SODIUM CHLORIDE 0.9 % IV SOLN
INTRAVENOUS | Status: DC
  Administered 2012-05-24: 18:00:00 via INTRAVENOUS

## 2012-05-24 MED ORDER — WARFARIN - PHARMACIST DOSING INPATIENT: Freq: Every day | Status: DC

## 2012-05-24 MED ORDER — SODIUM CHLORIDE 0.9 % IV SOLN
INTRAVENOUS | Status: DC
  Filled 2012-05-24: qty 1000

## 2012-05-24 MED ORDER — MORPHINE SULFATE 2 MG/ML IJ SOLN
1.0000 mg | INTRAMUSCULAR | Status: DC | PRN
  Administered 2012-05-24 – 2012-05-27 (×6): 1 mg via INTRAVENOUS
  Filled 2012-05-24 (×6): qty 1

## 2012-05-24 MED ORDER — POTASSIUM CHLORIDE CRYS ER 20 MEQ PO TBCR
20.0000 meq | EXTENDED_RELEASE_TABLET | Freq: Two times a day (BID) | ORAL | Status: DC
  Administered 2012-05-24 – 2012-05-26 (×5): 20 meq via ORAL
  Filled 2012-05-24 (×8): qty 1

## 2012-05-24 MED ORDER — NAPROXEN 250 MG PO TABS
250.0000 mg | ORAL_TABLET | Freq: Two times a day (BID) | ORAL | Status: DC | PRN
  Administered 2012-05-25: 250 mg via ORAL
  Filled 2012-05-24: qty 1

## 2012-05-24 MED ORDER — SODIUM CHLORIDE 0.9 % IJ SOLN
10.0000 mL | INTRAMUSCULAR | Status: DC | PRN
  Filled 2012-05-24: qty 10

## 2012-05-24 MED ORDER — SODIUM CHLORIDE 0.9 % IV BOLUS (SEPSIS)
1000.0000 mL | Freq: Once | INTRAVENOUS | Status: AC
  Administered 2012-05-24: 1000 mL via INTRAVENOUS

## 2012-05-24 MED ORDER — MAGNESIUM SULFATE 40 MG/ML IJ SOLN
2.0000 g | Freq: Once | INTRAMUSCULAR | Status: AC
  Administered 2012-05-24: 2 g via INTRAVENOUS
  Filled 2012-05-24: qty 50

## 2012-05-24 MED ORDER — POLYETHYLENE GLYCOL 3350 17 G PO PACK
17.0000 g | PACK | Freq: Every day | ORAL | Status: DC | PRN
  Filled 2012-05-24: qty 1

## 2012-05-24 MED ORDER — FUROSEMIDE 10 MG/ML IJ SOLN
40.0000 mg | Freq: Once | INTRAMUSCULAR | Status: AC
  Administered 2012-05-24: 40 mg via INTRAVENOUS
  Filled 2012-05-24: qty 4

## 2012-05-24 MED ORDER — PROCHLORPERAZINE MALEATE 10 MG PO TABS
10.0000 mg | ORAL_TABLET | Freq: Four times a day (QID) | ORAL | Status: DC | PRN
  Filled 2012-05-24: qty 1

## 2012-05-24 MED ORDER — TIOTROPIUM BROMIDE MONOHYDRATE 18 MCG IN CAPS
18.0000 ug | ORAL_CAPSULE | Freq: Every day | RESPIRATORY_TRACT | Status: DC
  Filled 2012-05-24: qty 5

## 2012-05-24 MED ORDER — ALBUTEROL SULFATE (5 MG/ML) 0.5% IN NEBU: 2.5000 mg | INHALATION_SOLUTION | Freq: Four times a day (QID) | RESPIRATORY_TRACT | Status: DC | PRN

## 2012-05-24 MED ORDER — SODIUM CHLORIDE 0.9 % IV SOLN
800.0000 mg/m2 | Freq: Once | INTRAVENOUS | Status: AC
  Administered 2012-05-24: 1178 mg via INTRAVENOUS
  Filled 2012-05-24: qty 31

## 2012-05-24 MED ORDER — NAPROXEN SODIUM 220 MG PO TABS: 220.0000 mg | ORAL_TABLET | Freq: Two times a day (BID) | ORAL | Status: DC | PRN

## 2012-05-24 MED ORDER — SODIUM CHLORIDE 0.9 % IV SOLN
INTRAVENOUS | Status: DC
  Administered 2012-05-24: 10:00:00 via INTRAVENOUS

## 2012-05-24 MED ORDER — MORPHINE SULFATE 4 MG/ML IJ SOLN
4.0000 mg | Freq: Once | INTRAMUSCULAR | Status: AC
  Administered 2012-05-24: 4 mg via INTRAVENOUS
  Filled 2012-05-24: qty 1

## 2012-05-24 MED ORDER — SODIUM CHLORIDE 0.9 % IV SOLN: INTRAVENOUS | Status: AC

## 2012-05-24 MED ORDER — PEG 3350-KCL-NABCB-NACL-NASULF 236 G PO SOLR
240.0000 mL | Freq: Every day | ORAL | Status: DC | PRN
  Filled 2012-05-24: qty 4000

## 2012-05-24 MED ORDER — IPRATROPIUM BROMIDE 0.02 % IN SOLN
0.5000 mg | Freq: Once | RESPIRATORY_TRACT | Status: AC
  Administered 2012-05-24: 0.5 mg via RESPIRATORY_TRACT
  Filled 2012-05-24: qty 2.5

## 2012-05-24 MED ORDER — IPRATROPIUM BROMIDE 0.02 % IN SOLN: 0.5000 mg | Freq: Four times a day (QID) | RESPIRATORY_TRACT | Status: DC | PRN

## 2012-05-24 MED ORDER — POTASSIUM CHLORIDE CRYS ER 20 MEQ PO TBCR: 20.0000 meq | EXTENDED_RELEASE_TABLET | Freq: Two times a day (BID) | ORAL | Status: DC

## 2012-05-24 MED ORDER — ALBUTEROL SULFATE (5 MG/ML) 0.5% IN NEBU: 5.0000 mg | INHALATION_SOLUTION | RESPIRATORY_TRACT | Status: DC | PRN

## 2012-05-24 MED ORDER — ACETAMINOPHEN 650 MG RE SUPP: 650.0000 mg | Freq: Four times a day (QID) | RECTAL | Status: DC | PRN

## 2012-05-24 MED ORDER — ALBUTEROL SULFATE (5 MG/ML) 0.5% IN NEBU
5.0000 mg | INHALATION_SOLUTION | Freq: Once | RESPIRATORY_TRACT | Status: AC
  Administered 2012-05-24: 5 mg via RESPIRATORY_TRACT
  Filled 2012-05-24: qty 1

## 2012-05-24 MED ORDER — ACETAMINOPHEN 325 MG PO TABS: 650.0000 mg | ORAL_TABLET | Freq: Four times a day (QID) | ORAL | Status: DC | PRN

## 2012-05-24 MED ORDER — SODIUM CHLORIDE 0.9 % IJ SOLN
3.0000 mL | Freq: Two times a day (BID) | INTRAMUSCULAR | Status: DC
  Administered 2012-05-24 – 2012-05-27 (×5): 3 mL via INTRAVENOUS

## 2012-05-24 MED ORDER — VITAMIN K1 10 MG/ML IJ SOLN
5.0000 mg | Freq: Once | INTRAMUSCULAR | Status: DC
  Filled 2012-05-24: qty 0.5

## 2012-05-24 MED ORDER — DEXAMETHASONE SODIUM PHOSPHATE 10 MG/ML IJ SOLN
10.0000 mg | Freq: Once | INTRAMUSCULAR | Status: AC
  Administered 2012-05-24: 10 mg via INTRAVENOUS

## 2012-05-24 MED ORDER — SODIUM CHLORIDE 0.9 % IV SOLN
Freq: Once | INTRAVENOUS | Status: AC
  Administered 2012-05-24: 09:00:00 via INTRAVENOUS

## 2012-05-24 MED ORDER — POTASSIUM CHLORIDE 10 MEQ/100ML IV SOLN
10.0000 meq | INTRAVENOUS | Status: AC
  Administered 2012-05-24 (×2): 10 meq via INTRAVENOUS
  Filled 2012-05-24 (×2): qty 100

## 2012-05-24 MED ORDER — ONDANSETRON HCL 4 MG/2ML IJ SOLN
4.0000 mg | Freq: Once | INTRAMUSCULAR | Status: AC
  Administered 2012-05-24: 4 mg via INTRAVENOUS
  Filled 2012-05-24: qty 2

## 2012-05-24 MED ORDER — IPRATROPIUM BROMIDE 0.02 % IN SOLN: 0.5000 mg | RESPIRATORY_TRACT | Status: DC | PRN

## 2012-05-24 MED ORDER — OXYCODONE HCL ER 15 MG PO T12A
15.0000 mg | EXTENDED_RELEASE_TABLET | Freq: Three times a day (TID) | ORAL | Status: DC
  Administered 2012-05-24 – 2012-05-27 (×8): 15 mg via ORAL
  Filled 2012-05-24 (×8): qty 1

## 2012-05-24 MED ORDER — ONDANSETRON 8 MG/50ML IVPB (CHCC)
8.0000 mg | Freq: Once | INTRAVENOUS | Status: AC
  Administered 2012-05-24: 8 mg via INTRAVENOUS

## 2012-05-24 MED ORDER — HEPARIN SOD (PORK) LOCK FLUSH 100 UNIT/ML IV SOLN
500.0000 [IU] | Freq: Once | INTRAVENOUS | Status: DC | PRN
  Filled 2012-05-24: qty 5

## 2012-05-24 MED ORDER — POTASSIUM CHLORIDE 10 MEQ/100ML IV SOLN
10.0000 meq | INTRAVENOUS | Status: DC
  Administered 2012-05-24: 10 meq via INTRAVENOUS
  Filled 2012-05-24: qty 100

## 2012-05-24 MED ORDER — SODIUM CHLORIDE 0.9 % IV SOLN
INTRAVENOUS | Status: DC
  Administered 2012-05-24: 19:00:00 via INTRAVENOUS

## 2012-05-24 MED ORDER — ONDANSETRON HCL 4 MG/2ML IJ SOLN: 4.0000 mg | Freq: Four times a day (QID) | INTRAMUSCULAR | Status: DC | PRN

## 2012-05-24 MED ORDER — ONDANSETRON HCL 4 MG PO TABS: 4.0000 mg | ORAL_TABLET | Freq: Four times a day (QID) | ORAL | Status: DC | PRN

## 2012-05-24 MED ORDER — OXYCODONE-ACETAMINOPHEN 5-325 MG PO TABS
1.0000 | ORAL_TABLET | ORAL | Status: DC | PRN
  Administered 2012-05-25 (×2): 2 via ORAL
  Administered 2012-05-25: 1 via ORAL
  Administered 2012-05-26 – 2012-05-27 (×4): 2 via ORAL
  Filled 2012-05-24: qty 2
  Filled 2012-05-24: qty 1
  Filled 2012-05-24 (×6): qty 2

## 2012-05-24 MED ORDER — OXYCODONE HCL 15 MG PO TB12: 15.0000 mg | ORAL_TABLET | Freq: Three times a day (TID) | ORAL | Status: DC

## 2012-05-24 NOTE — Progress Notes (Signed)
CRITICAL VALUE ALERT  Critical value received:  INR 5.58  Date of notification: 05/24/12  Time of notification:  2153  Critical value read back:yes  Nurse who received alert:  Reuel Boom RN  MD notified (1st page):  Rolene Course NP  Time of first page:  2210  MD notified (2nd page):  Time of second page:  Responding MD:  Rolene Course NP  Time MD responded: 1610

## 2012-05-24 NOTE — ED Notes (Signed)
Pt was brought over from cancer center with hypotension and hypokalemia of 2.7. Pt has severe back pain as well. Pt was given bolus  At cancer center

## 2012-05-24 NOTE — H&P (Signed)
[facility] History and Physical  GENEE RANN ZOX:[id number] DOB: 06-Dec-1940 DOA: 05/24/2012  Referring physician: ER physician PCP: [name], MD   Chief Complaint: hypotension  HPI:  71 year old female with past medical history of recurrent non-small cell lung cancer, adenocarcinoma (involving the right upper lobe and left upper lobe diagnosed  in January 2007), osseous metastases, current therapy with tarceva, status post recent radiation to right proximal leg for osseous metastases, pulmonary embolism (on coumadin) who was transferred from cancer center for hypotension. Patient is not a good historian as she is very lethargic at this time. Her family provided very limited history saying that the patient has felt weak for past few days but was able to talk and walk. Evaluation in ED revealed hypotension, SBP in 60's slowly responding to 1 L fluid challenge.   Assessment and Plan:  Principal Problem:  *Hypotension  Possible sepsis  SBP now in 90's  Patient is not requiring pressors at this time  We will continue IV fluids, the patient is still receiving 1 L planned normal saline  Check procalcitonin level  I spoke with PCCM to make them aware of this patient and possibility of need for pressors. 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  Management per oncology   COPD (chronic obstructive pulmonary disease)  Patient does not to be in respiratory distress  Nebulizer treatments PRN   Pulmonary embolism  INR supratherapeutic at 5  Will give 1 dose of vitamin K  Hypokalemia  repleted in ED   Follow up BMP in am  Metabolic alkalosis  Secondary to lasix  Code Status: Full Family Communication: Pt at bedside Disposition Plan:  Admit for further evaluation; admission to stepdown unit  Manson Passey, MD  [facility] Pager [phone]  If 7PM-7AM, please contact night-coverage [email or web address] Password [facility] 05/24/2012, 3:49 PM  Review of Systems:  Patient is unable to provide review of systems, lethargic  Past Medical History  Diagnosis Date  . Hypertension   . Heart murmur   . Hip fracture, left   . COPD (chronic obstructive pulmonary disease)   . Skin cancer   . Skin cancer   . 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 Scoliosis 11/01/11    l3  . History of chemotherapy     6 cycles carboplatin/paclitaxel,last dose 08/2006,again given 07/22/2010,alimta q 3weeks s/p 9 cycless  . History of radiation therapy 08/10/2007-09/07/2007    right paratracheal area/ Dr.Kinard, [place]  . History of radiation therapy 2007    bilateral sterotatic radiotherapy Dr[facility]McMullen at [facility] completed 11/25/2005  . Anemia   . S/P radiation therapy 12/04/11 - 01/19/12    [facility] : Right Femur/3000 cGy/10 Fractions and Right Iliac Bone / 3500 cGy/14 Fractions  . Bilateral pulmonary embolism   . CVA (cerebral infarction) Documented 02/18/12    Lovenox  . SOB (shortness of breath) on exertion Documented 02/18/12  . Pain   . Lung cancer 09/18/2005    rul =[facility] dx  . Cancer 11/01/11     mets rightfemur//iliac 5.6cm,&left iliac  . History of radiation therapy 04/2012    bone   Past Surgical History  Procedure Date  . Portacath placement   . Hip surgery     LEFT HIP  .  Femur im nail 11/13/2011    Procedure: INTRAMEDULLARY (IM) NAIL FEMORAL;  Surgeon: Senaida Lange, MD;  Location: [facility] OR;  Service: Orthopedics;  Laterality: Right;  IM NAILING OF RIGHT HIP   Social History:  reports that she has been smoking Cigarettes.  She has a 25 pack-year smoking history. She has never used smokeless tobacco. She reports that she does not drink alcohol or use illicit drugs.  Allergies  Allergen Reactions  . Other Hives    Cheap  jewelry.    Family History: htn in mother  Prior to Admission medications   Medication Sig Start Date End Date Taking? Authorizing Provider  bisoprolol (ZEBETA) 5 MG tablet Take 2.5 mg by mouth daily.    Yes Historical Provider, MD  furosemide (LASIX) 20 MG tablet Take 1 tablet (20 mg total) by mouth 2 (two) times daily. For edema 12/13/11  Yes [name], MD  lisinopril (PRINIVIL,ZESTRIL) 20 MG tablet Take 20 mg by mouth daily.     Yes Lonie Peak, PA  naproxen sodium (ANAPROX) 220 MG tablet Take 220 mg by mouth 2 (two) times daily as needed. For pain   Yes Historical Provider, MD  oxyCODONE (OXYCONTIN) 15 MG TB12 Take 15 mg by mouth every 8 (eight) hours. 12/13/11  Yes [name], MD  oxyCODONE-acetaminophen (PERCOCET) 5-325 MG per tablet Take 1-2 tablets by mouth every 4 (four) hours as needed. For pain 11/13/11  Yes Senaida Lange, MD  potassium chloride SA (K-DUR,KLOR-CON) 20 MEQ tablet Take 20 mEq by mouth 2 (two) times daily. 05/24/12  Yes Si Gaul, MD  prochlorperazine (COMPAZINE) 10 MG tablet Take 1 tablet (10 mg total) by mouth every 6 (six) hours as needed. For nausea 03/29/12  Yes Conni Slipper, PA  tiotropium (SPIRIVA) 18 MCG inhalation capsule Place 18 mcg into inhaler and inhale daily.   Yes Historical Provider, MD  warfarin (COUMADIN) 5 MG tablet Take 2.5 mg by mouth daily.  02/18/12  Yes Conni Slipper, PA  polyethylene glycol (GOLYTELY) 236 G solution Take 240 mLs by mouth once as needed. Constipation, can repeat in 1 hour if no response 05/05/12   Celene Kras, MD   Physical Exam: Filed Vitals:   05/24/12 1300 05/24/12 1330 05/24/12 1340 05/24/12 1400  BP: 110/57 104/59 125/70 121/73  Pulse:  86 86 84  Temp:      TempSrc:      Resp: 22 20 22 22   SpO2:  100% 100% 100%    Physical Exam  Constitutional: Appears ill, no apparent acute distress  HENT: Normocephalic. Dry mucus membranes Eyes: Conjunctivae and EOM are normal. PERRLA, no scleral icterus.    Neck: Normal ROM. Neck supple.  No tracheal deviation. No thyromegaly.  CVS: RRR, S1/S2 +, no murmurs, no gallops, no carotid bruit.  Pulmonary: diminished breath sounds at bases; no wheezing Abdominal: Soft. BS +,  no distension, tenderness, rebound or guarding.  Musculoskeletal: Normal range of motion. (+1-2) LE pitting edema and no tenderness.  Lymphadenopathy: No lymphadenopathy noted, cervical, inguinal. Neuro: no focal neurologic deficits; sleeping Skin: Skin is warm and dry. No rash noted. Not diaphoretic. No erythema. No pallor.    Labs on Admission:  Basic Metabolic Panel:  Lab 05/24/12 0454 05/24/12 0815 05/18/12 1528  NA -- 139 138  K 2.9* 2.2  3.1*  CL -- 87* 89*  CO2 -- 41* 42*  GLUCOSE -- 163* 117*  BUN -- 12.0 7.0  CREATININE -- 0.7 0.6  CALCIUM -- 10.0  10.3   Liver Function Tests:  Lab 05/24/12 0815 05/18/12 1528  AST 20 20  ALT 14 11  ALKPHOS 165* 196*  BILITOT 0.99 1.32*  PROT 6.5 7.0  ALBUMIN 2.9* 3.0*   CBC:  Lab 05/24/12 0813 05/18/12 1528  WBC 9.4 6.2  HGB 12.6 12.8  HCT 38.5 37.1  MCV 93.9 93.5  PLT 346 276    Radiological Exams on Admission: Dg Chest 2 View 05/24/2012  * IMPRESSION: Stable chronic changes as described.  No superimposed acute process.   Original Report Authenticated By: Judie Petit. Shick, M.D.     EKG: Normal sinus rhythm, no ST/T wave changes  Time spent: 100 minutes

## 2012-05-24 NOTE — Progress Notes (Signed)
1000-After hanging Gemzar, pt states she does "not feel right".  States she feels like she is going to pass out and her head hurts.  Gemzar paused.  BP checked, 63/38.  Dr. Arbutus Ped aware.  Per VO Dr. Arbutus Ped- hang normal saline, recheck BP after and if BP improved, will restart Gemzar.  If BP has not improved, will send pt to ED for evaluation-dhp, rn 1045- After completing NS, blood pressure has not significantly improved.  Dr. Arbutus Ped aware, per VO Dr. Arbutus Ped, will transport pt to ED-dhp, rn 1055-Report given to ED RN, pt care turned over to ED staff-dhp, rn

## 2012-05-24 NOTE — Progress Notes (Signed)
INR = 5.05 (sent out for verification) Pt takes Coumadin 2.5 mg daily. She has poor PO intake. She is starting on Gemzar today (days 1 & 8 every 3 weeks). I spoke w/ Dr. Arbutus Ped & he did not want pt to take Vit K at this time.  Instead we will have pt omit 2 days of Coumadin then restart 2.5 mg daily on Wednesday.   Recheck INR this Friday. Marily Lente, Pharm.D.

## 2012-05-24 NOTE — Progress Notes (Signed)
CRITICAL VALUE ALERT  Critical value received:  INR 5.87  Date of notification:  05/24/13  Time of notification:  2030  Critical value read back:yes  Nurse who received alert:  Reuel Boom RN  MD notified (1st page): E-Link  Time of first page: 2049  MD notified (2nd page):  Time of second page:  Responding MD: Dr Frederico Hamman  Time MD responded:  2049

## 2012-05-24 NOTE — Telephone Encounter (Signed)
Called in [place] to pts  Pharmacy and to her neice

## 2012-05-24 NOTE — Progress Notes (Signed)
ANTICOAGULATION CONSULT NOTE - Initial Consult  Pharmacy Consult for warfarin Indication: h/o PE  Allergies  Allergen Reactions  . Other Hives    Cheap jewelry.    Patient Measurements:   Heparin Dosing Weight:   Vital Signs: Temp: 98.1 F (36.7 C) (11/18 1117) Temp src: Oral (11/18 1117) BP: 141/72 mmHg (11/18 1730) Pulse Rate: 88  (11/18 1730)  Labs:  Alvira Philips 05/24/12 0822 05/24/12 0815 05/24/12 0813 05/24/12  HGB -- -- 12.6 --  HCT -- -- 38.5 --  PLT -- -- 346 --  APTT -- -- -- --  LABPROT 43.5* -- -- --  INR 5.05* -- Sent out for confirmation 5.05  HEPARINUNFRC -- -- -- --  CREATININE -- 0.7 -- --  CKTOTAL -- -- -- --  CKMB -- -- -- --  TROPONINI -- -- -- --    The CrCl is unknown because both a height and weight (above a minimum accepted value) are required for this calculation.   Medical History: Past Medical History  Diagnosis Date  . Hypertension   . Heart murmur   . Hip fracture, left   . COPD (chronic obstructive pulmonary disease)   . Skin cancer   . Skin cancer   . 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 Scoliosis 11/01/11    l3  . History of chemotherapy     6 cycles carboplatin/paclitaxel,last dose 08/2006,again given 07/22/2010,alimta q 3weeks s/p 9 cycless  . History of radiation therapy 08/10/2007-09/07/2007    right paratracheal area/ Dr.Kinard, [place]  . History of radiation therapy 2007    bilateral sterotatic radiotherapy Dr[facility]McMullen at [facility] completed 11/25/2005  . Anemia   . S/P radiation therapy 12/04/11 - 01/19/12    [facility] : Right Femur/3000 cGy/10 Fractions and Right Iliac Bone / 3500 cGy/14 Fractions  . Bilateral pulmonary embolism   . CVA (cerebral infarction) Documented 02/18/12    Lovenox  . SOB (shortness of breath) on exertion Documented 02/18/12  . Pain   . Lung cancer 09/18/2005    rul =nscca dx  . Cancer 11/01/11     mets rightfemur//iliac 5.6cm,&left iliac  . History of radiation therapy 04/2012      bone    Medications:  Scheduled:    . sodium chloride   Intravenous STAT  . [COMPLETED] albuterol  5 mg Nebulization Once  . [COMPLETED] ipratropium  0.5 mg Nebulization Once  . [COMPLETED]  morphine injection  4 mg Intravenous Once  . [COMPLETED] ondansetron  4 mg Intravenous Once  . oxyCODONE  15 mg Oral Q8H  . potassium chloride  10 mEq Intravenous Q1 Hr x 3  . potassium chloride SA  20 mEq Oral BID  . [COMPLETED] sodium chloride  1,000 mL Intravenous Once  . sodium chloride  3 mL Intravenous Q12H  . tiotropium  18 mcg Inhalation Daily  . [DISCONTINUED] phytonadione (VITAMIN K) IV  5 mg Intravenous Once  . [DISCONTINUED] potassium chloride  10 mEq Intravenous Q1 Hr x 2    Assessment: 31 YOF with h/o NSCLC with bone mets.  She came to cancer center for chemotherapy today but was found to be hypotensive and INR = 5.05. No bleeding evident  Prior to admission dose = 2.5mg  daily  Orders for vit K 5mg  IV were cancelled prior to administration Goal of Therapy:  INR 2-3 Monitor platelets by anticoagulation protocol: Yes   Plan:  Hold warfarin for supratherapeutic INR Daily INR  Suzette Battiest, Tad Moore  05/24/2012,5:55 PM

## 2012-05-24 NOTE — ED Notes (Addendum)
Pt placed on 10L NRB because of lowering O2 sats. Mouth breather.

## 2012-05-24 NOTE — ED Notes (Signed)
Pt originally requested in and out cath, now requesting to use bed side commode to obtain urine specimen.

## 2012-05-24 NOTE — Consult Note (Signed)
Name: [name] MRN: [id number] DOB: [birth year]    LOS: 0  Referring Provider: [facility]  Reason for Referral:  Shock ? 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 p getting premed > hypotensive to ER where received fluid bolus and became more alert denying cp, nausea, change in chronic dry cough, nausea or abd pain/ dysuria. PCCM asked to eval for ? Cause of shock  Past Medical History  Diagnosis Date  . Hypertension   . Heart murmur   . Hip fracture, left   . COPD (chronic obstructive pulmonary disease)   . Skin cancer   . Skin cancer   . 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 Scoliosis 11/01/11    l3  . History of chemotherapy     6 cycles carboplatin/paclitaxel,last dose 08/2006,again given 07/22/2010,alimta q 3weeks s/p 9 cycless  . History of radiation therapy 08/10/2007-09/07/2007    right paratracheal area/ Dr.Kinard, [place]  . History of radiation therapy 2007    bilateral sterotatic radiotherapy Dr[facility]McMullen at [facility] completed 11/25/2005  . Anemia   . S/P radiation therapy 12/04/11 - 01/19/12    [facility] : Right Femur/3000 cGy/10 Fractions and Right Iliac Bone / 3500 cGy/14 Fractions  . Bilateral pulmonary embolism   . CVA (cerebral infarction) Documented 02/18/12    Lovenox  . SOB (shortness of breath) on exertion Documented 02/18/12  . Pain   . Lung cancer 09/18/2005    rul =nscca dx  . Cancer 11/01/11     mets rightfemur//iliac 5.6cm,&left iliac  . History of radiation therapy 04/2012    bone   Past Surgical History  Procedure Date  . Portacath placement   . Hip surgery     LEFT HIP  . Femur im nail 11/13/2011    Procedure: INTRAMEDULLARY (IM) NAIL FEMORAL;  Surgeon: Senaida Lange, MD;  Location: [facility] OR;  Service: Orthopedics;   Laterality: Right;  IM NAILING OF RIGHT HIP   Prior to Admission medications   Medication Sig Start Date End Date Taking? Authorizing Provider  bisoprolol (ZEBETA) 5 MG tablet Take 2.5 mg by mouth daily.    Yes Historical Provider, MD  furosemide (LASIX) 20 MG tablet Take 1 tablet (20 mg total) by mouth 2 (two) times daily. For edema 12/13/11  Yes [name], MD  lisinopril (PRINIVIL,ZESTRIL) 20 MG tablet Take 20 mg by mouth daily.     Yes Lonie Peak, PA  naproxen sodium (ANAPROX) 220 MG tablet Take 220 mg by mouth 2 (two) times daily as needed. For pain   Yes Historical Provider, MD  oxyCODONE (OXYCONTIN) 15 MG TB12 Take 15 mg by mouth every 8 (eight) hours. 12/13/11  Yes [name], MD  oxyCODONE-acetaminophen (PERCOCET) 5-325 MG per tablet Take 1-2 tablets by mouth every 4 (four) hours as needed. For pain 11/13/11  Yes Senaida Lange, MD  potassium chloride SA (K-DUR,KLOR-CON) 20 MEQ tablet Take 20 mEq by mouth 2 (two) times daily. 05/24/12  Yes Si Gaul, MD  prochlorperazine (COMPAZINE) 10 MG tablet Take 1 tablet (10 mg total) by mouth every 6 (six) hours as needed. For nausea 03/29/12  Yes Conni Slipper, PA  tiotropium (SPIRIVA) 18 MCG inhalation capsule Place 18 mcg  into inhaler and inhale daily.   Yes Historical Provider, MD  warfarin (COUMADIN) 5 MG tablet Take 2.5 mg by mouth daily.  02/18/12  Yes Conni Slipper, PA  polyethylene glycol (GOLYTELY) 236 G solution Take 240 mLs by mouth once as needed. Constipation, can repeat in 1 hour if no response 05/05/12   Celene Kras, MD   Allergies Allergies  Allergen Reactions  . Other Hives    Cheap jewelry.    Family History Family History  Problem Relation Age of Onset  . Breast cancer Paternal Grandmother   . Prostate cancer Maternal Uncle     brainand colon ca  . Heart attack Mother 50   Social History  reports that she has been smoking Cigarettes.  She has a 25 pack-year smoking history. She has never used smokeless  tobacco. She reports that she does not drink alcohol or use illicit drugs.  Review Of Systems:   Gen weak x sev days with difficulty walking on this basis ROS  The following are not active complaints unless bolded sore throat, dysphagia, dental problems, itching, sneezing,  nasal congestion or excess/ purulent secretions, ear ache,   fever, chills, sweats, unintended wt loss, pleuritic or exertional cp, hemoptysis,  orthopnea pnd or leg swelling, presyncope, palpitations, heartburn, abdominal pain, anorexia, nausea, vomiting, diarrhea  or change in bowel or urinary habits, change in stools or urine, dysuria,hematuria,  rash, arthralgias, visual complaints, headache, numbness weakness or ataxia or problems with walking or coordination,  change in mood/affect or memory.        Current Status: In ER awaiting Admit  Vital Signs: Temp:  [98.1 F (36.7 C)-98.2 F (36.8 C)] 98.1 F (36.7 C) (11/18 1117) Pulse Rate:  [78-116] 78  (11/18 1600) Resp:  [18-24] 18  (11/18 1600) BP: (60-132)/(37-86) 104/63 mmHg (11/18 1600) SpO2:  [76 %-100 %] 88 % (11/18 1600)  Physical Examination: General:ederly wf nad HEENT mild turbinate edema.  Oropharynx no thrush or excess pnd or cobblestoning.  No JVD or cervical adenopathy. Mild accessory muscle hypertrophy. Trachea midline, nl thryroid. Chest was hyperinflated by percussion with diminished breath sounds and moderate increased exp time without wheeze. Hoover sign positive at mid inspiration. Regular rate and rhythm without murmur gallop or rub or increase P2 or edema.  Abd: no hsm, nl excursion. Ext warm without cyanosis or clubbing.    Principal Problem:  *Hypotension Active Problems:  Malignant neoplasm of bronchus and lung, unspecified site  Skin cancer  COPD (chronic obstructive pulmonary disease)  Secondary malignant neoplasm of bone and bone marrow  Pathological fracture of hip in neoplastic disease  Pulmonary embolism  TIA (transient ischemic  attack)  CVA (cerebral infarction)   ASSESSMENT AND PLAN  PULMONARY No results found for this basename: PHART:5,PCO2:5,PCO2ART:5,PO2ART:5,HCO3:5,O2SAT:5 in the last 168 hours     CXR/CT chest 11/18 Persistent perihilar mass lesions.  2. Worsening pulmonary metastasis.  3. Right lower lobe airspace consolidation / pneumonia with  possible endobronchial tumor.  4. Stable advanced atherosclerotic changes.  5. T12 compression fracture, likely pathologic     A:  02 dep resp failure/ acute ? Etiology in pt with lung ca and ? Copd/ ? Tumor obst RLL P:   Supplemental 02  CARDIOVASCULAR  Lab 05/24/12 1220  TROPONINI --  LATICACIDVEN 1.2  PROBNP 1946.0*      A: shock ? Etiology, relatively vol depleted on multiple antibp meds, poor po intake/ ? Adverse rxn to premeds for chemo P:  Fluids/ hold all  bp meds   RENAL  Lab 05/24/12 1220 05/24/12 0815 05/18/12 1528  NA -- 139 138  K 2.9* 2.2 Repeated and Verified* --  CL -- 87* 89*  CO2 -- 41* 42*  BUN -- 12.0 7.0  CREATININE -- 0.7 0.6  CALCIUM -- 10.0 10.3  MG -- -- --  PHOS -- -- --   Intake/Output    None    Foley:  In ER  A:  Hypokalemia and metabolic alkalosis P:   Vol exp with NS/ supplement KCl  GASTROINTESTINAL  Lab 05/24/12 0815 05/18/12 1528  AST 20 20  ALT 14 11  ALKPHOS 165* 196*  BILITOT 0.99 1.32*  PROT 6.5 7.0  ALBUMIN 2.9* 3.0*    A:  Elevated alk phos likely from bone mets  HEMATOLOGIC  Lab 05/24/12 0822 05/24/12 0813 05/24/12 05/18/12 1528  HGB -- 12.6 -- 12.8  HCT -- 38.5 -- 37.1  PLT -- 346 -- 276  INR 5.05* Sent out for confirmation 5.05 --  APTT -- -- -- --   A: Therapeutic anticoagulation doubt PE unless this is trousseau's  INFECTIOUS  Lab 05/24/12 1850 05/24/12 1220 05/24/12 0813 05/18/12 1528  WBC 5.8 -- 9.4 6.2  PROCALCITON -- <0.10 -- --   Micro/Cultures: MRSA 05/24/12 > neg    Antibiotics:    A: No evidence of sepsis or septic source P:  No abx indicated      ENDOCRINE No results found for this basename: GLUCAP:5 in the last 168 hours A:  No issues   NEUROLOGIC  A: ams p chemo pre-RX, better with fluids P  Monitor  BEST PRACTICE / DISPOSITION Level of Care:  SD Primary Service:  [facility] Consultants:  PCCM Code Status:  Full Diet: clear liquids DVT Px:  Coumadin GI Px:  Not needed Skin Integrity:  ok Social / Family:  No fm at bedside  The patient is critically ill with multiple organ systems failure and requires high complexity decision making for assessment and support, frequent evaluation and titration of therapies, application of advanced monitoring technologies and extensive interpretation of multiple databases. Critical Care Time devoted to patient care services described in this note is 45 minutes.   Sandrea Hughs, M.D. Pulmonary and Critical Care Medicine [facility] Pager: [phone]  05/24/2012, 4:45 PM

## 2012-05-24 NOTE — Progress Notes (Signed)
eLink Physician-Brief Progress Note Patient Name: [name] DOB: 1941-01-27 MRN: [id number]  Date of Service  05/24/2012   HPI/Events of Note   Hypokalemia and Hypomagnesemia  eICU Interventions  K in process of being replaced; magnesium replacement ordered      [facility] 05/24/2012, 9:21 PM

## 2012-05-24 NOTE — ED Provider Notes (Addendum)
History     CSN: [id number]  Arrival date & time 05/24/12  1117   First MD Initiated Contact with Patient 05/24/12 1118      Chief Complaint  Patient presents with  . Hypotension    hypokalemia    (Consider location/radiation/quality/duration/timing/severity/associated sxs/prior treatment) HPI Comments: Patient has a history of breast cancer and PE who is currently anticoagulated. She went to the cancer Center this morning for her chemotherapy treatment. After they started the treatment she became hypotensive 60/30. Patient is lightheaded, dizzy and near-syncopal. The infusion was stopped and she was started on 500 cc of normal saline. Because her blood pressure did not change she was sent to the emergency room for further care. She states she's had a cough and congestion but denies productive cough. Also denies fever, abdominal pain, chest pain or vomiting. Patient had labs done at the cancer center which showed an INR of 5, CBC within normal limits and CMP with potassium of 2.2.  On exam here patient is complaining of not feeling well. She has no focal symptoms except for iliac crests pain  The history is provided by the patient.    Past Medical History  Diagnosis Date  . Hypertension   . Heart murmur   . Hip fracture, left   . COPD (chronic obstructive pulmonary disease)   . Skin cancer   . Skin cancer   . 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 Scoliosis 11/01/11    l3  . History of chemotherapy     6 cycles carboplatin/paclitaxel,last dose 08/2006,again given 07/22/2010,alimta q 3weeks s/p 9 cycless  . History of radiation therapy 08/10/2007-09/07/2007    right paratracheal area/ Dr.Kinard, [place]  . History of radiation therapy 2007    bilateral sterotatic radiotherapy Dr.McMullen at [facility] completed 11/25/2005  . Anemia   . S/P radiation therapy 12/04/11 - 01/19/12    [facility] : Right Femur/3000 cGy/10 Fractions and Right Iliac Bone / 3500 cGy/14  Fractions  . Bilateral pulmonary embolism   . CVA (cerebral infarction) Documented 02/18/12    Lovenox  . SOB (shortness of breath) on exertion Documented 02/18/12  . Pain   . Lung cancer 09/18/2005    rul =nscca dx  . Cancer 11/01/11     mets rightfemur//iliac 5.6cm,&left iliac  . History of radiation therapy 04/2012    bone    Past Surgical History  Procedure Date  . Portacath placement   . Hip surgery     LEFT HIP  . Femur im nail 11/13/2011    Procedure: INTRAMEDULLARY (IM) NAIL FEMORAL;  Surgeon: Senaida Lange, MD;  Location: [facility] OR;  Service: Orthopedics;  Laterality: Right;  IM NAILING OF RIGHT HIP    Family History  Problem Relation Age of Onset  . Breast cancer Paternal Grandmother   . Prostate cancer Maternal Uncle     brainand colon ca  . Heart attack Mother 56    History  Substance Use Topics  . Smoking status: Current Every Day Smoker -- 0.5 packs/day for 50 years    Types: Cigarettes  . Smokeless tobacco: Not on file  . Alcohol Use: No    OB History    Grav Para Term Preterm Abortions TAB SAB Ect Mult Living                  Review of Systems  Constitutional: Negative for fever and chills.  Respiratory: Positive for cough. Negative for shortness of  breath and wheezing.   Cardiovascular: Negative for chest pain and leg swelling.  Gastrointestinal: Positive for abdominal pain. Negative for nausea, vomiting and diarrhea.       Vomited twice last week but no continuous vomiting  Genitourinary: Negative for dysuria.  Neurological: Positive for weakness.  All other systems reviewed and are negative.    Allergies  Other  Home Medications   Current Outpatient Rx  Name  Route  Sig  Dispense  Refill  . BISOPROLOL FUMARATE 5 MG PO TABS   Oral   Take 2.5 mg by mouth daily.          . FUROSEMIDE 20 MG PO TABS   Oral   Take 1 tablet (20 mg total) by mouth 2 (two) times daily. For edema   30 tablet   0   . LISINOPRIL 20 MG PO TABS   Oral   Take  20 mg by mouth daily.           . OXYCODONE HCL ER 15 MG PO TB12   Oral   Take 1 tablet (15 mg total) by mouth every 12 (twelve) hours.   30 tablet   0   . OXYCODONE-ACETAMINOPHEN 5-325 MG PO TABS   Oral   Take 1 tablet by mouth every 4 (four) hours as needed. take 1-2 every 4-6hrs         . PEG 3350-KCL-NABCB-NACL-NASULF 236 G PO SOLR   Oral   Take 240 mLs by mouth once as needed. Constipation, can repeat in 1 hour if no response   4000 mL   0   . POTASSIUM CHLORIDE CRYS ER 20 MEQ PO TBCR   Oral   Take 1 tablet (20 mEq total) by mouth daily.   7 tablet   0   . POTASSIUM CHLORIDE CRYS ER 20 MEQ PO TBCR   Oral   Take 1 tablet (20 mEq total) by mouth 2 (two) times daily.   20 tablet   0     Take 1 tablet twice a day for 10 days   . PROCHLORPERAZINE MALEATE 10 MG PO TABS   Oral   Take 1 tablet (10 mg total) by mouth every 6 (six) hours as needed. For nausea   30 tablet   0   . PSYLLIUM 28 % PO PACK   Oral   Take 1 packet by mouth 2 (two) times daily.   30 packet   1   . TIOTROPIUM BROMIDE MONOHYDRATE 18 MCG IN CAPS   Inhalation   Place 18 mcg into inhaler and inhale daily.         . WARFARIN SODIUM 5 MG PO TABS   Oral   Take 2.5 mg by mouth daily.            BP 90/45  Pulse 116  Temp 98.1 F (36.7 C) (Oral)  Resp 20  SpO2 97%  Physical Exam  Nursing note and vitals reviewed. Constitutional: She is oriented to person, place, and time. She appears well-developed and well-nourished. No distress.  HENT:  Head: Normocephalic and atraumatic.  Mouth/Throat: Oropharynx is clear and moist. Mucous membranes are dry.  Eyes: Conjunctivae normal and EOM are normal. Pupils are equal, round, and reactive to light.  Neck: Normal range of motion. Neck supple.  Cardiovascular: Normal rate, regular rhythm and intact distal pulses.   No murmur heard. Pulmonary/Chest: Effort normal. No respiratory distress. She has wheezes. She has rhonchi. She has no rales.  Abdominal: Soft. She exhibits no distension. There is no tenderness. There is no rebound and no guarding.  Musculoskeletal: Normal range of motion. She exhibits no edema and no tenderness.  Neurological: She is alert and oriented to person, place, and time.  Skin: Skin is warm and dry. No rash noted. No erythema.  Psychiatric: She has a normal mood and affect. Her behavior is normal.    ED Course  Procedures (including critical care time)  Labs Reviewed  POTASSIUM - Abnormal; Notable for the following:    Potassium 2.9 (*)     All other components within normal limits  POCT I-STAT TROPONIN I - Abnormal; Notable for the following:    Troponin i, poc 0.13 (*)     All other components within normal limits  LACTIC ACID, PLASMA  URINALYSIS, ROUTINE W REFLEX MICROSCOPIC   Dg Chest 2 View  05/24/2012  *RADIOLOGY REPORT*  Clinical Data: Cough, congestion, lung cancer  CHEST - 2 VIEW  Comparison: 04/09/2012, 05/05/2012  Findings: Right IJ power port catheter tip in the SVC.  Stable heart size and vascularity.  No CHF.  Background emphysema noted. Stable perihilar linear densities as before. Known pulmonary nodules are better seen by CT.  No definite superimposed CHF, effusion, definite pneumonia or pneumothorax.  Atherosclerosis of the aorta.  IMPRESSION: Stable chronic changes as described.  No superimposed acute process.   Original Report Authenticated By: Judie Petit. Shick, M.D.      1. Hypotension   2. Hypokalemia       MDM   Patient sent from the cancer center 22 hypotension. She started the infusion of her chemotherapeutic agent and started feeling weak and dizzy. When they checked her blood pressure was 63/38. The infusion was stopped and she was given a bolus of fluids without improvement of her blood pressure. Patient states she's been feeling ill the last few days. She complains of a cough without productive sputum and back pain. She says the back pain is getting worse since she started  radiation and has known bone metastases. Also patient was found to be hypokalemic with a potassium of 2.2.  CBC and Cr wnl today.  We'll start potassium on the patient. Blood pressure here is 90/45. We'll give albuterol and Atrovent for wheezing. Chest x-ray, UA and lactic acid pending  1:30 PM Patient's labs are unrevealing except for a mild troponin leak of 0.13. Hypokalemia on repeat potassium was 2.9. Will treat with several runs of potassium. After IV bolus blood pressure has improved to 110s/60. Lactate wnl.  UA pending.  1:32 PM Will admit for obs overnight.      Gwyneth Sprout, MD 05/24/12 1332  Gwyneth Sprout, MD 05/24/12 1404  Gwyneth Sprout, MD 05/24/12 1420

## 2012-05-24 NOTE — Patient Instructions (Signed)
[facility] Discharge Instructions for Patients Receiving Chemotherapy  Today you received the following chemotherapy agents Gemzar  To help prevent nausea and vomiting after your treatment, we encourage you to take your nausea medication as prescribed.   If you develop nausea and vomiting that is not controlled by your nausea medication, call the clinic. If it is after clinic hours your family physician or the after hours number for the clinic or go to the Emergency Department.   BELOW ARE SYMPTOMS THAT SHOULD BE REPORTED IMMEDIATELY:  *FEVER GREATER THAN 100.5 F  *CHILLS WITH OR WITHOUT FEVER  NAUSEA AND VOMITING THAT IS NOT CONTROLLED WITH YOUR NAUSEA MEDICATION  *UNUSUAL SHORTNESS OF BREATH  *UNUSUAL BRUISING OR BLEEDING  TENDERNESS IN MOUTH AND THROAT WITH OR WITHOUT PRESENCE OF ULCERS  *URINARY PROBLEMS  *BOWEL PROBLEMS  UNUSUAL RASH Items with * indicate a potential emergency and should be followed up as soon as possible.  One of the nurses will contact you 24 hours after your treatment. Please let the nurse know about any problems that you may have experienced. Feel free to call the clinic you have any questions or concerns. The clinic phone number is [phone].   I have been informed and understand all the instructions given to me. I know to contact the clinic, my physician, or go to the Emergency Department if any problems should occur. I do not have any questions at this time, but understand that I may call the clinic during office hours   should I have any questions or need assistance in obtaining follow up care.   Gemzar (Gemcitabine) What is this medicine? GEMCITABINE (jem SIT a been) is a chemotherapy drug. This medicine is used to treat many types of cancer like breast cancer, lung cancer, pancreatic cancer, and ovarian cancer. This medicine may be used for other purposes; ask your health care provider or pharmacist if you have  questions.  What should I tell my health care provider before I take this medicine? They need to know if you have any of these conditions: -blood disorders -infection -kidney disease -liver disease -recent or ongoing radiation therapy -an unusual or allergic reaction to gemcitabine, other chemotherapy, other medicines, foods, dyes, or preservatives -pregnant or trying to get pregnant -breast-feeding  How should I use this medicine? This drug is given as an infusion into a vein. It is administered in a hospital or clinic by a specially trained health care professional. Talk to your pediatrician regarding the use of this medicine in children. Special care may be needed. Overdosage: If you think you have taken too much of this medicine contact a poison control center or emergency room at once. NOTE: This medicine is only for you. Do not share this medicine with others.  What if I miss a dose? It is important not to miss your dose. Call your doctor or health care professional if you are unable to keep an appointment.  What may interact with this medicine? -medicines to increase blood counts like filgrastim, pegfilgrastim, sargramostim -some other chemotherapy drugs like cisplatin -vaccines Talk to your doctor or health care professional before taking any of these medicines: -acetaminophen -aspirin -ibuprofen -ketoprofen -naproxen This list may not describe all possible interactions. Give your health care provider a list of all the medicines, herbs, non-prescription drugs, or dietary supplements you use. Also tell them if you smoke, drink alcohol, or use illegal drugs. Some items may interact with your medicine. What should I watch for while   using this medicine? Visit your doctor for checks on your progress. This drug may make you feel generally unwell. This is not uncommon, as chemotherapy can affect healthy cells as well as cancer cells. Report any side effects. Continue your course of  treatment even though you feel ill unless your doctor tells you to stop. In some cases, you may be given additional medicines to help with side effects. Follow all directions for their use. Call your doctor or health care professional for advice if you get a fever, chills or sore throat, or other symptoms of a cold or flu. Do not treat yourself. This drug decreases your body's ability to fight infections. Try to avoid being around people who are sick. This medicine may increase your risk to bruise or bleed. Call your doctor or health care professional if you notice any unusual bleeding. Be careful brushing and flossing your teeth or using a toothpick because you may get an infection or bleed more easily. If you have any dental work done, tell your dentist you are receiving this medicine. Avoid taking products that contain aspirin, acetaminophen, ibuprofen, naproxen, or ketoprofen unless instructed by your doctor. These medicines may hide a fever. Women should inform their doctor if they wish to become pregnant or think they might be pregnant. There is a potential for serious side effects to an unborn child. Talk to your health care professional or pharmacist for more information. Do not breast-feed an infant while taking this medicine.  What side effects may I notice from receiving this medicine? Side effects that you should report to your doctor or health care professional as soon as possible: -allergic reactions like skin rash, itching or hives, swelling of the face, lips, or tongue -low blood counts - this medicine may decrease the number of white blood cells, red blood cells and platelets. You may be at increased risk for infections and bleeding. -signs of infection - fever or chills, cough, sore throat, pain or difficulty passing urine -signs of decreased platelets or bleeding - bruising, pinpoint red spots on the skin, black, tarry stools, blood in the urine -signs of decreased red blood cells -  unusually weak or tired, fainting spells, lightheadedness -breathing problems -chest pain -mouth sores -nausea and vomiting -pain, swelling, redness at site where injected -pain, tingling, numbness in the hands or feet -stomach pain -swelling of ankles, feet, hands -unusual bleeding  Side effects that usually do not require medical attention (report to your doctor or health care professional if they continue or are bothersome): -constipation -diarrhea -hair loss -loss of appetite -stomach upset This list may not describe all possible side effects. Call your doctor for medical advice about side effects. You may report side effects to FDA at 1-800-FDA-1088. Where should I keep my medicine? This drug is given in a hospital or clinic and will not be stored at home. NOTE: This sheet is a summary. It may not cover all possible information. If you have questions about this medicine, talk to your doctor, pharmacist, or health care provider.  2012, [employer]. (11/02/2007 6:45:54 PM)   

## 2012-05-25 ENCOUNTER — Telehealth: Payer: Self-pay | Admitting: Pharmacist

## 2012-05-25 ENCOUNTER — Other Ambulatory Visit: Payer: No Typology Code available for payment source | Admitting: Lab

## 2012-05-25 ENCOUNTER — Ambulatory Visit: Payer: No Typology Code available for payment source

## 2012-05-25 DIAGNOSIS — D62 Acute posthemorrhagic anemia: Secondary | ICD-10-CM

## 2012-05-25 DIAGNOSIS — I959 Hypotension, unspecified: Secondary | ICD-10-CM

## 2012-05-25 LAB — BASIC METABOLIC PANEL
BUN: 15 mg/dL (ref 6–23)
Creatinine, Ser: 0.52 mg/dL (ref 0.50–1.10)
GFR calc Af Amer: >90 mL/min (ref >90)
GFR calc non Af Amer: >90 mL/min (ref >90)
Potassium: 4 mEq/L (ref 3.5–5.1)

## 2012-05-25 LAB — PROTIME-INR
INR: 6.02 (ref 0.00–1.49)
Prothrombin Time: 49.5 seconds — ABNORMAL HIGH (ref 11.6–15.2)

## 2012-05-25 LAB — CBC
HCT: 30.2 % — ABNORMAL LOW (ref 36.0–46.0)
Hemoglobin: 9.8 g/dL — ABNORMAL LOW (ref 12.0–15.0)
RBC: 3.2 MIL/uL — ABNORMAL LOW (ref 3.87–5.11)
WBC: 6.7 10*3/uL (ref 4.0–10.5)

## 2012-05-25 LAB — COMPREHENSIVE METABOLIC PANEL
BUN: 11 mg/dL (ref 6–23)
Calcium: 9.1 mg/dL (ref 8.4–10.5)
GFR calc Af Amer: >90 mL/min (ref >90)
Glucose, Bld: 105 mg/dL — ABNORMAL HIGH (ref 70–99)
Sodium: 141 mEq/L (ref 135–145)
Total Protein: 5.4 g/dL — ABNORMAL LOW (ref 6.0–8.3)

## 2012-05-25 LAB — GLUCOSE, CAPILLARY: Glucose-Capillary: 127 mg/dL — ABNORMAL HIGH (ref 70–99)

## 2012-05-25 LAB — CORTISOL: Cortisol, Plasma: 26.8 ug/dL

## 2012-05-25 MED ORDER — DEXAMETHASONE 4 MG PO TABS
4.0000 mg | ORAL_TABLET | Freq: Three times a day (TID) | ORAL | Status: DC
  Administered 2012-05-25 – 2012-05-28 (×9): 4 mg via ORAL
  Filled 2012-05-25 (×12): qty 1

## 2012-05-25 MED ORDER — FUROSEMIDE 10 MG/ML IJ SOLN
40.0000 mg | Freq: Once | INTRAMUSCULAR | Status: AC
  Administered 2012-05-25: 40 mg via INTRAVENOUS

## 2012-05-25 MED ORDER — POTASSIUM CHLORIDE CRYS ER 20 MEQ PO TBCR
40.0000 meq | EXTENDED_RELEASE_TABLET | ORAL | Status: AC
  Administered 2012-05-25 (×2): 40 meq via ORAL
  Filled 2012-05-25: qty 2
  Filled 2012-05-25: qty 3

## 2012-05-25 MED ORDER — VITAMIN K1 10 MG/ML IJ SOLN
5.0000 mg | Freq: Once | INTRAVENOUS | Status: AC
  Administered 2012-05-25: 5 mg via INTRAVENOUS
  Filled 2012-05-25: qty 0.5

## 2012-05-25 MED ORDER — SENNOSIDES-DOCUSATE SODIUM 8.6-50 MG PO TABS
1.0000 | ORAL_TABLET | Freq: Two times a day (BID) | ORAL | Status: DC
  Administered 2012-05-25 – 2012-05-27 (×5): 1 via ORAL
  Filled 2012-05-25 (×7): qty 1

## 2012-05-25 MED ORDER — LIDOCAINE 5 % EX PTCH
1.0000 | MEDICATED_PATCH | CUTANEOUS | Status: DC
  Administered 2012-05-25 – 2012-05-26 (×2): 1 via TRANSDERMAL
  Filled 2012-05-25 (×3): qty 1

## 2012-05-25 NOTE — Progress Notes (Signed)
CRITICAL VALUE ALERT  Critical value received:  CO2 42  Date of notification:  05/25/12  Time of notification:  0615  Critical value read back:yes  Nurse who received alert:  Reuel Boom RN  MD notified (1st page):  E link  Time of first page:  0620  MD notified (2nd page):  Time of second page:  Responding MD:  Dr Tyson Alias  Time MD responded:  [phone]

## 2012-05-25 NOTE — Progress Notes (Signed)
Name: [name] MRN: [id number] DOB: 1940/09/04    LOS: 1  Referring Provider: [facility]  Reason for Referral:  Shock ? 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 p getting premed > hypotensive to ER where received fluid bolus and became more alert denying cp, nausea, change in chronic dry cough, nausea or abd pain/ dysuria. PCCM asked to eval for ? Cause of shock    Current Status: Stable  Vital Signs: Temp:  [98.1 F (36.7 C)-98.5 F (36.9 C)] 98.5 F (36.9 C) (11/19 0800) Pulse Rate:  [68-116] 87  (11/19 0800) Resp:  [18-26] 22  (11/19 0800) BP: (60-151)/(37-86) 139/79 mmHg (11/19 0800) SpO2:  [76 %-100 %] 100 % (11/19 0800) Weight:  [50.6 kg (111 lb 8.8 oz)-50.8 kg (111 lb 15.9 oz)] 50.8 kg (111 lb 15.9 oz) (11/19 0500)  Physical Examination: General:ederly wf nad HEENT   Oropharynx no thrush or excess pnd or cobblestoning.  No JVD or cervical adenopathy. Mild accessory muscle hypertrophy. Trachea midline, nl thryroid. Chest was hyperinflated by percussion with diminished breath sounds and moderate increased exp time without wheeze. Hoover sign positive at mid inspiration.  Regular rate and rhythm without murmur gallop or rub or increase P2 or edema.   Abd: no hsm, nl excursion.  Ext warm without cyanosis or clubbing.    Principal Problem:  *Hypotension Active Problems:  Malignant neoplasm of bronchus and lung, unspecified site  Skin cancer  COPD (chronic obstructive pulmonary disease)  Secondary malignant neoplasm of bone and bone marrow  Pathological fracture of hip in neoplastic disease  Pulmonary embolism  TIA (transient ischemic attack)  CVA (cerebral infarction)  Acute blood loss anemia   ASSESSMENT AND PLAN  PULMONARY No results found for this  basename: PHART:5,PCO2:5,PCO2ART:5,PO2ART:5,HCO3:5,O2SAT:5 in the last 168 hours     Dg Chest 2 View  05/24/2012  *RADIOLOGY REPORT*  Clinical Data: Cough, congestion, lung cancer  CHEST - 2 VIEW  Comparison: 04/09/2012, 05/05/2012  Findings: Right IJ power port catheter tip in the SVC.  Stable heart size and vascularity.  No CHF.  Background emphysema noted. Stable perihilar linear densities as before. Known pulmonary nodules are better seen by CT.  No definite superimposed CHF, effusion, definite pneumonia or pneumothorax.  Atherosclerosis of the aorta.  IMPRESSION: Stable chronic changes as described.  No superimposed acute process.   Original Report Authenticated By: Judie Petit. Miles Costain, M.D.    Ct Chest Wo Contrast  05/24/2012  *RADIOLOGY REPORT*  Clinical Data: Shortness of breath.  History of lung cancer.  CT CHEST WITHOUT CONTRAST  Technique:  Multidetector CT imaging of the chest was performed following the standard protocol without IV contrast.  Comparison: 04/09/2012.  Findings: The chest wall is stable.  The right-sided Port-A-Cath is noted.  No supraclavicular or axillary mass or adenopathy.  Small scattered nodes are stable.  No definite breast masses.  Examination of the bony structures demonstrates a significant osteoporosis.  There is a new compression fracture of the T12 vertebral body.  Mild retropulsion of the superior posterior aspect but no significant canal compromise.  No sternal fracture or obvious sternal metastasis.  The heart is mildly enlarged but stable.  No pericardial effusion. There are stable mediastinal and hilar lymph nodes and stable advanced  atherosclerotic changes involving the aorta and branch vessels.  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  Examination of the lung parenchyma demonstrates stable bilateral hilar masses.  Severe underlying emphysematous changes with numerous pulmonary metastasis.  The  pulmonary lesions have enlarged since prior CT scan.  A right middle lobe lesion on image number 30 measures 9 mm and previously measured 7.5 mm.  A right lower lobe lesion adjacent to the spine on image number 34 measures 9.5 mm and previously measured 8 mm.  A left lower lobe nodule on image number 30 measures 6 mm and previously measured 5 mm.  There is a area of airspace consolidation in the right lower lobe, likely pneumonia with prominent air bronchograms.  Cannot exclude endobronchial tumor.  No pleural effusion.  The upper abdomen is grossly stable.  IMPRESSION:  1.  Persistent perihilar mass lesions. 2.  Worsening pulmonary metastasis. 3.  Right lower lobe airspace consolidation / pneumonia with possible endobronchial tumor. 4.  Stable advanced atherosclerotic changes. 5.  T12 compression fracture, likely pathologic.   Original Report Authenticated By: Rudie Meyer, M.D.         A:  02 dep resp failure/ acute ? Etiology in pt with lung ca and ? Copd/ ? Tumor obst RLL P:   Supplemental 02  CARDIOVASCULAR  Lab 05/24/12 1220  TROPONINI --  LATICACIDVEN 1.2  PROBNP 1946.0*      A: shock ? Etiology, relatively vol depleted on multiple antibp meds, poor po intake/ ? Adverse rxn to premeds for chemo(resolved 11-19)  Also d/t severe anemia prob acute blood loss P:  Fluids/ hold all bp meds   RENAL  Lab 05/25/12 0530 05/24/12 2026 05/24/12 0815 05/18/12 1528  NA 141 137 139 138  K 2.9* 3.0* -- --  CL 95* 94* 87* 89*  CO2 42* 39* 41* 42*  BUN 11 13 12.0 7.0  CREATININE 0.47* 0.52 0.7 0.6  CALCIUM 9.1 8.7 10.0 10.3  MG -- 1.6 -- --  PHOS -- 3.7 -- --   Intake/Output      11/18 0701 - 11/19 0700 11/19 0701 - 11/20 0700   P.O. 120    I.V. (mL/kg) 228 (4.5)    IV Piggyback 154    Total Intake(mL/kg) 502 (9.9)    Urine (mL/kg/hr) 1600 (1.3)    Total Output 1600    Net -1098         Stool Occurrence      Foley:  In ER  A:  Hypokalemia and metabolic alkalosis  Lab 05/25/12  0530 05/24/12 2026 05/24/12 1220  K 2.9* 3.0* 2.9*     P:   Vol exp with NS/ supplement KCl Replete K+ GASTROINTESTINAL  Lab 05/25/12 0530 05/24/12 2026 05/24/12 0815 05/18/12 1528  AST 23 24 20 20   ALT 11 11 14 11   ALKPHOS 132* 135* 165* 196*  BILITOT 0.6 0.4 0.99 1.32*  PROT 5.4* 5.4* 6.5 7.0  ALBUMIN 2.4* 2.3* 2.9* 3.0*    A:  Elevated alk phos likely from bone mets  HEMATOLOGIC  Lab 05/25/12 0530 05/24/12 2055 05/24/12 2053 05/24/12 1850 05/24/12 0822 05/24/12 0813 05/18/12 1528  HGB 9.8* 10.6* -- 7.0* -- 12.6 12.8  HCT 30.2* 30.9* -- 21.8* -- 38.5 37.1  PLT 295 291 -- 187 -- 346 276  INR 6.02* -- 5.58* 5.87* 5.05* Sent out for confirmation --  APTT -- -- -- 66* -- -- --   A:  Therapeutic anticoagulation doubt PE unless this is trousseau's(suprathereuptic)   INFECTIOUS  Lab 05/25/12 0530 05/24/12 2055 05/24/12 1850 05/24/12 1220 05/24/12 0813 05/18/12 1528  WBC 6.7 9.0 5.8 -- 9.4 6.2  PROCALCITON -- -- -- <0.10 -- --   Micro/Cultures: MRSA 05/24/12 > neg    Antibiotics:    A: No evidence of sepsis or septic source P:  No abx indicated    ENDOCRINE  Lab 05/25/12 0820  GLUCAP 127*   A:  No issues   NEUROLOGIC  A: ams p chemo pre-RX, better with fluids. Resolved 11-19 P  Monitor  Global: Long conversation about code status. She wants to be comfortable and does not want intubation or cpr. Brett Canales Minor ACNP 11-19 I concur Shan Levans   BEST PRACTICE / DISPOSITION Level of Care:  SD Primary Service:  [facility] Consultants:  PCCM Code Status:  Full/ dnr 11-19 Diet: clear liquids DVT Px:  Coumadin GI Px:  Not needed Skin Integrity:  ok Social / Family:  No fm at bedside  Pt needs palliative care/hospice referral. SHe and her niece are ok with this referral.  I spoke to Dr Arbutus Ped who agrees.   Consider tfr to floor.  PCCM will SIGN OFF  Caryl Bis  [phone]  Cell  [phone]  If no response or cell goes to voicemail,  call beeper [phone]   l11/19/2013, 10:22 AM

## 2012-05-25 NOTE — Progress Notes (Signed)
ANTICOAGULATION CONSULT NOTE - Follow Up  Pharmacy Consult for warfarin Indication: h/o PE  Allergies  Allergen Reactions  . Other Hives    Cheap jewelry.    Patient Measurements: Height: 5\' 4"  (162.6 cm) Weight: 111 lb 15.9 oz (50.8 kg) IBW/kg (Calculated) : 54.7  Heparin Dosing Weight:   Vital Signs: Temp: 98.3 F (36.8 C) (11/19 0000) Temp src: Oral (11/19 0000) BP: 151/69 mmHg (11/19 0600) Pulse Rate: 89  (11/19 0600)  Labs:  Basename 05/25/12 0530 05/24/12 2055 05/24/12 2053 05/24/12 2026 05/24/12 1850 05/24/12 0815  HGB 9.8* 10.6* -- -- -- --  HCT 30.2* 30.9* -- -- 21.8* --  PLT 295 291 -- -- 187 --  APTT -- -- -- -- 66* --  LABPROT 49.5* -- 46.8* -- 48.6* --  INR 6.02* -- 5.58* -- 5.87* --  HEPARINUNFRC -- -- -- -- -- --  CREATININE 0.47* -- -- 0.52 -- 0.7  CKTOTAL -- -- -- -- -- --  CKMB -- -- -- -- -- --  TROPONINI -- -- -- -- -- --    Estimated Creatinine Clearance: 51.7 ml/min (by C-G formula based on Cr of 0.47).   Medical History: Past Medical History  Diagnosis Date  . Hypertension   . Heart murmur   . Hip fracture, left   . COPD (chronic obstructive pulmonary disease)   . Skin cancer   . Skin cancer   . 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 Scoliosis 11/01/11    l3  . History of chemotherapy     6 cycles carboplatin/paclitaxel,last dose 08/2006,again given 07/22/2010,alimta q 3weeks s/p 9 cycless  . History of radiation therapy 08/10/2007-09/07/2007    right paratracheal area/ Dr.Kinard, [place]  . History of radiation therapy 2007    bilateral sterotatic radiotherapy Dr[facility]McMullen at [facility] completed 11/25/2005  . Anemia   . S/P radiation therapy 12/04/11 - 01/19/12    [facility] : Right Femur/3000 cGy/10 Fractions and Right Iliac Bone / 3500 cGy/14 Fractions  . Bilateral pulmonary embolism   . CVA (cerebral infarction) Documented 02/18/12    Lovenox  . SOB (shortness of breath) on exertion Documented 02/18/12  . Pain   .  Lung cancer 09/18/2005    rul =[facility] dx  . Cancer 11/01/11     mets rightfemur//iliac 5.6cm,&left iliac  . History of radiation therapy 04/2012    bone    Medications:  Scheduled:     . sodium chloride   Intravenous STAT  . [COMPLETED] albuterol  5 mg Nebulization Once  . [COMPLETED] furosemide  40 mg Intravenous Once  . [COMPLETED] ipratropium  0.5 mg Nebulization Once  . [COMPLETED] magnesium sulfate 1 - 4 g bolus IVPB  2 g Intravenous Once  . [COMPLETED]  morphine injection  4 mg Intravenous Once  . [COMPLETED] ondansetron  4 mg Intravenous Once  . OxyCODONE  15 mg Oral Q8H  . [EXPIRED] potassium chloride  10 mEq Intravenous Q1 Hr x 3  . potassium chloride SA  20 mEq Oral BID  . potassium chloride  40 mEq Oral Q4H  . [COMPLETED] sodium chloride  1,000 mL Intravenous Once  . sodium chloride  3 mL Intravenous Q12H  . Warfarin - Pharmacist Dosing Inpatient   Does not apply q1800  . [DISCONTINUED] oxyCODONE  15 mg Oral Q8H  . [DISCONTINUED] phytonadione (VITAMIN K) IV  5 mg Intravenous Once  . [DISCONTINUED] potassium chloride  10 mEq Intravenous Q1 Hr x 2  . [  DISCONTINUED] tiotropium  18 mcg Inhalation Daily    Assessment: 45 YOF with h/o NSCLC with bone mets.  She came to cancer center for chemotherapy today but was found to be hypotensive and INR = 5.05. No bleeding evident  Prior to admission dose = 2.5mg  daily  INR continues to be supratherapeutic and not dropping yet  Hgb down some, plts stable  Goal of Therapy:  INR 2-3 Monitor platelets by anticoagulation protocol: Yes   Plan:  Continue to hold warfarin for supratherapeutic INR Daily INR  Hessie Knows, PharmD, BCPS Pager [phone] 05/25/2012 8:24 AM

## 2012-05-25 NOTE — Progress Notes (Signed)
INITIAL ADULT NUTRITION ASSESSMENT Date: 05/25/2012   Time: 12:01 PM Reason for Assessment: MST  INTERVENTION: 1.  Meals/snacks;  RD requested chocolate milk with trays.  Encouraged intake and discussed ways to achieve.   DOCUMENTATION CODES Per approved criteria  -Severe malnutrition in the context of chronic illness    ASSESSMENT: Female 71 y.o.  Dx: Hypotension  Hx:  Past Medical History  Diagnosis Date  . Hypertension   . Heart murmur   . Hip fracture, left   . COPD (chronic obstructive pulmonary disease)   . Skin cancer   . Skin cancer   . 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 Scoliosis 11/01/11    l3  . History of chemotherapy     6 cycles carboplatin/paclitaxel,last dose 08/2006,again given 07/22/2010,alimta q 3weeks s/p 9 cycless  . History of radiation therapy 08/10/2007-09/07/2007    right paratracheal area/ Dr.Kinard, [place]  . History of radiation therapy 2007    bilateral sterotatic radiotherapy Dr.McMullen at [facility], [place] completed 11/25/2005  . Anemia   . S/P radiation therapy 12/04/11 - 01/19/12    [facility] : Right Femur/3000 cGy/10 Fractions and Right Iliac Bone / 3500 cGy/14 Fractions  . Bilateral pulmonary embolism   . CVA (cerebral infarction) Documented 02/18/12    Lovenox  . SOB (shortness of breath) on exertion Documented 02/18/12  . Pain   . Lung cancer 09/18/2005    rul =[facility] dx  . Cancer 11/01/11     mets rightfemur//iliac 5.6cm,&left iliac  . History of radiation therapy 04/2012    bone   Past Surgical History  Procedure Date  . Portacath placement   . Hip surgery     LEFT HIP  . Femur im nail 11/13/2011    Procedure: INTRAMEDULLARY (IM) NAIL FEMORAL;  Surgeon: Senaida Lange, MD;  Location: [facility] OR;  Service: Orthopedics;  Laterality: Right;  IM NAILING OF RIGHT HIP    Related Meds:  Scheduled Meds:   . sodium chloride   Intravenous STAT  . [COMPLETED] albuterol  5 mg Nebulization Once  . [COMPLETED] furosemide  40 mg  Intravenous Once  . [COMPLETED] ipratropium  0.5 mg Nebulization Once  . [COMPLETED] magnesium sulfate 1 - 4 g bolus IVPB  2 g Intravenous Once  . OxyCODONE  15 mg Oral Q8H  . phytonadione (VITAMIN K) IV  5 mg Intravenous Once  . [EXPIRED] potassium chloride  10 mEq Intravenous Q1 Hr x 3  . potassium chloride SA  20 mEq Oral BID  . potassium chloride  40 mEq Oral Q4H  . [COMPLETED] sodium chloride  1,000 mL Intravenous Once  . sodium chloride  3 mL Intravenous Q12H  . Warfarin - Pharmacist Dosing Inpatient   Does not apply q1800  . [DISCONTINUED] oxyCODONE  15 mg Oral Q8H  . [DISCONTINUED] phytonadione (VITAMIN K) IV  5 mg Intravenous Once  . [DISCONTINUED] potassium chloride  10 mEq Intravenous Q1 Hr x 2  . [DISCONTINUED] tiotropium  18 mcg Inhalation Daily   Continuous Infusions:   . [DISCONTINUED] sodium chloride 100 mL/hr at 05/24/12 1831  . [DISCONTINUED] sodium chloride 125 mL/hr at 05/24/12 1735   PRN Meds:.acetaminophen, acetaminophen, albuterol, ipratropium, morphine injection, naproxen, ondansetron (ZOFRAN) IV, ondansetron, oxyCODONE-acetaminophen, polyethylene glycol, prochlorperazine, [DISCONTINUED] albuterol, [DISCONTINUED] ipratropium, [DISCONTINUED] naproxen sodium, [DISCONTINUED] polyethylene glycol   Ht: 5\' 4"  (162.6 cm)  Wt: 111 lb 15.9 oz (50.8 kg)  Ideal Wt: 120 lbs % Ideal Wt: 92%  Usual Wt: 130  lbs 1 year ago % Usual Wt: 85%  Body mass index is 19.22 kg/(m^2).  Food/Nutrition Related Hx: poor appetite and intake PTA, current chemotherapy  Labs:  CMP     Component Value Date/Time   NA 141 05/25/2012 0530   NA 139 05/24/2012 0815   NA 144 08/21/2011 1131   K 2.9* 05/25/2012 0530   K 2.2 Repeated and Verified* 05/24/2012 0815   K 5.1* 08/21/2011 1131   CL 95* 05/25/2012 0530   CL 87* 05/24/2012 0815   CL 94* 08/21/2011 1131   CO2 42* 05/25/2012 0530   CO2 41* 05/24/2012 0815   CO2 33 08/21/2011 1131   GLUCOSE 105* 05/25/2012 0530   GLUCOSE 163*  05/24/2012 0815   GLUCOSE 102 08/21/2011 1131   BUN 11 05/25/2012 0530   BUN 12.0 05/24/2012 0815   BUN 14 08/21/2011 1131   CREATININE 0.47* 05/25/2012 0530   CREATININE 0.7 05/24/2012 0815   CREATININE 0.5* 08/21/2011 1131   CALCIUM 9.1 05/25/2012 0530   CALCIUM 10.0 05/24/2012 0815   CALCIUM 9.7 08/21/2011 1131   PROT 5.4* 05/25/2012 0530   PROT 6.5 05/24/2012 0815   PROT 7.2 08/21/2011 1131   ALBUMIN 2.4* 05/25/2012 0530   ALBUMIN 2.9* 05/24/2012 0815   AST 23 05/25/2012 0530   AST 20 05/24/2012 0815   AST 23 08/21/2011 1131   ALT 11 05/25/2012 0530   ALT 14 05/24/2012 0815   ALKPHOS 132* 05/25/2012 0530   ALKPHOS 165* 05/24/2012 0815   ALKPHOS 108* 08/21/2011 1131   BILITOT 0.6 05/25/2012 0530   BILITOT 0.99 05/24/2012 0815   BILITOT 0.70 08/21/2011 1131   GFRNONAA >90 05/25/2012 0530   GFRAA >90 05/25/2012 0530    CBC    Component Value Date/Time   WBC 6.7 05/25/2012 0530   WBC 9.4 05/24/2012 0813   RBC 3.20* 05/25/2012 0530   RBC 4.10 05/24/2012 0813   HGB 9.8* 05/25/2012 0530   HGB 12.6 05/24/2012 0813   HCT 30.2* 05/25/2012 0530   HCT 38.5 05/24/2012 0813   PLT 295 05/25/2012 0530   PLT 346 05/24/2012 0813   MCV 94.4 05/25/2012 0530   MCV 93.9 05/24/2012 0813   MCH 30.6 05/25/2012 0530   MCH 30.7 05/24/2012 0813   MCHC 32.5 05/25/2012 0530   MCHC 32.7 05/24/2012 0813   RDW 14.1 05/25/2012 0530   RDW 14.3 05/24/2012 0813   LYMPHSABS 0.2* 05/24/2012 2055   LYMPHSABS 0.3* 05/24/2012 0813   MONOABS 0.8 05/24/2012 2055   MONOABS 0.7 05/24/2012 0813   EOSABS 0.0 05/24/2012 2055   EOSABS 0.1 05/24/2012 0813   BASOSABS 0.0 05/24/2012 2055   BASOSABS 0.0 05/24/2012 0813    Intake: 25% per pt Output:   Intake/Output Summary (Last 24 hours) at 05/25/12 1203 Last data filed at 05/25/12 0554  Gross per 24 hour  Intake    502 ml  Output   1600 ml  Net  -1098 ml   Last BM (11/17)  Diet Order: General  Supplements/Tube Feeding: none at this time  IVF:      [DISCONTINUED] sodium chloride Last Rate: 100 mL/hr at 05/24/12 1831  [DISCONTINUED] sodium chloride Last Rate: 125 mL/hr at 05/24/12 1735    Estimated Nutritional Needs:   Kcal: 1620-1770 Protein: 75-90g Fluid: >1.5 L/day  Pt admitted with hypotension during chemotherapy yesterday.  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   Pt reports nausea and poor intake PTA.  She states her nausea  is alleviated by anti-emetics, however her appetite remains poor. Her niece cooks meals for her.  Pt states she was unable to drink Carnation instant breakfast.  She endorses eating and drinking, however per food recall tends to choose lower kcal, lower protein options (i.e. Water, coffee with non-dairy creamer, etc..)  Discussed ways to increase intake and increase caloric density of foods.  Pt agreeable to chocolate milk with tray to supplement intake.  She would prefer this to a supplement.  Note consideration of Palliative consult.  Pt qualifies for severe malnutrition of chronic illness due to 14% wt loss in the past year (11% of wt loss over the past 3 months) with pt meeting <75% of estimated needs per recall.  NUTRITION DIAGNOSIS: -Inadequate oral intake (NI-2.1).  Status: Ongoing  RELATED TO: poor appetite  AS EVIDENCE BY: wt loss  MONITORING/EVALUATION(Goals): 1.  Food/Beverage; improvement in intake to 50% of meals with chocolate milk BID. 2.  GOC; monitor for appropriate nutrition-related interventions.  EDUCATION NEEDS: -Education needs addressed    [name] 05/25/2012, 12:01 PM

## 2012-05-25 NOTE — Progress Notes (Signed)
  Echocardiogram 2D Echocardiogram has been performed.  [name] 05/25/2012, 8:38 AM

## 2012-05-25 NOTE — Progress Notes (Signed)
Patient AV:[id number] [name]      DOB: 12-Oct-1940      YNW:[id number]  71 yo WF with Stg IV NSCLC (DX: 2007)-mets to bone,s/p several lines of systemic therapy and radiation-admitted 11/18 from cancer center due to pre-syncopal episode and hypotension.   Palliative Care Medicine Team goals of care meeting scheduled today (11/19) with patient and niece-[name] [phone]). Niece given [phone] number if unable to make meeting.   Freddie Breech, CNS-C Palliative Medicine Team [facility] Team Phone: [phone] Pager: [phone]

## 2012-05-25 NOTE — Significant Event (Signed)
CRITICAL VALUE ALERT  Critical value received:  CO2 41  Date of notification:  05-25-2012  Time of notification:  1742  Critical value read back:yes  Nurse who received alert:  Boyce Medici  MD notified (1st page):  Dr. Elisabeth Pigeon  Time of first page:  1743  MD notified (2nd page):  Time of second page:  Responding MD:  Dr. Elisabeth Pigeon  Time MD responded:  1744  Dr. Elisabeth Pigeon said that the Critical level CO2 was ok, that the patient was receiving Lasix Edison Nasuti

## 2012-05-25 NOTE — Evaluation (Signed)
Physical Therapy Evaluation Patient Details Name: [name] MRN: [id number] DOB: 10-07-1940 Today's Date: 05/25/2012 Time: 7829-5621 PT Time Calculation (min): 23 min  PT Assessment / Plan / Recommendation Clinical Impression  Pt presents with hypotension with history of stage IV lung cancer with mets to bone, back pain from scoliosis and is on several anti hypertension medications.  Tolerated OOB and ambulation in hallway, however she was initially hesitant to participate with therapy, stating that she "doesn't need therapy."  Pt will benefit from skilled PT in acute venue to address deficits.  PT recommends HHPT for follow up at D/C to maximize pts safety and independence.     PT Assessment  Patient needs continued PT services    Follow Up Recommendations  Home health PT;Supervision/Assistance - 24 hour    Does the patient have the potential to tolerate intense rehabilitation      Barriers to Discharge None      Equipment Recommendations  None recommended by PT    Recommendations for Other Services OT consult   Frequency Min 3X/week    Precautions / Restrictions Precautions Precautions: Fall Precaution Comments: monitor O2 sats Restrictions Weight Bearing Restrictions: No   Pertinent Vitals/Pain 5/10 back pain, suspect it was higher upon returning to chair.       Mobility  Bed Mobility Bed Mobility: Supine to Sit;Sitting - Scoot to Edge of Bed Supine to Sit: 4: Min guard;HOB elevated;With rails Sitting - Scoot to Edge of Bed: 4: Min guard Details for Bed Mobility Assistance: Min/guard to ensure safety of trunk when getting to EOB with cues for hand placement and technique.   Transfers Transfers: Sit to Stand;Stand to Sit Sit to Stand: 1: +2 Total assist;With upper extremity assist;From bed Sit to Stand: Patient Percentage: 60% Stand to Sit: 1: +2 Total assist;With upper extremity assist;With armrests;To chair/3-in-1 Stand to Sit: Patient Percentage:  60% Details for Transfer Assistance: Assist to rise and steady with cues for hand placement, safety and controlled descent when sitting.  Ambulation/Gait Ambulation/Gait Assistance: 1: +2 Total assist Ambulation/Gait: Patient Percentage: 60% Ambulation Distance (Feet): 55 Feet Assistive device: Rolling walker Ambulation/Gait Assistance Details: Requires cues and assist for negotiating RW due to tendency to let RW get too far ahead of her.  Also provided cues for pursed lip breathing as O2 sats dropped to 86% on 3L O2 during ambulation with some difficulty increasing, therefore increased to 4L with O2 sats increasing to 88-90%.   Gait Pattern: Step-through pattern;Decreased stride length;Trunk flexed Gait velocity: decreased Stairs: No Wheelchair Mobility Wheelchair Mobility: No    Shoulder Instructions     Exercises     PT Diagnosis: Difficulty walking;Generalized weakness;Acute pain  PT Problem List: Decreased strength;Decreased activity tolerance;Decreased balance;Decreased mobility;Decreased knowledge of use of DME;Decreased safety awareness;Pain;Cardiopulmonary status limiting activity PT Treatment Interventions: DME instruction;Gait training;Functional mobility training;Therapeutic activities;Balance training;Patient/family education   PT Goals Acute Rehab PT Goals PT Goal Formulation: With patient Time For Goal Achievement: 06/08/12 Potential to Achieve Goals: Good Pt will go Supine/Side to Sit: with supervision PT Goal: Supine/Side to Sit - Progress: Goal set today Pt will go Sit to Supine/Side: with supervision PT Goal: Sit to Supine/Side - Progress: Goal set today Pt will go Sit to Stand: with supervision PT Goal: Sit to Stand - Progress: Goal set today Pt will go Stand to Sit: with supervision PT Goal: Stand to Sit - Progress: Goal set today Pt will Ambulate: 51 - 150 feet;with supervision;with least restrictive assistive device;Other (comment) (w/ O2 sats >  or equal to  90%) PT Goal: Ambulate - Progress: Goal set today  Visit Information  Last PT Received On: 05/25/12 Assistance Needed: +2 (safety, lines)    Subjective Data  Subjective: I don't need physical therapy.  Patient Stated Goal: to return home.    Prior Functioning  Home Living Lives With: Family Available Help at Discharge: Family;Available 24 hours/day Type of Home: House Home Access: Ramped entrance Home Layout: One level Bathroom Shower/Tub: Walk-in shower;Tub/shower unit (pt sponge bathes) Bathroom Toilet: Standard Bathroom Accessibility: Yes How Accessible: Accessible via walker Home Adaptive Equipment: Bedside commode/3-in-1;Walker - standard Prior Function Level of Independence: Needs assistance Needs Assistance: Meal Prep;Light Housekeeping Meal Prep: Total Light Housekeeping: Total Able to Take Stairs?: No Driving: No Vocation: Retired Musician: No difficulties    Cognition  Overall Cognitive Status: Appears within functional limits for tasks assessed/performed Arousal/Alertness: Awake/alert Orientation Level: Appears intact for tasks assessed Behavior During Session: [facility] for tasks performed    Extremity/Trunk Assessment Right Lower Extremity Assessment RLE ROM/Strength/Tone: Deficits RLE ROM/Strength/Tone Deficits: Generalized weakness, grossly 3/5 per functional assessment.  RLE Sensation: WFL - Light Touch Left Lower Extremity Assessment LLE ROM/Strength/Tone: Deficits LLE ROM/Strength/Tone Deficits: Generalized weakness, grossly 3/5 per functional assessment.  LLE Sensation: WFL - Light Touch Trunk Assessment Trunk Assessment: Kyphotic   Balance Balance Balance Assessed: Yes Static Sitting Balance Static Sitting - Balance Support: Feet supported;Left upper extremity supported Static Sitting - Level of Assistance: 4: Min assist;5: Stand by assistance Static Sitting - Comment/# of Minutes: initially varied from min to stand by assist to  sit on EOB.    End of Session PT - End of Session Equipment Utilized During Treatment: Gait belt;Oxygen Activity Tolerance: Patient limited by fatigue;Patient limited by pain Patient left: in chair;with call bell/phone within reach Nurse Communication: Mobility status  GP Functional Assessment Tool Used: Clinical judgement Functional Limitation: Mobility: Walking and moving around Mobility: Walking and Moving Around Current Status ([id number]): At least 40 percent but less than 60 percent impaired, limited or restricted Mobility: Walking and Moving Around Goal Status [phone]): At least 20 percent but less than 40 percent impaired, limited or restricted   Lessie Dings 05/25/2012, 10:26 AM

## 2012-05-25 NOTE — Progress Notes (Addendum)
Patient [name]      DOB: 1941/06/01      ONG:[id number]     Consult Note from the Palliative Medicine Team at [facility]    Consult Requested by: Dr Delford Field     PCP: [name], MD Reason for Consultation:Goals of care    Phone Number:[phone]  Assessment of patients Current state: Thin, frail elderly, female, sitting up in bedside chair, oxygen on via nasal cannula, respirations unlabored, complaining of lower back pain with movement.   Reviewed chart, spoke with staff caring for patient and proceeded to have family meeting regarding goals of care. The following family members were present in addition to the patient: Junious Dresser (patients niece, cell phone [phone]),and  Orpah Greek (patients nephew). Patient lives with her sister Norris Cross, niece, Junious Dresser and her great-nephew, Tray. No assigned HPOA for patient. Patients overall status has declined since June, she has experienced weight loss and decreased appetite, ability to ambulate has also declined due to hip and lower back pain. She suffered CVA 12/2011, and also was diagnosed with bilateral PE, at which time she was initiated on anticoagulant therapy. Does have R leg weakness, but no other residual deficits. Began experiencing localized lower back pain over past month.   We had candid discussion of patients cancer progression through several lines of treatment, and she stated that she is tired of going back and forth for treatment related visits, and wants to stop receiving therapy for her cancer. Both patient and niece asked about length of lifepspan without treatment. I informed them that it is only an approximate but without treatment with stage 4 disease is around 4-6 months.  Discussed the philosophy of palliative care and hospice support and services provided. Also engaged in decision with family regarding concepts of Medical Orders for Scope of Treatment (MOST) pertaining to cardiac and pulmonary  resuscitation, desire for acute future interventions for acute issues, the use of antibiotic therapy, intravenous hydration and continuing artificial tube feedings. A MOST form was discussed, completed, signed and placed on chart.     Goals of Care/Scope of Treatment: 1.  Code Status:DNR/DNI  -Patient want to continue conservative monitoring and treatment while in hospital, such as blood draws, vital sign monitoring, support respiratory medications and oxygen therapy  -Maintain IVF at [facility] rate, discontinue telemetry  -Would consider kyphoplasty to T-12 area for symptom control of pain, this is currently a great source of discomfort for her, and impacting quality of life  -Agreeable to discontinuation of Coumadin, and would agree to using aspirin as anticoagulant prophylaxis, since Coumadin would require ongoing blood draws and monitoring  -Would not want the use of artificial tube feeding for nutritional supplementation  -Overall goal is to become stabilized with pain symptoms with this admission with medication adjustment and kyphoplasty if she is a candidate. Wants to discharge home with hospice support, no desire for re-hospitalizations unless symptoms can not be managed at home  Contacted Dr Elisabeth Pigeon, made aware of patient/family decision, ordered Decadron and also placed IR evaluation for kyphoplasty evaluation  4. Disposition: recommend home with hospice support, Case manager (Cookie Physiological scientist) called, and order placed in EPIC to offer hospice choice.   3. Symptom Management:  1. Pain: describes pain in lower back as localized, sharp/dull aching intermittent, rates 6-7/10 currently, also has intermittent pain in R hip area  Oxycontin SR 15 mgscheduled every 8 hours  Percocet 1-2 tabs every 4 hours as needed  Naprosyn 250 mg twice daily as needed  Decadron  4 mg TID  Lidocaine patch 5% to lower back  Educated patient on requesting as needed medications for pain,  listed the  medications she is getting for pain and the ones that need to be requested. Also reviewed possible side effects related to pain medications  2. Bowel Regimen: Senakot-S twice daily, Dulcolax suppository as needed 3. Fever:Tylenol as needed  4. Psychosocial: Emotional support for patient and family during goals of care decision process  5. 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    ROS:  +pain (lower back and R hip), +decreased appetite, +weight loss, +generalized weakness, +prone to constipation, denies n/v, hemoptysis, anxiety or depression    PMH:  Past Medical History  Diagnosis Date  . Hypertension   . Heart murmur   . Hip fracture, left   . COPD (chronic obstructive pulmonary disease)   . Skin cancer   . Skin cancer   . 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 Scoliosis 11/01/11    l3  . History of chemotherapy     6 cycles carboplatin/paclitaxel,last dose 08/2006,again given 07/22/2010,alimta q 3weeks s/p 9 cycless  . History of radiation therapy 08/10/2007-09/07/2007    right paratracheal area/ Dr.Kinard, [place]  . History of radiation therapy 2007    bilateral sterotatic radiotherapy Dr[facility]McMullen at [facility] completed 11/25/2005  . Anemia   . S/P radiation therapy 12/04/11 - 01/19/12    [facility] : Right Femur/3000 cGy/10 Fractions and Right Iliac Bone / 3500 cGy/14 Fractions  . Bilateral pulmonary embolism   . CVA (cerebral infarction) Documented 02/18/12    Lovenox  . SOB (shortness of breath) on exertion Documented 02/18/12  . Pain   . Lung cancer 09/18/2005    rul =[facility] dx  . Cancer 11/01/11     mets rightfemur//iliac 5.6cm,&left iliac  . History of radiation therapy 04/2012    bone     PSH: Past Surgical History  Procedure Date  . Portacath  placement   . Hip surgery     LEFT HIP  . Femur im nail 11/13/2011    Procedure: INTRAMEDULLARY (IM) NAIL FEMORAL;  Surgeon: Senaida Lange, MD;  Location: [facility] OR;  Service: Orthopedics;  Laterality: Right;  IM NAILING OF RIGHT HIP   I have reviewed the FH and SH and  If appropriate update it with new information. Allergies  Allergen Reactions  . Other Hives    Cheap jewelry.   Scheduled Meds:   . sodium chloride   Intravenous STAT  . [COMPLETED] furosemide  40 mg Intravenous Once  . [COMPLETED] furosemide  40 mg Intravenous Once  . [COMPLETED] magnesium sulfate 1 - 4 g bolus IVPB  2 g Intravenous Once  . OxyCODONE  15 mg Oral Q8H  . [COMPLETED] phytonadione (VITAMIN K) IV  5 mg Intravenous Once  . [EXPIRED] potassium chloride  10 mEq Intravenous Q1 Hr x 3  . potassium chloride SA  20 mEq Oral BID  . [COMPLETED] potassium chloride  40 mEq Oral Q4H  . sodium chloride  3 mL Intravenous Q12H  . Warfarin - Pharmacist Dosing Inpatient   Does not apply q1800  . [DISCONTINUED] oxyCODONE  15 mg Oral Q8H  . [DISCONTINUED] phytonadione (VITAMIN K) IV  5 mg Intravenous Once  . [DISCONTINUED] tiotropium  18 mcg  Inhalation Daily   Continuous Infusions:   . [DISCONTINUED] sodium chloride 100 mL/hr at 05/24/12 1831  . [DISCONTINUED] sodium chloride 125 mL/hr at 05/24/12 1735   PRN Meds:.acetaminophen, acetaminophen, albuterol, ipratropium, morphine injection, naproxen, ondansetron (ZOFRAN) IV, ondansetron, oxyCODONE-acetaminophen, polyethylene glycol, prochlorperazine, [DISCONTINUED] albuterol, [DISCONTINUED] ipratropium, [DISCONTINUED] naproxen sodium, [DISCONTINUED] polyethylene glycol    BP 126/59  Pulse 78  Temp 98.6 F (37 C) (Oral)  Resp 23  Ht 5\' 4"  (1.626 m)  Wt 50.8 kg (111 lb 15.9 oz)  BMI 19.22 kg/m2  SpO2 96%   PPS:40%   Intake/Output Summary (Last 24 hours) at 05/25/12 1628 Last data filed at 05/25/12 0554  Gross per 24 hour  Intake    502 ml  Output   1600 ml  Net   -1098 ml   LBM: 2 days ago                        Physical Exam:  General: Pleasant, alert, verbally responsive, grimaces in pain with movement HEENT:  Anicteric, buccal mucosa moist Chest: diminished at bases, fine rhonchi clear with cough CVS:RRR  Abdomen: soft, slightly distended, BS audible Ext: trace non-pitting pedal edema Neuro:alert and oriented x 3  Labs: CBC    Component Value Date/Time   WBC 6.7 05/25/2012 0530   WBC 9.4 05/24/2012 0813   RBC 3.20* 05/25/2012 0530   RBC 4.10 05/24/2012 0813   HGB 9.8* 05/25/2012 0530   HGB 12.6 05/24/2012 0813   HCT 30.2* 05/25/2012 0530   HCT 38.5 05/24/2012 0813   PLT 295 05/25/2012 0530   PLT 346 05/24/2012 0813   MCV 94.4 05/25/2012 0530   MCV 93.9 05/24/2012 0813   MCH 30.6 05/25/2012 0530   MCH 30.7 05/24/2012 0813   MCHC 32.5 05/25/2012 0530   MCHC 32.7 05/24/2012 0813   RDW 14.1 05/25/2012 0530   RDW 14.3 05/24/2012 0813   LYMPHSABS 0.2* 05/24/2012 2055   LYMPHSABS 0.3* 05/24/2012 0813   MONOABS 0.8 05/24/2012 2055   MONOABS 0.7 05/24/2012 0813   EOSABS 0.0 05/24/2012 2055   EOSABS 0.1 05/24/2012 0813   BASOSABS 0.0 05/24/2012 2055   BASOSABS 0.0 05/24/2012 0813    BMET    Component Value Date/Time   NA 141 05/25/2012 0530   NA 139 05/24/2012 0815   NA 144 08/21/2011 1131   K 2.9* 05/25/2012 0530   K 2.2 Repeated and Verified* 05/24/2012 0815   K 5.1* 08/21/2011 1131   CL 95* 05/25/2012 0530   CL 87* 05/24/2012 0815   CL 94* 08/21/2011 1131   CO2 42* 05/25/2012 0530   CO2 41* 05/24/2012 0815   CO2 33 08/21/2011 1131   GLUCOSE 105* 05/25/2012 0530   GLUCOSE 163* 05/24/2012 0815   GLUCOSE 102 08/21/2011 1131   BUN 11 05/25/2012 0530   BUN 12.0 05/24/2012 0815   BUN 14 08/21/2011 1131   CREATININE 0.47* 05/25/2012 0530   CREATININE 0.7 05/24/2012 0815   CREATININE 0.5* 08/21/2011 1131   CALCIUM 9.1 05/25/2012 0530   CALCIUM 10.0 05/24/2012 0815   CALCIUM 9.7 08/21/2011 1131   GFRNONAA >90 05/25/2012  0530   GFRAA >90 05/25/2012 0530    CMP     Component Value Date/Time   NA 141 05/25/2012 0530   NA 139 05/24/2012 0815   NA 144 08/21/2011 1131   K 2.9* 05/25/2012 0530   K 2.2 Repeated and Verified* 05/24/2012 0815   K 5.1* 08/21/2011 1131   CL  95* 05/25/2012 0530   CL 87* 05/24/2012 0815   CL 94* 08/21/2011 1131   CO2 42* 05/25/2012 0530   CO2 41* 05/24/2012 0815   CO2 33 08/21/2011 1131   GLUCOSE 105* 05/25/2012 0530   GLUCOSE 163* 05/24/2012 0815   GLUCOSE 102 08/21/2011 1131   BUN 11 05/25/2012 0530   BUN 12.0 05/24/2012 0815   BUN 14 08/21/2011 1131   CREATININE 0.47* 05/25/2012 0530   CREATININE 0.7 05/24/2012 0815   CREATININE 0.5* 08/21/2011 1131   CALCIUM 9.1 05/25/2012 0530   CALCIUM 10.0 05/24/2012 0815   CALCIUM 9.7 08/21/2011 1131   PROT 5.4* 05/25/2012 0530   PROT 6.5 05/24/2012 0815   PROT 7.2 08/21/2011 1131   ALBUMIN 2.4* 05/25/2012 0530   ALBUMIN 2.9* 05/24/2012 0815   AST 23 05/25/2012 0530   AST 20 05/24/2012 0815   AST 23 08/21/2011 1131   ALT 11 05/25/2012 0530   ALT 14 05/24/2012 0815   ALKPHOS 132* 05/25/2012 0530   ALKPHOS 165* 05/24/2012 0815   ALKPHOS 108* 08/21/2011 1131   BILITOT 0.6 05/25/2012 0530   BILITOT 0.99 05/24/2012 0815   BILITOT 0.70 08/21/2011 1131   GFRNONAA >90 05/25/2012 0530   GFRAA >90 05/25/2012 0530    Chest Xray Reviewed/Impressions:05/24/12 IMPRESSION:  Stable chronic changes as described. No superimposed acute  process.   CT Chest 05/24/12 IMPRESSION:  1. Persistent perihilar mass lesions.  2. Worsening pulmonary metastasis.  3. Right lower lobe airspace consolidation / pneumonia with  possible endobronchial tumor.  4. Stable advanced atherosclerotic changes.  5. T12 compression fracture, likely pathologic.    Time In Time Out Total Time Spent with Patient Total Overall Time  3:00p 4:30p 90 min 120 min    Greater than 50%  of this time was spent counseling and coordinating care related to the above  assessment and plan.   Freddie Breech, CNS-C Palliative Medicine Team [facility] Team Phone: [phone] Pager: [phone]

## 2012-05-25 NOTE — Progress Notes (Addendum)
[facility] PROGRESS NOTE  JAMYIA LOGGINS WUJ:[id number] DOB: Mar 31, 1941 DOA: 05/24/2012 PCP: [name], MD  Brief narrative: 71 year old female with past medical history of recurrent non-small cell lung cancer, adenocarcinoma (involving the right upper lobe and left upper lobe diagnosed in January 2007), osseous metastases, current therapy with tarceva, status post recent radiation to right proximal leg for osseous metastases, pulmonary embolism (on coumadin) who was transferred from cancer center for hypotension. Initial concern was for possible septic shock so PCCM was consulted. Patient's blood pressure responded nicely to fluid challenges and at this time she remains hemodynamically stable and has not required pressors and all since the admission. Patient is sitting in the bed tolerating by mouth intake very well.  Assessment and Plan:   Principal Problem:  *Hypotension  Initial concern was for septic show Appreciate PCCM recommendations Patient remains hemodynamically stable and no need for pressors There appears to be no source of infection. CT chest without contrast showed possible right lower lobe pneumonia however patient remains afebrile and white blood cell count is within normal limits so we have not started antibiotics and this was PCCM recommendation as well IV fluids are now discontinued as patient tolerates oral intake very well Procalcitonin was within normal limits CT chest without contrast was done on the admission and it shows worsening pulmonary metastasis  Antihypertensives are on hold for now Follow up 2-D echo  Active Problems:  Non small cell lung cancer with bone metastases  Management per oncology Dr. Shirline Frees is aware of patient's admission and has seen the patient this morning Chronic diastolic congestive heart failure  BNP elevated at 1946  Patient was given one dose of Lasix in the emergency room  Since blood pressure is better we will go  ahead and give another dose of Lasix today  Please note that IV fluids were stopped  Followup 2-D echo COPD (chronic obstructive pulmonary disease)  Seems to be stable We will continue nebulizer treatments as needed We will hold off on antibiotics for the reasons mentioned above Pulmonary embolism  INR supratherapeutic at 6 Will give 1 dose of vitamin K today Followup INR in the morning Elevated alkaline phosphatase  This is likely secondary to bone metastasis Hypokalemia  Repleted  Follow up BMP in am Metabolic alkalosis  Secondary to lasix DVT prophylaxis  Patient is on Coumadin for pulmonary embolism but at this time Coumadin on hold due to supratherapeutic INR   Diet: As tolerated Code Status: DO NOT RESUSCITATE Family Communication: Updated 2 nephews at bedside Disposition Plan: Transfer to medical floor; home when stable  Manson Passey, MD  [facility]  Pager [phone]  If 7PM-7AM, please contact night-coverage [email or web address] Password [email or web address] 05/25/2012, 1:11 PM   LOS: 1 day   HPI/Subjective: Feels better this morning.  Objective: Filed Vitals:   05/25/12 0500 05/25/12 0600 05/25/12 0800 05/25/12 1200  BP:  151/69 139/79 130/61  Pulse:  89 87 87  Temp:   98.5 F (36.9 C) 97.8 F (36.6 C)  TempSrc:   Oral Oral  Resp:  23 22 22   Height:      Weight: 50.8 kg (111 lb 15.9 oz)     SpO2:  97% 100% 97%    Intake/Output Summary (Last 24 hours) at 05/25/12 1311 Last data filed at 05/25/12 0554  Gross per 24 hour  Intake    502 ml  Output   1600 ml  Net  -1098 ml    Exam:   General:  Pt is  alert, follows commands appropriately, not in acute distress  Cardiovascular: Regular rate and rhythm, S1/S2 appreciated  Respiratory: Diminished breath sounds throughout, upper and mid lung lobe rhonchi  Abdomen: Soft, non tender, non distended, bowel sounds present, no guarding  Extremities: +1 lower extremity pitting edema, pulses DP and PT palpable  bilaterally  Neuro: Grossly nonfocal  Data Reviewed: Basic Metabolic Panel:  Lab 05/25/12 4010 05/24/12 2026 05/24/12 1220 05/24/12 0815 05/18/12 1528  NA 141 137 -- 139 138  K 2.9* 3.0* 2.9* 2.2  3.1*  CL 95* 94* -- 87* 89*  CO2 42* 39* -- 41* 42*  GLUCOSE 105* 168* -- 163* 117*  BUN 11 13 -- 12.0 7.0  CREATININE 0.47* 0.52 -- 0.7 0.6  CALCIUM 9.1 8.7 -- 10.0 10.3  MG -- 1.6 -- -- --  PHOS -- 3.7 -- -- --   Liver Function Tests:  Lab 05/25/12 0530 05/24/12 2026 05/24/12 0815 05/18/12 1528  AST 23 24 20 20   ALT 11 11 14 11   ALKPHOS 132* 135* 165* 196*  BILITOT 0.6 0.4 0.99 1.32*  PROT 5.4* 5.4* 6.5 7.0  ALBUMIN 2.4* 2.3* 2.9* 3.0*   CBC:  Lab 05/25/12 0530 05/24/12 2055 05/24/12 1850 05/24/12 0813 05/18/12 1528  WBC 6.7 9.0 5.8 9.4 6.2  HGB 9.8* 10.6* 7.0* 12.6 12.8  HCT 30.2* 30.9* 21.8* 38.5 37.1  MCV 94.4 92.5 93.2 93.9 93.5  PLT 295 291 187 346 276   CBG:  Lab 05/25/12 0820  GLUCAP 127*     MRSA by PCR NEGATIVE  NEGATIVE Final      Studies: Dg Chest 2 View 05/24/2012  * IMPRESSION: Stable chronic changes as described.  No superimposed acute process.   Original Report Authenticated By: Judie Petit. Miles Costain, M.D.    Ct Chest Wo Contrast 05/24/2012  * IMPRESSION:  1.  Persistent perihilar mass lesions. 2.  Worsening pulmonary metastasis. 3.  Right lower lobe airspace consolidation / pneumonia with possible endobronchial tumor. 4.  Stable advanced atherosclerotic changes. 5.  T12 compression fracture, likely pathologic.   Original Report Authenticated By: Rudie Meyer, M.D.     Scheduled Meds:  . OxyCODONE  15 mg Oral Q8H  . potassium chloride SA  20 mEq Oral BID  . Warfarin    Does not apply q1800

## 2012-05-25 NOTE — Progress Notes (Signed)
Pt having intermittent episodes of severe pain in her mid  back, rated 10/10. Pt reports pain does not respond to any of the pain med administered. Pt said the pain started about a month ago.

## 2012-05-25 NOTE — Progress Notes (Signed)
CRITICAL VALUE ALERT  Critical value received:  INR 6.02  Date of notification:  05/25/12  Time of notification:  0559  Critical value read back:yes  Nurse who received alert:  Reuel Boom RN  MD notified (1st page):  E-Link  Time of first page:  0600  MD notified (2nd page):  Time of second page:  Responding MD: Dr Denton Brick  Time MD responded:  0600

## 2012-05-26 ENCOUNTER — Ambulatory Visit: Payer: Self-pay | Admitting: Pharmacist

## 2012-05-26 ENCOUNTER — Telehealth: Payer: Self-pay | Admitting: Pharmacist

## 2012-05-26 ENCOUNTER — Inpatient Hospital Stay (HOSPITAL_COMMUNITY): Payer: No Typology Code available for payment source

## 2012-05-26 DIAGNOSIS — F411 Generalized anxiety disorder: Secondary | ICD-10-CM

## 2012-05-26 DIAGNOSIS — M545 Low back pain: Secondary | ICD-10-CM

## 2012-05-26 DIAGNOSIS — E875 Hyperkalemia: Secondary | ICD-10-CM | POA: Diagnosis not present

## 2012-05-26 DIAGNOSIS — E43 Unspecified severe protein-calorie malnutrition: Secondary | ICD-10-CM | POA: Diagnosis present

## 2012-05-26 DIAGNOSIS — S22080A Wedge compression fracture of T11-T12 vertebra, initial encounter for closed fracture: Secondary | ICD-10-CM | POA: Diagnosis present

## 2012-05-26 DIAGNOSIS — F419 Anxiety disorder, unspecified: Secondary | ICD-10-CM | POA: Diagnosis present

## 2012-05-26 DIAGNOSIS — R7989 Other specified abnormal findings of blood chemistry: Secondary | ICD-10-CM | POA: Diagnosis present

## 2012-05-26 DIAGNOSIS — I2699 Other pulmonary embolism without acute cor pulmonale: Secondary | ICD-10-CM

## 2012-05-26 DIAGNOSIS — I5032 Chronic diastolic (congestive) heart failure: Secondary | ICD-10-CM | POA: Diagnosis present

## 2012-05-26 LAB — CBC
MCH: 30.8 pg (ref 26.0–34.0)
MCHC: 31.7 g/dL (ref 30.0–36.0)
Platelets: 321 10*3/uL (ref 150–400)
RDW: 14.3 % (ref 11.5–15.5)

## 2012-05-26 LAB — BASIC METABOLIC PANEL
BUN: 16 mg/dL (ref 6–23)
Calcium: 9.8 mg/dL (ref 8.4–10.5)
GFR calc non Af Amer: >90 mL/min (ref >90)
Glucose, Bld: 141 mg/dL — ABNORMAL HIGH (ref 70–99)
Sodium: 138 mEq/L (ref 135–145)

## 2012-05-26 LAB — URINALYSIS, ROUTINE W REFLEX MICROSCOPIC
Bilirubin Urine: NEGATIVE
Hgb urine dipstick: NEGATIVE
Protein, ur: NEGATIVE mg/dL
Urobilinogen, UA: 2 mg/dL — ABNORMAL HIGH (ref 0.0–1.0)

## 2012-05-26 LAB — URINE MICROSCOPIC-ADD ON

## 2012-05-26 LAB — POCT INR: INR: 1.1

## 2012-05-26 LAB — PHOSPHORUS: Phosphorus: 2.6 mg/dL (ref 2.3–4.6)

## 2012-05-26 MED ORDER — CEFAZOLIN SODIUM 1-5 GM-% IV SOLN: 1.0000 g | INTRAVENOUS | Status: DC

## 2012-05-26 MED ORDER — LORAZEPAM 0.5 MG PO TABS: 0.5000 mg | ORAL_TABLET | Freq: Four times a day (QID) | ORAL | Status: DC | PRN

## 2012-05-26 MED ORDER — SODIUM POLYSTYRENE SULFONATE 15 GM/60ML PO SUSP
15.0000 g | Freq: Once | ORAL | Status: AC
  Administered 2012-05-26: 15 g via ORAL
  Filled 2012-05-26: qty 60

## 2012-05-26 MED ORDER — CEFAZOLIN SODIUM 1-5 GM-% IV SOLN
1.0000 g | INTRAVENOUS | Status: AC
  Filled 2012-05-26: qty 50

## 2012-05-26 NOTE — Progress Notes (Signed)
Patient VW:[id number] [name]      DOB: 08-20-1940      NWG:[id number]   Palliative Medicine Team at [facility] Progress Note    Subjective:Patient sitting up in bed, c/o of lower back pain. Has not been offered as needed pain medications on a regular basis prior to transfer to PCU. Will be going to IR in morning for kyphoplasty to T-12 fractured vertebrae.     Filed Vitals:   05/26/12 1352  BP: 140/69  Pulse: 135  Temp: 98.3 F (36.8 C)  Resp: 22   Physical exam: General: Appears anxious, cannot get comfortable in bed. 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 Patient on scheduled pain meds, has not been requesting BT pain medications as needed. Stated that the Lidoderm patch did help. Reinforced with staff on PCU to offer pain meds for breakthrough as ordered, use Morphine IV if oral meds not effective.  1) Pain: lower localized pain in lower thoracic at T-12 fracture site. Staff to offer BT meds (Percocet and Naprosyn)as ordered. Also ordered mattress overlay  2) Anxiety: will order Ativan as needed for comfort  3) Disposition: plan for discharge home with hospice support  Time In Time Out Total Time Spent with Patient Total Overall Time  4:45p 5:15p 20 min 30 min   [name], CNS-C Palliative Medicine Team [facility] Team Phone: [phone] Pager: [phone]

## 2012-05-26 NOTE — Progress Notes (Signed)
Pt and niece [name] selected [facility] for Home. Referral given.

## 2012-05-26 NOTE — Progress Notes (Signed)
Lab. notified RN of critical value : CO2 41. MD on call was notified. No new orders at this time.

## 2012-05-26 NOTE — Clinical Documentation Improvement (Signed)
MALNUTRITION DOCUMENTATION CLARIFICATION  THIS DOCUMENT IS NOT A PERMANENT PART OF THE MEDICAL RECORD  TO RESPOND TO THE THIS QUERY, FOLLOW THE INSTRUCTIONS BELOW:  1. If needed, update documentation for the patient's encounter via the notes activity.  2. Access this query again and click edit on the In [employer].  3. After updating, or not, click F2 to complete all highlighted (required) fields concerning your review. Select "additional documentation in the medical record" OR "no additional documentation provided".  4. Click Sign note button.  5. The deficiency will fall out of your In Basket *Please let us know if you are not able to complete this workflow by phone or e-mail (listed below).  Please update your documentation within the medical record to reflect your response to this query.                                                                                        05/26/12   Dear Dr. Salena Saner [facility],  In a better effort to capture your patient's severity of illness, reflect appropriate length of stay and utilization of resources, a review of the patient medical record has revealed the following indicators.    Based on your clinical judgment, please clarify and document in a progress note and/or discharge summary the clinical condition associated with the following supporting information:  In responding to this query please exercise your independent judgment.  The fact that a query is asked, does not imply that any particular answer is desired or expected.  05/25/12 Nutr Assessment w/ nutr dx of "Severe malnutrition in the context of chronic illness". For accurate Dx specificity & severity please help verify nutr documentation for clinical cond being eval'd, mon'd & tx'd. Thank you  Possible Clinical Conditions? . Mild Malnutrition  . Moderate Malnutrition . Severe Malnutrition   . Protein Calorie Malnutrition . Severe Protein Calorie Malnutrition  .  Emaciation  . Cachexia   Other Condition (please specify) Cannot Clinically Determine   Supporting Information: Risk Factors: See Nutrition Assessment 05/25/12  Signs & Symptoms: See Nutrition Assessment 05/25/12  -Diagnostics: See Nutrition Assessment 05/25/12  Treatments: See Nutrition Assessment 05/25/12  -Medications:  -Nutrition Consult: See Nutrition Assessment 05/25/12   You may use possible, probable, or suspect with inpatient documentation. possible, probable, suspected diagnoses MUST be documented at the time of discharge  Reviewed: additional documentation in the medical record  Thank You,  Toribio Harbour, RN, BSN, CCDS Certified Clinical Documentation Specialist Pager: [phone]  Health Information Management [place]

## 2012-05-26 NOTE — Progress Notes (Signed)
[facility] PROGRESS NOTE  HAJRA PORT ZOX:[id number] DOB: 12/24/1940 DOA: 05/24/2012 PCP: [name], MD  Brief narrative: [name] is a 71 year old female with past medical history of recurrent non-small cell lung cancer, adenocarcinoma (involving the right upper lobe and left upper lobe diagnosed in January 2007), osseous metastases, current therapy with tarceva, status post recent radiation to right proximal leg for osseous metastases, pulmonary embolism (on coumadin) who was transferred from cancer center on 05/24/12 for hypotension. Initial concern was for possible septic shock so PCCM was consulted. Patient's blood pressure responded nicely to fluid challenges and at this time she remains hemodynamically stable and has not required pressors  since the admission.   Assessment/Plan: Principal Problem:  *Hypotension   Initially thought to be due to possible sepsis. Seen by Dr. Sherene Sires of pulmonology on 05/24/2012.  SBP now within the normal range status post IV fluid boluses. Pressors not needed. IV fluids now discontinued.  Procalcitonin level not elevated.  CT chest 05/24/2012 without contrast showed possible pneumonia, no PE.    Hold antihypertensives Active Problems: Elevated BNP / chronic diastolic CHF   2 D ECHO done 05/25/2012 showed grade 1 diastolic dysfunction and an EF of 55-65%.  Appears to be well compensated. Severe malnutrition in the context of chronic illness  Seen by dietician 05/25/12. 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 She is a DO NOT RESUSCITATE. Wants aggressive symptom control. COPD (chronic obstructive pulmonary disease)   Stable.  Nebulizer treatments PRN Pulmonary embolism   Discontinue Coumadin per her wishes. Would use aspirin as a anti-coagulant for some protection, once her kyphoplasty is done. Hypokalemia / hyperkalemia   Monitor and replace as  needed.  On 05/26/2012, potassium was noted to be abnormally high. Kayexalate 15 g given. Metabolic alkalosis   Secondary to lasix T12 compression fracture  IR consultation for consideration of kyphoplasty. Patient requests aggressive symptom management. Also being treated with Decadron.  Code Status: DNR Family Communication: None at bedside. Disposition Plan: Home with hospice.  Lives with sister and niece/nephews.   Medical Consultants:  Jannette Fogo, NP, Palliative care  Dr. Shan Levans  Other Consultants:  Dietician  Anti-infectives:  None.  HPI/Subjective: [name] is complaining of back pain.  Rates it 7-8/10.  No N/V.  Appetite is "pretty good".  Bowels moved last night.  Has a dry cough.  No fever or chills.  Objective: Filed Vitals:   05/25/12 1600 05/25/12 1717 05/25/12 2158 05/26/12 0524  BP: 126/59 120/66 147/57 155/71  Pulse: 78 72 93 81  Temp: 98.6 F (37 C) 98.6 F (37 C) 98.1 F (36.7 C) 97.7 F (36.5 C)  TempSrc: Oral  Oral Oral  Resp: 23 20 20 20   Height:      Weight:      SpO2: 96% 93% 98% 98%    Intake/Output Summary (Last 24 hours) at 05/26/12 0811 Last data filed at 05/26/12 0741  Gross per 24 hour  Intake     53 ml  Output    300 ml  Net   -247 ml    Exam: Gen:  NAD Cardiovascular:  RRR, No M/R/G Respiratory:  Lungs diminished Gastrointestinal:  Abdomen softly distended, NT/ND, + BS Extremities:  1+ edema  Data Reviewed: Basic Metabolic Panel:  Lab 05/26/12 8119 05/25/12 1600 05/25/12 0530 05/24/12 2026 05/24/12 0815  NA 138 141 141 137 139  K 5.2* 4.0 -- -- --  CL 96 96 95* 94* 87*  CO2 41* 41* 42* 39* 41*  GLUCOSE 141* 123* 105* 168* 163*  BUN 16 15 11 13  12.0  CREATININE 0.46* 0.52 0.47* 0.52 0.7  CALCIUM 9.8 9.4 9.1 8.7 10.0  MG 1.8 -- -- 1.6 --  PHOS 2.6 -- -- 3.7 --   GFR Estimated Creatinine Clearance: 51.7 ml/min (by C-G formula based on Cr of 0.46). Liver Function Tests:  Lab 05/25/12 0530  05/24/12 2026 05/24/12 0815  AST 23 24 20   ALT 11 11 14   ALKPHOS 132* 135* 165*  BILITOT 0.6 0.4 0.99  PROT 5.4* 5.4* 6.5  ALBUMIN 2.4* 2.3* 2.9*   Coagulation profile  Lab 05/26/12 0550 05/25/12 0530 05/24/12 2053 05/24/12 1850 05/24/12 0822 05/24/12 0813  INR 1.10 6.02* 5.58* 5.87* 5.05* --  PROTIME -- -- -- -- -- Sent out for confirmation.    CBC:  Lab 05/26/12 0550 05/25/12 0530 05/24/12 2055 05/24/12 1850 05/24/12 0813  WBC 6.1 6.7 9.0 5.8 9.4  NEUTROABS -- -- 8.0* 5.3 8.2*  HGB 10.5* 9.8* 10.6* 7.0* 12.6  HCT 33.1* 30.2* 30.9* 21.8* 38.5  MCV 97.1 94.4 92.5 93.2 93.9  PLT 321 295 291 187 346   BNP (last 3 results)  Basename 05/24/12 1220 12/07/11 0233  PROBNP 1946.0* 11771.0*   CBG:  Lab 05/26/12 0744 05/25/12 0820  GLUCAP 132* 127*   Thyroid function studies  Basename 05/24/12 1850  TSH 0.365  T4TOTAL --  T3FREE --  THYROIDAB --   Microbiology Recent Results (from the past 240 hour(s))  MRSA PCR SCREENING     Status: Normal   Collection Time   05/24/12  5:39 PM      Component Value Range Status Comment   MRSA by PCR NEGATIVE  NEGATIVE Final      Procedures and Diagnostic Studies:  Dg Chest 2 View 05/24/2012  IMPRESSION: Stable chronic changes as described.  No superimposed acute process.   Original Report Authenticated By: Judie Petit. Miles Costain, M.D.     Ct Chest Wo Contrast 05/24/2012 IMPRESSION:  1.  Persistent perihilar mass lesions. 2.  Worsening pulmonary metastasis. 3.  Right lower lobe airspace consolidation / pneumonia with possible endobronchial tumor. 4.  Stable advanced atherosclerotic changes. 5.  T12 compression fracture, likely pathologic.   Original Report Authenticated By: Rudie Meyer, M.D.    Dg Chest Port 1 View 05/26/2012 IMPRESSION:  1.  Medial right basilar air space consolidation is unchanged. 2.  Nodular pulmonary metastases.   Original Report Authenticated By: Leanna Battles, M.D.     2 D Echocardiogram 05/25/12:  Grade 1 diastolic  dysfunction and an EF of 55-65%.  See report for further details.  Scheduled Meds:   . [EXPIRED] sodium chloride   Intravenous STAT  . dexamethasone  4 mg Oral Q8H  . [COMPLETED] furosemide  40 mg Intravenous Once  . lidocaine  1 patch Transdermal Q24H  . OxyCODONE  15 mg Oral Q8H  . [COMPLETED] phytonadione (VITAMIN K) IV  5 mg Intravenous Once  . potassium chloride SA  20 mEq Oral BID  . [COMPLETED] potassium chloride  40 mEq Oral Q4H  . senna-docusate  1 tablet Oral BID  . sodium chloride  3 mL Intravenous Q12H  . Warfarin - Pharmacist Dosing Inpatient   Does not apply q1800   Continuous Infusions:   Time spent: 35 minutes.   LOS: 2 days   [name]  [facility] Pager [phone].  If 8PM-8AM, please contact night-coverage at [email or web address], password  [email or web address] 05/26/2012, 8:11 AM

## 2012-05-26 NOTE — Progress Notes (Signed)
Documentation of phone call from Junious Dresser.   [place] has opted for Hospice & will no longer be on Coumadin.  Pt archived in Dose Response.

## 2012-05-26 NOTE — Progress Notes (Signed)
PT Cancellation Note  Patient Details Name: [name] MRN: [id number] DOB: 21-Jul-1940   Cancelled Treatment:    Reason Eval/Treat Not Completed: Pain limiting ability to participate RN reports pt to be evaluated for possible kyphoplasty.  Will hold off until that decision has been made   Donnetta Hail 05/26/2012, 9:35 AM

## 2012-05-26 NOTE — Progress Notes (Signed)
Patient ID: [name], female   DOB: 1940-07-09, 71 y.o.   MRN: [id number] Request received for T12, L3 VP/KP on pt with history of debilitating back pain, metastatic lung carcinoma, COPD, CHF, prior PE , CVA and recent imaging studies revealing acute T12/L3 compression fractures. Imaging studies were reviewed by Dr. Deanne Coffer and he feels pt is candidate for T12/L3 KP. Additional PMH as below. Exam: pt awake/alert, having sig back pain with any movement, primarily mid back; chest- dim BS throughout with intermittent crackling cough; heart-tachy, but reg rhythm; abd- soft, +BS,NT; ext- sl dim strength right LE; no edema; sens fxn ok.  Mod- severe paravertebral tenderness at T12 region; mild paravertebral tenderness at L3 level  . Filed Vitals:   05/25/12 2158 05/26/12 0524 05/26/12 1339 05/26/12 1352  BP: 147/57 155/71 140/69 140/69  Pulse: 93 81 138 135  Temp: 98.1 F (36.7 C) 97.7 F (36.5 C) 98.3 F (36.8 C) 98.3 F (36.8 C)  TempSrc: Oral Oral Oral Oral  Resp: 20 20 22 22   Height:      Weight:      SpO2: 98% 98% 88% 88%   Past Medical History  Diagnosis Date  . Hypertension   . Heart murmur   . Hip fracture, left   . COPD (chronic obstructive pulmonary disease)   . Skin cancer   . Skin cancer   . 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 Scoliosis 11/01/11    l3  . History of chemotherapy     6 cycles carboplatin/paclitaxel,last dose 08/2006,again given 07/22/2010,alimta q 3weeks s/p 9 cycless  . History of radiation therapy 08/10/2007-09/07/2007    right paratracheal area/ Dr.Kinard, [place]  . History of radiation therapy 2007    bilateral sterotatic radiotherapy Dr.McMullen at [facility] completed 11/25/2005  . Anemia   . S/P radiation therapy 12/04/11 - 01/19/12    [facility] : Right Femur/3000 cGy/10 Fractions and Right Iliac Bone / 3500 cGy/14 Fractions  . Bilateral pulmonary embolism   . CVA (cerebral infarction) Documented 02/18/12    Lovenox  . SOB  (shortness of breath) on exertion Documented 02/18/12  . Pain   . Lung cancer 09/18/2005    rul =[facility] dx  . Cancer 11/01/11     mets rightfemur//iliac 5.6cm,&left iliac  . History of radiation therapy 04/2012    bone   Past Surgical History  Procedure Date  . Portacath placement   . Hip surgery     LEFT HIP  . Femur im nail 11/13/2011    Procedure: INTRAMEDULLARY (IM) NAIL FEMORAL;  Surgeon: Senaida Lange, MD;  Location: [facility] OR;  Service: Orthopedics;  Laterality: Right;  IM NAILING OF RIGHT HIP   Dg Chest 1 View  05/05/2012  *RADIOLOGY REPORT*  Clinical Data: Lung cancer, bone cancer, COPD.  CHEST - 1 VIEW  Comparison: None.  Findings: Plate-like linear densities in the perihilar regions are appreciated on this single lateral view.  Heart is normal size. Moderate compression fracture in the lower thoracic spine.  This is new since recent chest CT.  IMPRESSION: New lower thoracic compression fracture.   Original Report Authenticated By: Cyndie Chime, M.D.    Dg Chest 2 View  05/24/2012  *RADIOLOGY REPORT*  Clinical Data: Cough, congestion, lung cancer  CHEST - 2 VIEW  Comparison: 04/09/2012, 05/05/2012  Findings: Right IJ power port catheter tip in the SVC.  Stable heart size and vascularity.  No CHF.  Background emphysema noted. Stable  perihilar linear densities as before. Known pulmonary nodules are better seen by CT.  No definite superimposed CHF, effusion, definite pneumonia or pneumothorax.  Atherosclerosis of the aorta.  IMPRESSION: Stable chronic changes as described.  No superimposed acute process.   Original Report Authenticated By: Judie Petit. Miles Costain, M.D.    Ct Chest Wo Contrast  05/24/2012  *RADIOLOGY REPORT*  Clinical Data: Shortness of breath.  History of lung cancer.  CT CHEST WITHOUT CONTRAST  Technique:  Multidetector CT imaging of the chest was performed following the standard protocol without IV contrast.  Comparison: 04/09/2012.  Findings: The chest wall is stable.  The right-sided  Port-A-Cath is noted.  No supraclavicular or axillary mass or adenopathy.  Small scattered nodes are stable.  No definite breast masses.  Examination of the bony structures demonstrates a significant osteoporosis.  There is a new compression fracture of the T12 vertebral body.  Mild retropulsion of the superior posterior aspect but no significant canal compromise.  No sternal fracture or obvious sternal metastasis.  The heart is mildly enlarged but stable.  No pericardial effusion. There are stable mediastinal and hilar lymph nodes and stable advanced atherosclerotic changes involving the aorta and branch vessels.  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  Examination of the lung parenchyma demonstrates stable bilateral hilar masses.  Severe underlying emphysematous changes with numerous pulmonary metastasis.  The pulmonary lesions have enlarged since prior CT scan.  A right middle lobe lesion on image number 30 measures 9 mm and previously measured 7.5 mm.  A right lower lobe lesion adjacent to the spine on image number 34 measures 9.5 mm and previously measured 8 mm.  A left lower lobe nodule on image number 30 measures 6 mm and previously measured 5 mm.  There is a area of airspace consolidation in the right lower lobe, likely pneumonia with prominent air bronchograms.  Cannot exclude endobronchial tumor.  No pleural effusion.  The upper abdomen is grossly stable.  IMPRESSION:  1.  Persistent perihilar mass lesions. 2.  Worsening pulmonary metastasis. 3.  Right lower lobe airspace consolidation / pneumonia with possible endobronchial tumor. 4.  Stable advanced atherosclerotic changes. 5.  T12 compression fracture, likely pathologic.   Original Report Authenticated By: Rudie Meyer, M.D.    Ct Abdomen Pelvis W Contrast  05/05/2012  *RADIOLOGY REPORT*  Clinical Data: Metastatic lung carcinoma, receiving chemotherapy. History of prior  radiation therapy.  Bony metastatic disease.  New back abdominal pain, constipation  CT ABDOMEN AND PELVIS WITH CONTRAST  Technique:  Multidetector CT imaging of the abdomen and pelvis was performed following the standard protocol during bolus administration of intravenous contrast.  Contrast: OMNIPAQUE IOHEXOL 300 MG/ML  SOLN  Comparison: 04/09/2012  Findings: Stable bilateral lower lobe pulmonary nodules without significant change in size or number compatible with pulmonary metastases.  Right base atelectasis versus scarring noted.  Stable mild cardiac enlargement.  No pericardial or pleural effusion. Atherosclerosis of the lower thoracic aorta with calcification.  Abdomen:  Stable 5 mm tiny cyst in the left hepatic lobe lateral segment, image 20.  Stable minimal intrahepatic biliary prominence and common bile duct prominence without obstruction pattern. Hepatic and portal veins are patent.  No other definite or new hepatic abnormality.  Gallbladder, biliary system, pancreas, spleen, and kidneys are within normal limits for age and demonstrate no significant interval change.  Small sub centimeter right renal cyst again noted.  No renal obstruction or hydronephrosis.  Atherosclerosis  noted of the abdominal aorta with mild ectasia and tortuosity.  No significant aneurysm, dissection or occlusive process.  The adrenal glands redemonstrates diffuse nodular hypodense enlargement dating back to 01/30 /2012.  Index left adrenal measurement is 3.0 x 2.1 cm.  Negative for bowel obstruction, dilatation, or ileus.  Large amount retained stool throughout the entire colon compatible with constipation.  No abdominal free fluid, fluid collection, hemorrhage, or adenopathy.  Pelvis:  Urinary bladder unremarkable.  Stable 4 cm low density cystic lesion in the left adnexa, suspect ovarian cyst.  Uterus is atrophic.  No free fluid, fluid collection, hemorrhage, adenopathy, inguinal abnormality, or hernia.  Atherosclerotic  changes of the iliac vessels.  Postoperative changes of both hips with fixation hardware noted. Bones appear osteopenic.   Reconstruction images demonstrate superior endplate compression fractures of L3 and T12 without destructive osseous lesions, suspect osteoporotic fractures.  No significant retropulsion.  Stable mixed destructive osseous lesion in the right ilium as before measuring 5.9 x 4.9 cm, image 38.  Stable sclerotic osseous metastasis of the right femoral neck with a chronic pathologic fracture appearance.  No new osseous lesions appreciated.  IMPRESSION: New interval superior endplate compression fractures of L3 and T12.  Bilateral lower lobe pulmonary metastases, stable.  Stable osseous metastases  Chronic constipation  Stable low attenuation bilateral adrenal enlargement  Stable 4 cm left ovarian cyst  No significant or acute interval change   Original Report Authenticated By: Judie Petit. Ruel Favors, M.D.    Dg Chest Port 1 View  05/26/2012  *RADIOLOGY REPORT*  Clinical Data: Lung cancer.  PORTABLE CHEST - 1 VIEW  Comparison: CT chest and chest radiograph 11/18 the pain.  Findings: Trachea is midline.  Heart is enlarged.  Thoracic aorta is calcified and tortuous.  Right IJ Port-A-Cath tip projects over the SVC.  Biapical pleural thickening.  Emphysema.  Presumed confluent scarring or radiation fibrosis in the perihilar regions bilaterally.  There are scattered small pulmonary nodules, measuring up to approximately 10 mm in the left upper lobe. Right basilar airspace consolidation is unchanged.  No definite pleural fluid.  IMPRESSION:  1.  Medial right basilar air space consolidation is unchanged. 2.  Nodular pulmonary metastases.   Original Report Authenticated By: Leanna Battles, M.D.    Dg Abd Acute W/chest  05/05/2012  *RADIOLOGY REPORT*  Clinical Data: He is in the constipation, lung cancer, 5 cm.  ACUTE ABDOMEN SERIES (ABDOMEN 2 VIEW & CHEST 1 VIEW)  Comparison: 04/09/2012  Findings: Linear  densities in both perihilar regions are again noted, unchanged.  Right Port-A-Cath remains in place, stable. Mild cardiomegaly. There is hyperinflation of the lungs compatible with COPD.  No acute infiltrates or effusions.  A nonobstructive bowel gas pattern.  Moderate stool burden throughout the colon.  No organomegaly.  No free air.  Severe leftward scoliosis in the lumbar spine with associated degenerative changes.  Hardware noted in the proximal femurs bilaterally.  IMPRESSION: Moderate stool burden.  No obstruction or free air.  COPD, cardiomegaly.  Stable linear densities in the perihilar regions bilaterally.   Original Report Authenticated By: Cyndie Chime, M.D.     Results for orders placed during the hospital encounter of 05/24/12  URINALYSIS, ROUTINE W REFLEX MICROSCOPIC      Component Value Range   Color, Urine YELLOW  YELLOW   APPearance CLOUDY (*) CLEAR   Specific Gravity, Urine 1.009  1.005 - 1.030   pH 7.0  5.0 - 8.0   Glucose, UA NEGATIVE  NEGATIVE mg/dL  Hgb urine dipstick SMALL (*) NEGATIVE   Bilirubin Urine NEGATIVE  NEGATIVE   Ketones, ur NEGATIVE  NEGATIVE mg/dL   Protein, ur NEGATIVE  NEGATIVE mg/dL   Urobilinogen, UA 1.0  0.0 - 1.0 mg/dL   Nitrite NEGATIVE  NEGATIVE   Leukocytes, UA NEGATIVE  NEGATIVE  LACTIC ACID, PLASMA      Component Value Range   Lactic Acid, Venous 1.2  0.5 - 2.2 mmol/L  POTASSIUM      Component Value Range   Potassium 2.9 (*) 3.5 - 5.1 mEq/L  POCT I-STAT TROPONIN I      Component Value Range   Troponin i, poc 0.13 (*) 0.00 - 0.08 ng/mL   Comment NOTIFIED PHYSICIAN     Comment 3           URINE MICROSCOPIC-ADD ON      Component Value Range   Squamous Epithelial / LPF RARE  RARE   WBC, UA 0-2  <3 WBC/hpf   RBC / HPF 3-6  <3 RBC/hpf   Casts HYALINE CASTS (*) NEGATIVE   Urine-Other MUCOUS PRESENT    PRO B NATRIURETIC PEPTIDE      Component Value Range   Pro B Natriuretic peptide (BNP) 1946.0 (*) 0 - 125 pg/mL  PROCALCITONIN       Component Value Range   Procalcitonin <0.10    CORTISOL      Component Value Range   Cortisol, Plasma 26.8    CBC WITH DIFFERENTIAL      Component Value Range   WBC 5.8  4.0 - 10.5 K/uL   RBC 2.34 (*) 3.87 - 5.11 MIL/uL   Hemoglobin 7.0 (*) 12.0 - 15.0 g/dL   HCT 47.8 (*) 29.5 - 62.1 %   MCV 93.2  78.0 - 100.0 fL   MCH 29.9  26.0 - 34.0 pg   MCHC 32.1  30.0 - 36.0 g/dL   RDW 30.8  65.7 - 84.6 %   Platelets 187  150 - 400 K/uL   Neutrophils Relative 91 (*) 43 - 77 %   Neutro Abs 5.3  1.7 - 7.7 K/uL   Lymphocytes Relative 3 (*) 12 - 46 %   Lymphs Abs 0.2 (*) 0.7 - 4.0 K/uL   Monocytes Relative 6  3 - 12 %   Monocytes Absolute 0.3  0.1 - 1.0 K/uL   Eosinophils Relative 0  0 - 5 %   Eosinophils Absolute 0.0  0.0 - 0.7 K/uL   Basophils Relative 0  0 - 1 %   Basophils Absolute 0.0  0.0 - 0.1 K/uL  APTT      Component Value Range   aPTT 66 (*) 24 - 37 seconds  PROTIME-INR      Component Value Range   Prothrombin Time 48.6 (*) 11.6 - 15.2 seconds   INR 5.87 (*) 0.00 - 1.49  TSH      Component Value Range   TSH 0.365  0.350 - 4.500 uIU/mL  URINALYSIS, ROUTINE W REFLEX MICROSCOPIC      Component Value Range   Color, Urine YELLOW  YELLOW   APPearance TURBID (*) CLEAR   Specific Gravity, Urine 1.022  1.005 - 1.030   pH 7.0  5.0 - 8.0   Glucose, UA NEGATIVE  NEGATIVE mg/dL   Hgb urine dipstick NEGATIVE  NEGATIVE   Bilirubin Urine NEGATIVE  NEGATIVE   Ketones, ur NEGATIVE  NEGATIVE mg/dL   Protein, ur NEGATIVE  NEGATIVE mg/dL   Urobilinogen, UA 2.0 (*) 0.0 -  1.0 mg/dL   Nitrite NEGATIVE  NEGATIVE   Leukocytes, UA SMALL (*) NEGATIVE  COMPREHENSIVE METABOLIC PANEL      Component Value Range   Sodium 141  135 - 145 mEq/L   Potassium 2.9 (*) 3.5 - 5.1 mEq/L   Chloride 95 (*) 96 - 112 mEq/L   CO2 42 (*) 19 - 32 mEq/L   Glucose, Bld 105 (*) 70 - 99 mg/dL   BUN 11  6 - 23 mg/dL   Creatinine, Ser 4.54 (*) 0.50 - 1.10 mg/dL   Calcium 9.1  8.4 - 09.8 mg/dL   Total Protein 5.4 (*)  6.0 - 8.3 g/dL   Albumin 2.4 (*) 3.5 - 5.2 g/dL   AST 23  0 - 37 U/L   ALT 11  0 - 35 U/L   Alkaline Phosphatase 132 (*) 39 - 117 U/L   Total Bilirubin 0.6  0.3 - 1.2 mg/dL   GFR calc non Af Amer >90  >90 mL/min   GFR calc Af Amer >90  >90 mL/min  CBC      Component Value Range   WBC 6.7  4.0 - 10.5 K/uL   RBC 3.20 (*) 3.87 - 5.11 MIL/uL   Hemoglobin 9.8 (*) 12.0 - 15.0 g/dL   HCT 11.9 (*) 14.7 - 82.9 %   MCV 94.4  78.0 - 100.0 fL   MCH 30.6  26.0 - 34.0 pg   MCHC 32.5  30.0 - 36.0 g/dL   RDW 56.2  13.0 - 86.5 %   Platelets 295  150 - 400 K/uL  MRSA PCR SCREENING      Component Value Range   MRSA by PCR NEGATIVE  NEGATIVE  PROTIME-INR      Component Value Range   Prothrombin Time 49.5 (*) 11.6 - 15.2 seconds   INR 6.02 (*) 0.00 - 1.49  COMPREHENSIVE METABOLIC PANEL      Component Value Range   Sodium 137  135 - 145 mEq/L   Potassium 3.0 (*) 3.5 - 5.1 mEq/L   Chloride 94 (*) 96 - 112 mEq/L   CO2 39 (*) 19 - 32 mEq/L   Glucose, Bld 168 (*) 70 - 99 mg/dL   BUN 13  6 - 23 mg/dL   Creatinine, Ser 7.84  0.50 - 1.10 mg/dL   Calcium 8.7  8.4 - 69.6 mg/dL   Total Protein 5.4 (*) 6.0 - 8.3 g/dL   Albumin 2.3 (*) 3.5 - 5.2 g/dL   AST 24  0 - 37 U/L   ALT 11  0 - 35 U/L   Alkaline Phosphatase 135 (*) 39 - 117 U/L   Total Bilirubin 0.4  0.3 - 1.2 mg/dL   GFR calc non Af Amer >90  >90 mL/min   GFR calc Af Amer >90  >90 mL/min  MAGNESIUM      Component Value Range   Magnesium 1.6  1.5 - 2.5 mg/dL  PHOSPHORUS      Component Value Range   Phosphorus 3.7  2.3 - 4.6 mg/dL  TYPE AND SCREEN      Component Value Range   ABO/RH(D) O POS     Antibody Screen NEG     Sample Expiration 05/27/2012    CBC WITH DIFFERENTIAL      Component Value Range   WBC 9.0  4.0 - 10.5 K/uL   RBC 3.34 (*) 3.87 - 5.11 MIL/uL   Hemoglobin 10.6 (*) 12.0 - 15.0 g/dL   HCT 29.5 (*) 28.4 -  46.0 %   MCV 92.5  78.0 - 100.0 fL   MCH 31.7  26.0 - 34.0 pg   MCHC 34.3  30.0 - 36.0 g/dL   RDW 16.1  09.6 -  04.5 %   Platelets 291  150 - 400 K/uL   Neutrophils Relative 89 (*) 43 - 77 %   Lymphocytes Relative 2 (*) 12 - 46 %   Monocytes Relative 9  3 - 12 %   Eosinophils Relative 0  0 - 5 %   Basophils Relative 0  0 - 1 %   Neutro Abs 8.0 (*) 1.7 - 7.7 K/uL   Lymphs Abs 0.2 (*) 0.7 - 4.0 K/uL   Monocytes Absolute 0.8  0.1 - 1.0 K/uL   Eosinophils Absolute 0.0  0.0 - 0.7 K/uL   Basophils Absolute 0.0  0.0 - 0.1 K/uL   RBC Morphology POLYCHROMASIA PRESENT     WBC Morphology INCREASED BANDS (>20% BANDS)    PROTIME-INR      Component Value Range   Prothrombin Time 46.8 (*) 11.6 - 15.2 seconds   INR 5.58 (*) 0.00 - 1.49  GLUCOSE, CAPILLARY      Component Value Range   Glucose-Capillary 127 (*) 70 - 99 mg/dL   Comment 1 Documented in Chart     Comment 2 Notify RN    BASIC METABOLIC PANEL      Component Value Range   Sodium 141  135 - 145 mEq/L   Potassium 4.0  3.5 - 5.1 mEq/L   Chloride 96  96 - 112 mEq/L   CO2 41 (*) 19 - 32 mEq/L   Glucose, Bld 123 (*) 70 - 99 mg/dL   BUN 15  6 - 23 mg/dL   Creatinine, Ser 4.09  0.50 - 1.10 mg/dL   Calcium 9.4  8.4 - 81.1 mg/dL   GFR calc non Af Amer >90  >90 mL/min   GFR calc Af Amer >90  >90 mL/min  PROTIME-INR      Component Value Range   Prothrombin Time 14.1  11.6 - 15.2 seconds   INR 1.10  0.00 - 1.49  BASIC METABOLIC PANEL      Component Value Range   Sodium 138  135 - 145 mEq/L   Potassium 5.2 (*) 3.5 - 5.1 mEq/L   Chloride 96  96 - 112 mEq/L   CO2 41 (*) 19 - 32 mEq/L   Glucose, Bld 141 (*) 70 - 99 mg/dL   BUN 16  6 - 23 mg/dL   Creatinine, Ser 9.14 (*) 0.50 - 1.10 mg/dL   Calcium 9.8  8.4 - 78.2 mg/dL   GFR calc non Af Amer >90  >90 mL/min   GFR calc Af Amer >90  >90 mL/min  CBC      Component Value Range   WBC 6.1  4.0 - 10.5 K/uL   RBC 3.41 (*) 3.87 - 5.11 MIL/uL   Hemoglobin 10.5 (*) 12.0 - 15.0 g/dL   HCT 95.6 (*) 21.3 - 08.6 %   MCV 97.1  78.0 - 100.0 fL   MCH 30.8  26.0 - 34.0 pg   MCHC 31.7  30.0 - 36.0 g/dL   RDW  57.8  46.9 - 62.9 %   Platelets 321  150 - 400 K/uL  PHOSPHORUS      Component Value Range   Phosphorus 2.6  2.3 - 4.6 mg/dL  MAGNESIUM      Component Value Range   Magnesium 1.8  1.5 -  2.5 mg/dL  GLUCOSE, CAPILLARY      Component Value Range   Glucose-Capillary 132 (*) 70 - 99 mg/dL   Comment 1 Notify RN    URINE MICROSCOPIC-ADD ON      Component Value Range   Squamous Epithelial / LPF RARE  RARE   WBC, UA 3-6  <3 WBC/hpf   Bacteria, UA FEW (*) RARE   Casts HYALINE CASTS (*) NEGATIVE   A/P: Pt with metastatic lung carcinoma and symptomatic compression fractures at T12/L3 levels. Currently awaiting transfer to palliative care. Tent plans at this time are for T12/L3 KP with possible biopsy on 11/21. Details/risks of procedure d/w pt /pt's niece with their understanding and consent. Since IV conscious sedation will be administered during case would like to have O2 SATS above 90% preprocedure and HR preferably under 100. Pt's tachycardia may be pain mediated. Will place orders for case and reevaluate in am. If VSS, pt afebrile and labs ok will plan to proceed. Check urine culture results.

## 2012-05-26 NOTE — Progress Notes (Signed)
Notified by Adela Ports CMRN patient/ family requests services of [facility]) after discharge; patient seen at bedside staff RN Lamar Laundry and PMT NP M. Moore at bedside patient experiencing increased pain/SOB after transferring to bed - chart reviewed -noted procedure planned for tomorrow; at this time patient not ready for discharge; will plan to follow up with patient/ family tomorrow; will continue to be available to assist with discharge as needed.   Valente David, RN 05/26/2012, 4:48 PM [facility] Palliative Medicine Team RN Liaison [phone]

## 2012-05-27 ENCOUNTER — Inpatient Hospital Stay (HOSPITAL_COMMUNITY): Payer: No Typology Code available for payment source

## 2012-05-27 ENCOUNTER — Telehealth: Payer: Self-pay | Admitting: *Deleted

## 2012-05-27 DIAGNOSIS — R1013 Epigastric pain: Secondary | ICD-10-CM

## 2012-05-27 DIAGNOSIS — K30 Functional dyspepsia: Secondary | ICD-10-CM

## 2012-05-27 DIAGNOSIS — J9610 Chronic respiratory failure, unspecified whether with hypoxia or hypercapnia: Secondary | ICD-10-CM | POA: Diagnosis present

## 2012-05-27 DIAGNOSIS — K5900 Constipation, unspecified: Secondary | ICD-10-CM

## 2012-05-27 LAB — CBC
Platelets: 368 10*3/uL (ref 150–400)
RBC: 3.36 MIL/uL — ABNORMAL LOW (ref 3.87–5.11)
WBC: 8.5 10*3/uL (ref 4.0–10.5)

## 2012-05-27 LAB — BASIC METABOLIC PANEL
CO2: 39 mEq/L — ABNORMAL HIGH (ref 19–32)
Calcium: 9.8 mg/dL (ref 8.4–10.5)
GFR calc non Af Amer: >90 mL/min (ref >90)
Sodium: 138 mEq/L (ref 135–145)

## 2012-05-27 LAB — URINE CULTURE

## 2012-05-27 MED ORDER — IPRATROPIUM BROMIDE 0.02 % IN SOLN
0.5000 mg | Freq: Four times a day (QID) | RESPIRATORY_TRACT | Status: DC
  Administered 2012-05-27 – 2012-05-28 (×3): 0.5 mg via RESPIRATORY_TRACT
  Filled 2012-05-27 (×3): qty 2.5

## 2012-05-27 MED ORDER — OXYCODONE HCL ER 20 MG PO T12A
20.0000 mg | EXTENDED_RELEASE_TABLET | Freq: Three times a day (TID) | ORAL | Status: DC
  Administered 2012-05-27 – 2012-05-28 (×3): 20 mg via ORAL
  Filled 2012-05-27 (×3): qty 1

## 2012-05-27 MED ORDER — ALBUTEROL SULFATE (5 MG/ML) 0.5% IN NEBU
2.5000 mg | INHALATION_SOLUTION | Freq: Four times a day (QID) | RESPIRATORY_TRACT | Status: DC
  Administered 2012-05-27 – 2012-05-28 (×3): 2.5 mg via RESPIRATORY_TRACT
  Filled 2012-05-27 (×3): qty 0.5

## 2012-05-27 MED ORDER — ASPIRIN EC 81 MG PO TBEC
81.0000 mg | DELAYED_RELEASE_TABLET | Freq: Every day | ORAL | Status: DC
  Administered 2012-05-27: 81 mg via ORAL
  Filled 2012-05-27 (×2): qty 1

## 2012-05-27 MED ORDER — LIDOCAINE 5 % EX PTCH
2.0000 | MEDICATED_PATCH | CUTANEOUS | Status: DC
  Administered 2012-05-27: 2 via TRANSDERMAL
  Filled 2012-05-27 (×2): qty 2

## 2012-05-27 MED ORDER — OXYCODONE HCL 5 MG PO TABS
10.0000 mg | ORAL_TABLET | ORAL | Status: DC | PRN
  Administered 2012-05-27 – 2012-05-28 (×3): 10 mg via ORAL
  Filled 2012-05-27: qty 1
  Filled 2012-05-27 (×4): qty 2
  Filled 2012-05-27: qty 1

## 2012-05-27 MED ORDER — MORPHINE SULFATE 2 MG/ML IJ SOLN: 1.0000 mg | INTRAMUSCULAR | Status: DC | PRN

## 2012-05-27 MED ORDER — FENTANYL CITRATE 0.05 MG/ML IJ SOLN
INTRAMUSCULAR | Status: AC
  Filled 2012-05-27: qty 6

## 2012-05-27 MED ORDER — ALUM & MAG HYDROXIDE-SIMETH 200-200-20 MG/5ML PO SUSP: 30.0000 mL | Freq: Two times a day (BID) | ORAL | Status: DC

## 2012-05-27 MED ORDER — MORPHINE SULFATE 4 MG/ML IJ SOLN: 4.0000 mg | INTRAMUSCULAR | Status: DC | PRN

## 2012-05-27 MED ORDER — LIDOCAINE HCL 1 % IJ SOLN
INTRAMUSCULAR | Status: AC
  Filled 2012-05-27: qty 20

## 2012-05-27 MED ORDER — MIDAZOLAM HCL 2 MG/2ML IJ SOLN
INTRAMUSCULAR | Status: AC
  Filled 2012-05-27: qty 6

## 2012-05-27 MED ORDER — BISACODYL 10 MG RE SUPP: 10.0000 mg | Freq: Every day | RECTAL | Status: DC | PRN

## 2012-05-27 MED ORDER — OXYCODONE-ACETAMINOPHEN 5-325 MG PO TABS
1.0000 | ORAL_TABLET | ORAL | Status: DC | PRN
  Administered 2012-05-27: 2 via ORAL

## 2012-05-27 NOTE — Progress Notes (Signed)
[facility] PROGRESS NOTE  [name] ZOX:[id number] DOB: May 26, 1941 DOA: 05/24/2012 PCP: [name], MD  Brief narrative: [name] is a 71 year old female with past medical history of recurrent non-small cell lung cancer, adenocarcinoma (involving the right upper lobe and left upper lobe diagnosed in January 2007), osseous metastases, current therapy with tarceva, status post recent radiation to right proximal leg for osseous metastases, pulmonary embolism (on coumadin) who was transferred from cancer center on 05/24/12 for hypotension. Initial concern was for possible septic shock so PCCM was consulted. Patient's blood pressure responded nicely to fluid challenges and at this time she remains hemodynamically stable and has not required pressors  since the admission. For possible kyphoplasty today, to address lower back pain related to compression fracture of L2.   Assessment/Plan: Principal Problem:  *Hypotension   Initially thought to be due to possible sepsis. Seen by Dr. Sherene Sires of pulmonology on 05/24/2012.  SBP now within the normal range status post IV fluid boluses. Pressors not needed. IV fluids now discontinued.  Procalcitonin level not elevated.  CT chest 05/24/2012 without contrast showed possible pneumonia, no PE.    Continue to hold antihypertensives Active Problems: Elevated BNP / chronic diastolic CHF   2 D ECHO done 05/25/2012 showed grade 1 diastolic dysfunction and an EF of 55-65%.  Appears to be well compensated. Severe malnutrition in the context of chronic illness  Seen by dietician 05/25/12. 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 She is a DO NOT RESUSCITATE. Wants aggressive symptom control. Moved to the palliative care unit on 05/26/2012. COPD (chronic obstructive pulmonary disease) / Chronic respiratory failure   Stable. Continue supplemental oxygen.  Nebulizer  treatments changed to Q 6 hours ATC, due to active wheezing. Pulmonary embolism   Discontinue Coumadin per her wishes. ASA started. Hypokalemia / hyperkalemia   Monitor and replace as needed.  On 05/26/2012, potassium was noted to be abnormally high. Kayexalate 15 g given. Metabolic alkalosis   Secondary to lasix. T12 compression fracture  IR consultation done 05/26/2012 for consideration of kyphoplasty. Scheduled for possible kyphoplasty today, but patient declined procedure. Continue Decadron.  Increase OxyContin to 20 mg Q 8 hours, and increase PRN morphine to 4 mg.  Home once pain controlled.  Code Status: DNR Family Communication: None at bedside. Disposition Plan: Home with hospice.  Lives with sister and niece/nephews.   Medical Consultants:  Jannette Fogo, NP, Palliative care  Dr. Shan Levans  Other Consultants:  Dietician  Anti-infectives:  None.  HPI/Subjective: [name] complains of uncontrolled back pain.  Declined having kyphoplasty today, after reading about procedure on internet.  Says she will consider having it done as an outpatient.  More dyspneic today.  Has home oxygen.  Objective: Filed Vitals:   05/26/12 0524 05/26/12 1339 05/26/12 1352 05/27/12 0620  BP: 155/71 140/69 140/69 161/90  Pulse: 81 138 135 100  Temp: 97.7 F (36.5 C) 98.3 F (36.8 C) 98.3 F (36.8 C) 97.7 F (36.5 C)  TempSrc: Oral Oral Oral Oral  Resp: 20 22 22 26   Height:      Weight:      SpO2: 98% 88% 88% 99%    Intake/Output Summary (Last 24 hours) at 05/27/12 0735 Last data filed at 05/26/12 0741  Gross per 24 hour  Intake      0 ml  Output    300 ml  Net   -300 ml  Exam: Gen:  NAD Cardiovascular:  RRR, No M/R/G Respiratory:  Lungs very course with expiratory wheezes and rhonchi Gastrointestinal:  Abdomen softly distended, NT/ND, + BS Extremities:  1+ edema  Data Reviewed: Basic Metabolic Panel:  Lab 05/27/12 4782 05/26/12 0550 05/25/12 1600  05/25/12 0530 05/24/12 2026  NA 138 138 141 141 137  K 4.5 5.2* -- -- --  CL 94* 96 96 95* 94*  CO2 39* 41* 41* 42* 39*  GLUCOSE 116* 141* 123* 105* 168*  BUN 13 16 15 11 13   CREATININE 0.38* 0.46* 0.52 0.47* 0.52  CALCIUM 9.8 9.8 9.4 9.1 8.7  MG -- 1.8 -- -- 1.6  PHOS -- 2.6 -- -- 3.7   GFR Estimated Creatinine Clearance: 51.7 ml/min (by C-G formula based on Cr of 0.38). Liver Function Tests:  Lab 05/25/12 0530 05/24/12 2026 05/24/12 0815  AST 23 24 20   ALT 11 11 14   ALKPHOS 132* 135* 165*  BILITOT 0.6 0.4 0.99  PROT 5.4* 5.4* 6.5  ALBUMIN 2.4* 2.3* 2.9*   Coagulation profile  Lab 05/26/12 0550 05/26/12 05/25/12 0530 05/24/12 2053 05/24/12 1850 05/24/12 0813  INR 1.10 1.1 6.02* 5.58* 5.87* --  PROTIME -- -- -- -- -- Sent out for confirmation.    CBC:  Lab 05/27/12 0630 05/26/12 0550 05/25/12 0530 05/24/12 2055 05/24/12 1850  WBC 8.5 6.1 6.7 9.0 5.8  NEUTROABS -- -- -- 8.0* 5.3  HGB 10.5* 10.5* 9.8* 10.6* 7.0*  HCT 32.0* 33.1* 30.2* 30.9* 21.8*  MCV 95.2 97.1 94.4 92.5 93.2  PLT 368 321 295 291 187   BNP (last 3 results)  Basename 05/24/12 1220 12/07/11 0233  PROBNP 1946.0* 11771.0*   CBG:  Lab 05/26/12 0744 05/25/12 0820  GLUCAP 132* 127*   Thyroid function studies  Basename 05/24/12 1850  TSH 0.365  T4TOTAL --  T3FREE --  THYROIDAB --   Microbiology Recent Results (from the past 240 hour(s))  MRSA PCR SCREENING     Status: Normal   Collection Time   05/24/12  5:39 PM      Component Value Range Status Comment   MRSA by PCR NEGATIVE  NEGATIVE Final      Procedures and Diagnostic Studies:  Dg Chest 2 View 05/24/2012  IMPRESSION: Stable chronic changes as described.  No superimposed acute process.   Original Report Authenticated By: Judie Petit. Miles Costain, M.D.     Ct Chest Wo Contrast 05/24/2012 IMPRESSION:  1.  Persistent perihilar mass lesions. 2.  Worsening pulmonary metastasis. 3.  Right lower lobe airspace consolidation / pneumonia with possible  endobronchial tumor. 4.  Stable advanced atherosclerotic changes. 5.  T12 compression fracture, likely pathologic.   Original Report Authenticated By: Rudie Meyer, M.D.    Dg Chest Port 1 View 05/26/2012 IMPRESSION:  1.  Medial right basilar air space consolidation is unchanged. 2.  Nodular pulmonary metastases.   Original Report Authenticated By: Leanna Battles, M.D.     2 D Echocardiogram 05/25/12:  Grade 1 diastolic dysfunction and an EF of 55-65%.  See report for further details.  Scheduled Meds:    .  ceFAZolin (ANCEF) IV  1 g Intravenous On Call  . dexamethasone  4 mg Oral Q8H  . lidocaine  1 patch Transdermal Q24H  . OxyCODONE  15 mg Oral Q8H  . potassium chloride SA  20 mEq Oral BID  . senna-docusate  1 tablet Oral BID  . sodium chloride  3 mL Intravenous Q12H  . [COMPLETED] sodium polystyrene  15 g Oral  Once  . [DISCONTINUED]  ceFAZolin (ANCEF) IV  1 g Intravenous On Call  . [DISCONTINUED] Warfarin - Pharmacist Dosing Inpatient   Does not apply q1800   Continuous Infusions:   Time spent: 25 minutes.   LOS: 3 days   RAMA,CHRISTINA  [facility] Pager [phone].  If 8PM-8AM, please contact night-coverage at [email or web address], password [facility] 05/27/2012, 7:35 AM

## 2012-05-27 NOTE — Progress Notes (Addendum)
Patient [name]      DOB: 1941-03-18      MWN:[id number]   Palliative Medicine Team at [facility] Progress Note    Subjective:Patient sitting up in chair, back pain currently 6-7/10, refused to go for kyphoplasty, Oxycontin SR dose increased within last 24 hours. Also c/o of acid reflux  Filed Vitals:   05/27/12 0620  BP: 161/90  Pulse: 100  Temp: 97.7 F (36.5 C)  Resp: 26   Physical exam: General: Alert, oriented, sitting up in bedside chair HEENT: oxygen via nasal cannula, buccal mucosa moist CHEST:coarse rhonchi throughout , diminished at bases ABD: soft, non-tender, BS audible, no BM since admission QIH:KVQQV pedal edema NEURO: alert, oriented   Assessment and plan: Back pain still an issue, Oxycontin scheduled dose has been increased. Patient refused kyphoplasty procedure today  1)Pain: currently 6-7/10, localized to lower thoracic spine and surrounding area. Need to obtain control via oral route prior to discharge home.   Lidoderm Patch: Increased to 2 patches every 12 hours to area, patient states pain extends beyond area patch is applied  Oxycodone:breakthrough medication changed, frequency increased frequency from every 4 to every 3 hours-decreased  IV Morphine dose to 1-2 mg only to be used if oral BT meds not effective or for acute flares.  Oxycontin increased from 15 mg TID to 20 mg TID (dose increase done past 24 hours)  2) Constipation: last BM 4 days ago-usual pattern every 3-4 days, on Senakot S BID, staff will administer Miralax today, will order Dulcolax if not effective. 3) Indigestion: ordered Maalox/Mylanta every 12 hours   4) Disposition: plan is to return home with hospice support  Time In Time Out Total Time Spent with Patient Total Overall Time  11:30a 12 noon 20 min 30 min   Greater than 50%  of this time was spent counseling and coordinating care related to the above assessment and plan.  Freddie Breech,  [facility] Palliative Medicine Team [facility] Health Team Phone: [phone] Pager: [phone]

## 2012-05-27 NOTE — Telephone Encounter (Signed)
Pt is now on hospice and Dr Donnald Garre will be the attending MD with symptom mgmt through hospice.  Appt cancelled at [facility], informed Chip Boer at hospice.  SLJ

## 2012-05-27 NOTE — Progress Notes (Signed)
Follow-up on discharge plans- Patient information reviewed with Dr Elliot Gurney, [facility] Medical Director hospice eligible with dx: NSCLC.  Spoke with patient's niece Junious Dresser on phone [phone]) to initiate education related to hospice services, philosophy and team approach to care with good understanding.   Discharge date not yet finalized -per niece pt will need to be transported by non-emergent transport; patient continues to have pain control issues.   DME needs were discussed currently has O2; BSC, w/c and walker in the home from [facility] - niece requests complete pkg D: fully electric hospital bed with full rails; AP&P mattress and over-bed table and add trapeze bar and bed mount - to be delivered early tomorrow morning so that family has time to create space in the home for equipment  * please call Junious Dresser niece to arrange delivery time @ [phone] Patient's attending physician with hospice will be Dr Arbutus Ped; pharmacy [facility] in [place] Initial paperwork faxed to [facility]   Please notify [facility] when patient is ready to leave unit at d/c call [phone] (or [phone] if after 5 pm);  [facility] information and contact numbers also given to Junious Dresser during conversation.   Above information shared with M Pearson,CMRN and [facility] representative Please call with any questions or concerns   Valente David, RN 05/27/2012, 11:14 AM [facility] Palliative Medicine Team RN Liaison [phone]

## 2012-05-28 ENCOUNTER — Ambulatory Visit: Payer: No Typology Code available for payment source

## 2012-05-28 ENCOUNTER — Other Ambulatory Visit: Payer: No Typology Code available for payment source | Admitting: Lab

## 2012-05-28 MED ORDER — LIDOCAINE 5 % EX PTCH: 2.0000 | MEDICATED_PATCH | CUTANEOUS | Status: AC

## 2012-05-28 MED ORDER — ASPIRIN 81 MG PO TBEC: 81.0000 mg | DELAYED_RELEASE_TABLET | Freq: Every day | ORAL | Status: AC

## 2012-05-28 MED ORDER — OXYCODONE HCL ER 20 MG PO T12A: 20.0000 mg | EXTENDED_RELEASE_TABLET | Freq: Three times a day (TID) | ORAL | Status: AC

## 2012-05-28 MED ORDER — DEXAMETHASONE 4 MG PO TABS: 4.0000 mg | ORAL_TABLET | Freq: Three times a day (TID) | ORAL | Status: AC

## 2012-05-28 MED ORDER — HEPARIN SOD (PORK) LOCK FLUSH 100 UNIT/ML IV SOLN
INTRAVENOUS | Status: AC
  Filled 2012-05-28: qty 5

## 2012-05-28 MED ORDER — SENNOSIDES-DOCUSATE SODIUM 8.6-50 MG PO TABS: 1.0000 | ORAL_TABLET | Freq: Two times a day (BID) | ORAL | Status: AC

## 2012-05-28 MED ORDER — OXYCODONE HCL 10 MG PO TABS: 10.0000 mg | ORAL_TABLET | ORAL | Status: AC | PRN

## 2012-05-28 MED ORDER — LISINOPRIL 20 MG PO TABS
20.0000 mg | ORAL_TABLET | Freq: Every day | ORAL | Status: DC
  Administered 2012-05-28: 20 mg via ORAL
  Filled 2012-05-28: qty 1

## 2012-05-28 MED ORDER — LORAZEPAM 0.5 MG PO TABS: 0.5000 mg | ORAL_TABLET | Freq: Four times a day (QID) | ORAL | Status: AC | PRN

## 2012-05-28 NOTE — Discharge Summary (Signed)
Physician Discharge Summary  [name] ZOX:[id number] DOB: 1940-08-30 DOA: 05/24/2012  PCP: [name], MD  Admit date: 05/24/2012 Discharge date: 05/28/2012  Recommendations for Outpatient Follow-up:  1. The patient is going home with PT and hospice services.  Dr. Arbutus Ped will be her hospice MD.  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  Diet recommendation: Regular.  History of present illness:  [name] is a 71 year old female with past medical history of recurrent non-small cell lung cancer, adenocarcinoma (involving the right upper lobe and left upper lobe diagnosed in January 2007), osseous metastases, current therapy with tarceva, status post recent radiation to right proximal leg for osseous metastases, pulmonary embolism (on coumadin) who was transferred from cancer center on 05/24/12 for hypotension.  Hospital Course by problem:  Principal Problem:  *Hypotension / H/O HTN  Initially thought to be due to possible sepsis. Seen by Dr. Sherene Sires of pulmonology on 05/24/2012.  SBP normalized status post IV fluid boluses. Pressors not needed. IV fluids subsequently discontinued.  Procalcitonin  level not elevated. CT chest 05/24/2012 without contrast showed possible pneumonia, no PE.  OK to resume antihypertensives at discharge. BP now in hypertensive range.  Active Problems:  Elevated BNP / chronic diastolic CHF  2 D ECHO done 05/25/2012 showed grade 1 diastolic dysfunction and an EF of 55-65%.  Remains well compensated. Severe malnutrition in the context of chronic illness  Seen by dietician 05/25/12. 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 She is now a DO NOT RESUSCITATE status. Wants aggressive symptom control. Moved to the palliative care unit on 05/26/2012.  Will be discharged home with hospice. COPD (chronic obstructive pulmonary disease) / Chronic respiratory failure  Stable. Continue supplemental oxygen, which she has set up at home.  Treated with bronchodilator therapy in the hospital.  On Spiriva. Pulmonary embolism  Discontinued Coumadin per her wishes. ASA started. Hypokalemia / hyperkalemia  Resolved. Metabolic alkalosis  Secondary to chronic respiratory failure.  Did not improve with holding lasix, so not likely contraction alkalosis. T12 compression fracture  IR consultation done 05/26/2012 for consideration of kyphoplasty. Scheduled for possible kyphoplasty 05/27/12, but patient declined procedure. Continue Decadron. D/C home on OxyContin to 20 mg Q 8 hours, and OxyIR 10 mg Q 3 hours PRN.  Medical Consultants:  Jannette Fogo, NP, Palliative care  Dr. Shan Levans Other Consultants:  Dietician  Discharge Exam: Filed Vitals:   05/28/12 0545  BP: 183/100  Pulse: 96  Temp: 98.3 F (36.8 C)  Resp: 20   Filed Vitals:   05/26/12 1352 05/27/12 0620 05/27/12 1426 05/28/12 0545  BP: 140/69 161/90  183/100  Pulse: 135 100  96  Temp: 98.3 F (36.8 C) 97.7 F (36.5 C)  98.3 F (36.8 C)  TempSrc: Oral Oral  Oral  Resp: 22 26  20   Height:      Weight:      SpO2: 88% 99% 99% 100%      Gen:  NAD Cardiovascular:  RRR, No M/R/G Respiratory: Lungs CTAB, diminished in the bases Gastrointestinal: Abdomen softly distended, NT/ND with normal active bowel sounds. Extremities: 1+ edema   Discharge Instructions  Discharge Orders    Future Orders Please Complete By Expires   Diet general      Increase activity slowly      Call MD for:  severe uncontrolled pain      Home Health      Questions: Responses:   To provide the following care/treatments PT   Face-to-face encounter      Comments:   I Iyauna Sing certify that this patient is under my care and that I, or a nurse practitioner or physician's assistant working with me, had a face-to-face encounter that meets the physician face-to-face encounter requirements with this patient on 05/28/2012. The encounter with the patient was in whole, or in part for the following medical condition(s) which is the primary reason for home health care (List medical condition): Lumbar compression fracture with pain and mobility issues.   Questions: Responses:   The encounter with the patient was in whole, or in part, for the following medical condition, which is the primary reason for home health care Intractable back pain, compression fracture, lung cancer   I certify that, based on my findings, the following services are medically necessary home health services Physical therapy   My clinical findings support the need for the above services Pain interferes with ambulation/mobility   Further, I certify that my clinical findings support that this patient is homebound due to: Leaving home exacerbates symptoms (dyspnea, pain, anxiety, etc)   To provide the following care/treatments PT       Medication List     As of 05/28/2012  9:48 AM    STOP taking these medications         oxyCODONE 15 MG Tb12   Commonly known as: OXYCONTIN      oxyCODONE-acetaminophen 5-325 MG per tablet   Commonly known as: PERCOCET/ROXICET      TAKE these  medications         aspirin 81 MG EC tablet   Take 1 tablet (81 mg total) by mouth daily.      bisoprolol 5 MG tablet   Commonly known as: ZEBETA   Take 2.5 mg by mouth daily.      dexamethasone 4 MG tablet   Commonly known as: DECADRON   Take 1 tablet (4 mg total) by mouth every 8 (eight) hours.      furosemide 20 MG tablet   Commonly known as: LASIX   Take 1 tablet (20 mg total) by mouth 2 (two) times daily. For edema      lidocaine 5 %   Commonly known as: LIDODERM   Place 2 patches onto the skin daily. Remove & Discard patch within 12 hours or as directed by MD      lisinopril 20 MG tablet   Commonly known as: PRINIVIL,ZESTRIL   Take 20 mg by mouth daily.      LORazepam 0.5 MG tablet   Commonly known as: ATIVAN   Take 1 tablet (0.5 mg total) by mouth every 6 (six) hours as needed for anxiety.  naproxen sodium 220 MG tablet   Commonly known as: ANAPROX   Take 220 mg by mouth 2 (two) times daily as needed. For pain      Oxycodone HCl 10 MG Tabs   Take 1 tablet (10 mg total) by mouth every 3 (three) hours as needed.      OxyCODONE 20 mg T12a   Commonly known as: OXYCONTIN   Take 1 tablet (20 mg total) by mouth every 8 (eight) hours.      polyethylene glycol 236 G solution   Commonly known as: GoLYTELY/NuLYTELY   Take 240 mLs by mouth once as needed. Constipation, can repeat in 1 hour if no response      potassium chloride SA 20 MEQ tablet   Commonly known as: K-DUR,KLOR-CON   Take 20 mEq by mouth 2 (two) times daily.      prochlorperazine 10 MG tablet   Commonly known as: COMPAZINE   Take 1 tablet (10 mg total) by mouth every 6 (six) hours as needed. For nausea      senna-docusate 8.6-50 MG per tablet   Commonly known as: Senokot-S   Take 1 tablet by mouth 2 (two) times daily.      tiotropium 18 MCG inhalation capsule   Commonly known as: SPIRIVA   Place 18 mcg into inhaler and inhale daily.           Follow-up Information    Follow up with  [facility] ([facility] to follow aftr d/c -pls notify when pt ready to leave unit at d/c -call [phone] (or [phone] aftr 5 pm))    Contact information:   [facility] [address] [place], [place] [phone]          The results of significant diagnostics from this hospitalization (including imaging, microbiology, ancillary and laboratory) are listed below for reference.    Significant Diagnostic Studies: Dg Chest 2 View 05/24/2012 IMPRESSION: Stable chronic changes as described. No superimposed acute process. Original Report Authenticated By: Judie Petit. Miles Costain, M.D.  Ct Chest Wo Contrast 05/24/2012 IMPRESSION: 1. Persistent perihilar mass lesions. 2. Worsening pulmonary metastasis. 3. Right lower lobe airspace consolidation / pneumonia with possible endobronchial tumor. 4. Stable advanced atherosclerotic changes. 5. T12 compression fracture, likely pathologic. Original Report Authenticated By: Rudie Meyer, M.D.  Dg Chest Port 1 View 05/26/2012 IMPRESSION: 1. Medial right basilar air space consolidation is unchanged. 2. Nodular pulmonary metastases. Original Report Authenticated By: Leanna Battles, M.D.  2 D Echocardiogram 05/25/12: Grade 1 diastolic dysfunction and an EF of 55-65%. See report for further details.   Microbiology: Recent Results (from the past 240 hour(s))  MRSA PCR SCREENING     Status: Normal   Collection Time   05/24/12  5:39 PM      Component Value Range Status Comment   MRSA by PCR NEGATIVE  NEGATIVE Final   URINE CULTURE     Status: Normal   Collection Time   05/26/12 10:17 AM      Component Value Range Status Comment   Specimen Description URINE, RANDOM   Final    Special Requests NONE   Final    Culture  Setup Time 05/26/2012 22:32   Final    Colony Count 90,000 COLONIES/ML   Final    Culture     Final    Value: Multiple bacterial morphotypes present, none predominant. Suggest appropriate recollection if clinically indicated.   Report  Status 05/27/2012 FINAL   Final      Labs: Basic  Metabolic Panel:  Lab 05/27/12 6045 05/26/12 0550 05/25/12 1600 05/25/12 0530 05/24/12 2026  NA 138 138 141 141 137  K 4.5 5.2* 4.0 2.9* 3.0*  CL 94* 96 96 95* 94*  CO2 39* 41* 41* 42* 39*  GLUCOSE 116* 141* 123* 105* 168*  BUN 13 16 15 11 13   CREATININE 0.38* 0.46* 0.52 0.47* 0.52  CALCIUM 9.8 9.8 9.4 9.1 8.7  MG -- 1.8 -- -- 1.6  PHOS -- 2.6 -- -- 3.7   Liver Function Tests:  Lab 05/25/12 0530 05/24/12 2026 05/24/12 0815  AST 23 24 20   ALT 11 11 14   ALKPHOS 132* 135* 165*  BILITOT 0.6 0.4 0.99  PROT 5.4* 5.4* 6.5  ALBUMIN 2.4* 2.3* 2.9*   CBC:  Lab 05/27/12 0630 05/26/12 0550 05/25/12 0530 05/24/12 2055 05/24/12 1850  WBC 8.5 6.1 6.7 9.0 5.8  NEUTROABS -- -- -- 8.0* 5.3  HGB 10.5* 10.5* 9.8* 10.6* 7.0*  HCT 32.0* 33.1* 30.2* 30.9* 21.8*  MCV 95.2 97.1 94.4 92.5 93.2  PLT 368 321 295 291 187   BNP (last 3 results)  Basename 05/24/12 1220 12/07/11 0233  PROBNP 1946.0* 11771.0*   CBG:  Lab 05/26/12 0744 05/25/12 0820  GLUCAP 132* 127*    Time coordinating discharge: 35 minutes.  Signed:  Lorenza Shakir  Pager [phone] [facility] 05/28/2012, 9:48 AM

## 2012-05-28 NOTE — Progress Notes (Signed)
Noted patient with elevated B/P  At  183/100.  Paged covering M.D. [place] per parameters systolic above 160. Awaiting call back at this time. Pt with no s/s of distress at this time> will continue to monitor per M.D. Order and unit protocol.

## 2012-05-31 ENCOUNTER — Ambulatory Visit: Payer: No Typology Code available for payment source

## 2012-05-31 ENCOUNTER — Ambulatory Visit: Payer: No Typology Code available for payment source | Admitting: Internal Medicine

## 2012-05-31 ENCOUNTER — Other Ambulatory Visit: Payer: No Typology Code available for payment source

## 2012-05-31 ENCOUNTER — Other Ambulatory Visit: Payer: No Typology Code available for payment source | Admitting: Lab

## 2012-06-07 ENCOUNTER — Ambulatory Visit: Payer: No Typology Code available for payment source

## 2012-06-07 ENCOUNTER — Other Ambulatory Visit: Payer: No Typology Code available for payment source

## 2012-06-08 ENCOUNTER — Other Ambulatory Visit: Payer: No Typology Code available for payment source | Admitting: Lab

## 2012-06-14 ENCOUNTER — Ambulatory Visit: Payer: No Typology Code available for payment source

## 2012-06-14 ENCOUNTER — Other Ambulatory Visit: Payer: No Typology Code available for payment source | Admitting: Lab

## 2012-06-15 ENCOUNTER — Other Ambulatory Visit: Payer: No Typology Code available for payment source | Admitting: Lab

## 2012-06-21 ENCOUNTER — Ambulatory Visit: Payer: No Typology Code available for payment source

## 2012-06-21 ENCOUNTER — Other Ambulatory Visit: Payer: No Typology Code available for payment source | Admitting: Lab

## 2012-06-22 ENCOUNTER — Other Ambulatory Visit: Payer: No Typology Code available for payment source | Admitting: Lab

## 2012-06-28 ENCOUNTER — Ambulatory Visit: Payer: No Typology Code available for payment source

## 2012-06-28 ENCOUNTER — Other Ambulatory Visit: Payer: No Typology Code available for payment source

## 2012-06-29 ENCOUNTER — Other Ambulatory Visit: Payer: No Typology Code available for payment source

## 2012-07-05 ENCOUNTER — Ambulatory Visit: Payer: No Typology Code available for payment source

## 2012-07-05 ENCOUNTER — Other Ambulatory Visit: Payer: No Typology Code available for payment source | Admitting: Lab

## 2012-07-06 ENCOUNTER — Other Ambulatory Visit: Payer: No Typology Code available for payment source

## 2012-07-12 ENCOUNTER — Other Ambulatory Visit: Payer: No Typology Code available for payment source

## 2012-07-12 ENCOUNTER — Ambulatory Visit: Payer: No Typology Code available for payment source

## 2012-07-19 ENCOUNTER — Other Ambulatory Visit: Payer: No Typology Code available for payment source

## 2012-07-19 ENCOUNTER — Emergency Department (HOSPITAL_COMMUNITY): Payer: No Typology Code available for payment source

## 2012-07-19 ENCOUNTER — Encounter (HOSPITAL_COMMUNITY): Payer: Self-pay | Admitting: Emergency Medicine

## 2012-07-19 ENCOUNTER — Emergency Department (HOSPITAL_COMMUNITY)
Admission: EM | Admit: 2012-07-19 | Discharge: 2012-07-20 | Disposition: A | Discharge status: Home / Self Care | Payer: No Typology Code available for payment source | Attending: Emergency Medicine | Admitting: Emergency Medicine

## 2012-07-19 DIAGNOSIS — Z8709 Personal history of other diseases of the respiratory system: Secondary | ICD-10-CM | POA: Insufficient documentation

## 2012-07-19 DIAGNOSIS — Z862 Personal history of diseases of the blood and blood-forming organs and certain disorders involving the immune mechanism: Secondary | ICD-10-CM | POA: Insufficient documentation

## 2012-07-19 DIAGNOSIS — Z8781 Personal history of (healed) traumatic fracture: Secondary | ICD-10-CM | POA: Insufficient documentation

## 2012-07-19 DIAGNOSIS — J449 Chronic obstructive pulmonary disease, unspecified: Secondary | ICD-10-CM | POA: Insufficient documentation

## 2012-07-19 DIAGNOSIS — Z8673 Personal history of transient ischemic attack (TIA), and cerebral infarction without residual deficits: Secondary | ICD-10-CM | POA: Insufficient documentation

## 2012-07-19 DIAGNOSIS — F17200 Nicotine dependence, unspecified, uncomplicated: Secondary | ICD-10-CM | POA: Insufficient documentation

## 2012-07-19 DIAGNOSIS — R0989 Other specified symptoms and signs involving the circulatory and respiratory systems: Secondary | ICD-10-CM | POA: Insufficient documentation

## 2012-07-19 DIAGNOSIS — Z79899 Other long term (current) drug therapy: Secondary | ICD-10-CM | POA: Insufficient documentation

## 2012-07-19 DIAGNOSIS — Z86711 Personal history of pulmonary embolism: Secondary | ICD-10-CM | POA: Insufficient documentation

## 2012-07-19 DIAGNOSIS — C3490 Malignant neoplasm of unspecified part of unspecified bronchus or lung: Secondary | ICD-10-CM

## 2012-07-19 DIAGNOSIS — Z9221 Personal history of antineoplastic chemotherapy: Secondary | ICD-10-CM | POA: Insufficient documentation

## 2012-07-19 DIAGNOSIS — Z7982 Long term (current) use of aspirin: Secondary | ICD-10-CM | POA: Insufficient documentation

## 2012-07-19 DIAGNOSIS — Z85828 Personal history of other malignant neoplasm of skin: Secondary | ICD-10-CM | POA: Insufficient documentation

## 2012-07-19 DIAGNOSIS — Z85830 Personal history of malignant neoplasm of bone: Secondary | ICD-10-CM | POA: Insufficient documentation

## 2012-07-19 DIAGNOSIS — R011 Cardiac murmur, unspecified: Secondary | ICD-10-CM | POA: Insufficient documentation

## 2012-07-19 DIAGNOSIS — I10 Essential (primary) hypertension: Secondary | ICD-10-CM | POA: Insufficient documentation

## 2012-07-19 DIAGNOSIS — Z8739 Personal history of other diseases of the musculoskeletal system and connective tissue: Secondary | ICD-10-CM | POA: Insufficient documentation

## 2012-07-19 DIAGNOSIS — J4489 Other specified chronic obstructive pulmonary disease: Secondary | ICD-10-CM | POA: Insufficient documentation

## 2012-07-19 LAB — CBC WITH DIFFERENTIAL/PLATELET
Basophils Absolute: 0 10*3/uL (ref 0.0–0.1)
Basophils Relative: 0 % (ref 0–1)
HCT: 41.5 % (ref 36.0–46.0)
Lymphocytes Relative: 2 % — ABNORMAL LOW (ref 12–46)
MCHC: 32.5 g/dL (ref 30.0–36.0)
Monocytes Absolute: 0.2 10*3/uL (ref 0.1–1.0)
Neutro Abs: 8.5 10*3/uL — ABNORMAL HIGH (ref 1.7–7.7)
Platelets: 142 10*3/uL — ABNORMAL LOW (ref 150–400)
RDW: 14.1 % (ref 11.5–15.5)
WBC: 8.9 10*3/uL (ref 4.0–10.5)

## 2012-07-19 LAB — POCT I-STAT TROPONIN I

## 2012-07-19 LAB — PRO B NATRIURETIC PEPTIDE: Pro B Natriuretic peptide (BNP): 6812 pg/mL — ABNORMAL HIGH (ref 0–125)

## 2012-07-19 MED ORDER — SODIUM CHLORIDE 0.9 % IJ SOLN
10.0000 mL | INTRAMUSCULAR | Status: DC | PRN
  Administered 2012-07-20: 10 mL

## 2012-07-19 MED ORDER — SODIUM CHLORIDE 0.9 % IV BOLUS (SEPSIS)
250.0000 mL | Freq: Once | INTRAVENOUS | Status: AC
  Administered 2012-07-19: 250 mL via INTRAVENOUS

## 2012-07-19 NOTE — ED Notes (Signed)
Critical Labs reported to Dr.Pollina

## 2012-07-19 NOTE — ED Provider Notes (Signed)
History     CSN: [id number]  Arrival date & time 07/19/12  2233   First MD Initiated Contact with Patient 07/19/12 2237      Chief Complaint  Patient presents with  . Altered Mental Status    (Consider location/radiation/quality/duration/timing/severity/associated sxs/prior treatment) HPI Comments: Patient brought to the ER for evaluation of mental status changes and difficulty breathing. Patient is a hospice patient for lung cancer. Family (sister) became concerned that she was exhibiting decreased mentation and increased work of breathing and asked that she be brought to the emergency department. Sister confirms that the patient is a DO NOT RESUSCITATE, comfort measures only. She does, however, stated that she would like for diagnostic testing and imaging be performed.  Patient is a 72 y.o. female presenting with altered mental status.  Altered Mental Status    Past Medical History  Diagnosis Date  . Hypertension   . Heart murmur   . Hip fracture, left   . COPD (chronic obstructive pulmonary disease)   . Skin cancer   . Skin cancer   . 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 Scoliosis 11/01/11    l3  . History of chemotherapy     6 cycles carboplatin/paclitaxel,last dose 08/2006,again given 07/22/2010,alimta q 3weeks s/p 9 cycless  . History of radiation therapy 08/10/2007-09/07/2007    right paratracheal area/ Dr.Kinard, [place]  . History of radiation therapy 2007    bilateral sterotatic radiotherapy Dr.McMullen at [facility] completed 11/25/2005  . Anemia   . S/P radiation therapy 12/04/11 - 01/19/12    [facility] : Right Femur/3000 cGy/10 Fractions and Right Iliac Bone / 3500 cGy/14 Fractions  . Bilateral pulmonary embolism   . CVA (cerebral infarction) Documented 02/18/12    Lovenox  . SOB (shortness of breath) on exertion Documented 02/18/12  . Pain   . Lung cancer 09/18/2005    rul =[facility] dx  . Cancer 11/01/11     mets rightfemur//iliac 5.6cm,&left iliac    . History of radiation therapy 04/2012    bone    Past Surgical History  Procedure Date  . Portacath placement   . Hip surgery     LEFT HIP  . Femur im nail 11/13/2011    Procedure: INTRAMEDULLARY (IM) NAIL FEMORAL;  Surgeon: Senaida Lange, MD;  Location: [facility] OR;  Service: Orthopedics;  Laterality: Right;  IM NAILING OF RIGHT HIP    Family History  Problem Relation Age of Onset  . Breast cancer Paternal Grandmother   . Prostate cancer Maternal Uncle     brainand colon ca  . Heart attack Mother 28    History  Substance Use Topics  . Smoking status: Current Every Day Smoker -- 0.5 packs/day for 50 years    Types: Cigarettes  . Smokeless tobacco: Never Used  . Alcohol Use: No    OB History    Grav Para Term Preterm Abortions TAB SAB Ect Mult Living                  Review of Systems  Unable to perform ROS Psychiatric/Behavioral: Positive for altered mental status.    Allergies  Other  Home Medications   Current Outpatient Rx  Name  Route  Sig  Dispense  Refill  . ASPIRIN 81 MG PO TBEC   Oral   Take 1 tablet (81 mg total) by mouth daily.         Marland Kitchen BISOPROLOL FUMARATE 5 MG PO TABS  Oral   Take 2.5 mg by mouth daily.          Marland Kitchen DEXAMETHASONE 4 MG PO TABS   Oral   Take 1 tablet (4 mg total) by mouth every 8 (eight) hours.   90 tablet   0   . FUROSEMIDE 20 MG PO TABS   Oral   Take 1 tablet (20 mg total) by mouth 2 (two) times daily. For edema   30 tablet   0   . LIDOCAINE 5 % EX PTCH   Transdermal   Place 2 patches onto the skin daily. Remove & Discard patch within 12 hours or as directed by MD   60 patch   2   . LISINOPRIL 20 MG PO TABS   Oral   Take 20 mg by mouth daily.           Marland Kitchen LORAZEPAM 0.5 MG PO TABS   Oral   Take 1 tablet (0.5 mg total) by mouth every 6 (six) hours as needed for anxiety.   30 tablet   0   . NAPROXEN SODIUM 220 MG PO TABS   Oral   Take 220 mg by mouth 2 (two) times daily as needed. For pain         .  OXYCODONE HCL ER 20 MG PO T12A   Oral   Take 1 tablet (20 mg total) by mouth every 8 (eight) hours.   90 tablet   0   . OXYCODONE HCL 10 MG PO TABS   Oral   Take 1 tablet (10 mg total) by mouth every 3 (three) hours as needed.   60 tablet   0   . PEG 3350-KCL-NABCB-NACL-NASULF 236 G PO SOLR   Oral   Take 240 mLs by mouth once as needed. Constipation, can repeat in 1 hour if no response   4000 mL   0   . POTASSIUM CHLORIDE CRYS ER 20 MEQ PO TBCR   Oral   Take 20 mEq by mouth 2 (two) times daily.         Marland Kitchen PROCHLORPERAZINE MALEATE 10 MG PO TABS   Oral   Take 1 tablet (10 mg total) by mouth every 6 (six) hours as needed. For nausea   30 tablet   0   . SENNOSIDES-DOCUSATE SODIUM 8.6-50 MG PO TABS   Oral   Take 1 tablet by mouth 2 (two) times daily.   60 tablet   3   . TIOTROPIUM BROMIDE MONOHYDRATE 18 MCG IN CAPS   Inhalation   Place 18 mcg into inhaler and inhale daily.           There were no vitals taken for this visit.  Physical Exam  Constitutional: She appears distressed.  HENT:  Head: Normocephalic.  Eyes: Pupils are equal, round, and reactive to light. Right eye exhibits abnormal extraocular motion.       persistant rightward/upper gaze  Neck: Normal range of motion. Neck supple.  Cardiovascular: Normal rate, regular rhythm and normal heart sounds.   Pulmonary/Chest: She is in respiratory distress. She has rales.  Abdominal: She exhibits mass.       Large ventral hernia, reducible  Musculoskeletal: She exhibits no edema.  Neurological: She is alert.       Follow commands, right side only  Skin:       Pale, mottled, peripheral cyanosis    ED Course  Procedures (including critical care time)  Labs Reviewed  CBC WITH DIFFERENTIAL - Abnormal;  Notable for the following:    Platelets 142 (*)     Neutrophils Relative 95 (*)     Neutro Abs 8.5 (*)     Lymphocytes Relative 2 (*)     Lymphs Abs 0.2 (*)     Monocytes Relative 2 (*)     All other  components within normal limits  CG4 I-STAT (LACTIC ACID) - Abnormal; Notable for the following:    Lactic Acid, Venous 2.39 (*)     All other components within normal limits  POCT I-STAT TROPONIN I - Abnormal; Notable for the following:    Troponin i, poc 0.11 (*)     All other components within normal limits  COMPREHENSIVE METABOLIC PANEL  PRO B NATRIURETIC PEPTIDE   Dg Chest Portable 1 View  07/19/2012  *RADIOLOGY REPORT*  Clinical Data: Stroke symptoms.  Shortness of breath.  PORTABLE CHEST - 1 VIEW  Comparison: 05/26/2012  Findings: Cardiac enlargement with normal pulmonary vascularity. Chronic nodular interstitial process in the lungs.  Linear atelectasis or fibrosis in the left mid lung and right mid lung and base.  Specific nodules seen previously are less well delineated today.  Diffuse emphysematous changes.  Power port type central venous catheter with tip over the cavoatrial junction.  No change in position.  No pneumothorax.  No focal consolidation.  No blunting of costophrenic angles.  Calcified and tortuous aorta. The  IMPRESSION: Chronic emphysematous and fibrotic changes in the lungs with diffuse nodular parenchymal pattern although less well-defined than on previous study.  Changes consistent with known metastatic disease.  No focal consolidation.   Original Report Authenticated By: Burman Nieves, M.D.      Diagnosis: Metastatic Lung CA    MDM  Patient presents to the ER for evaluation of difficulty breathing and mental status changes. Patient is currently a hospice patient for end-stage lung cancer. She is a DO NOT RESUSCITATE. Hospice also has provided paperwork stating that she does not want most interventions including IV fluids. I spoke with the sister, however and she did endorse feeling labs and x-rays. Patient did have some focal neurologic findings on examination. She did follow commands with her right arm and leg but did not seem to be moving her left side at all.  She also has persistent rightward upward days. This is concerning for CVA, possibly hemorrhagic. A CT scan was performed, however, and wishes chronic findings. Patient's labs are also fairly unremarkable. Her only significant abnormality was elevated troponin. Patient is tachycardic, possibly atrial fibrillation atrial flutter. I do not feel that the patient requires treatment for this and probably would not accept any way. I did discuss this with the on-call hospitalist and he recommended that the patient be sent home as the patient really has hospice care. When I talked to the family about this they were in agreement. She actually is scheduled to go to inpatient hospice for a week tomorrow to give the family a rest. She'll be given morphine here tonight and discharged by ambulance back to the home.        Gilda Crease, MD 08/06/2012 [phone]

## 2012-07-19 NOTE — ED Notes (Signed)
Patient with stroke like symptoms since noon on Friday 07/16/12.  Patient with DNR and MOST forms with patient.  Patient with history of Lung CA, bone CA.  Upon arrival to ED, patient with gaze toward ceiling,  No nystagmus, responds to commands and painful stimuli.

## 2012-07-20 LAB — COMPREHENSIVE METABOLIC PANEL
ALT: 58 U/L — ABNORMAL HIGH (ref 0–35)
AST: 44 U/L — ABNORMAL HIGH (ref 0–37)
Albumin: 2.8 g/dL — ABNORMAL LOW (ref 3.5–5.2)
Alkaline Phosphatase: 144 U/L — ABNORMAL HIGH (ref 39–117)
Calcium: 10 mg/dL (ref 8.4–10.5)
Chloride: 96 mEq/L (ref 96–112)
Creatinine, Ser: 0.52 mg/dL (ref 0.50–1.10)
GFR calc Af Amer: >90 mL/min (ref >90)
GFR calc non Af Amer: >90 mL/min (ref >90)
Sodium: 142 mEq/L (ref 135–145)
Total Bilirubin: 1.1 mg/dL (ref 0.3–1.2)

## 2012-07-20 MED ORDER — MORPHINE SULFATE 4 MG/ML IJ SOLN
4.0000 mg | Freq: Once | INTRAMUSCULAR | Status: AC
  Administered 2012-07-20: 4 mg via INTRAVENOUS
  Filled 2012-07-20: qty 1

## 2012-07-20 MED ORDER — HEPARIN SOD (PORK) LOCK FLUSH 100 UNIT/ML IV SOLN
500.0000 [IU] | INTRAVENOUS | Status: AC | PRN
  Administered 2012-07-20: 500 [IU]

## 2012-07-20 MED ORDER — ONDANSETRON HCL 4 MG/2ML IJ SOLN
4.0000 mg | Freq: Once | INTRAMUSCULAR | Status: AC
  Administered 2012-07-20: 4 mg via INTRAVENOUS
  Filled 2012-07-20: qty 2

## 2012-07-24 ENCOUNTER — Other Ambulatory Visit: Payer: Self-pay | Admitting: Physician Assistant

## 2012-08-07 NOTE — Progress Notes (Signed)
Chaplain Note:  Chaplain visited with pt and pt's family. Pt was in trauma bay being treated by [facility] staff.  Pt was unresponsive and did not communicate during this visit.  Pt's family was at bedside.  Chaplain provided spiritual comfort support and prayer for pt, remaining with family until pt was discharged.  Pt's family expressed appreciation for chaplain support.  07-31-2012 0200  Clinical Encounter Type  Visited With Patient and family together  Visit Type Spiritual support  Referral From Nurse  Spiritual Encounters  Spiritual Needs Emotional;Prayer  Stress Factors  Patient Stress Factors Major life changes;Health changes  Family Stress Factors Major life changes   Verdie Shire, [place] [phone]

## 2012-08-07 NOTE — ED Notes (Signed)
PT. TRANSPORTED HOME BY [facility]AR, SHALLOW /SLOW BREATHING .

## 2012-08-07 DEATH — deceased

## 2013-01-02 IMAGING — CR DG CHEST 2V
2 series · 2 of 2 positions shown · non-contrast
Comparison: 04/09/2012, 05/05/2012

CLINICAL DATA: Cough, congestion, lung cancer

CHEST - 2 VIEW

[x chest ap]
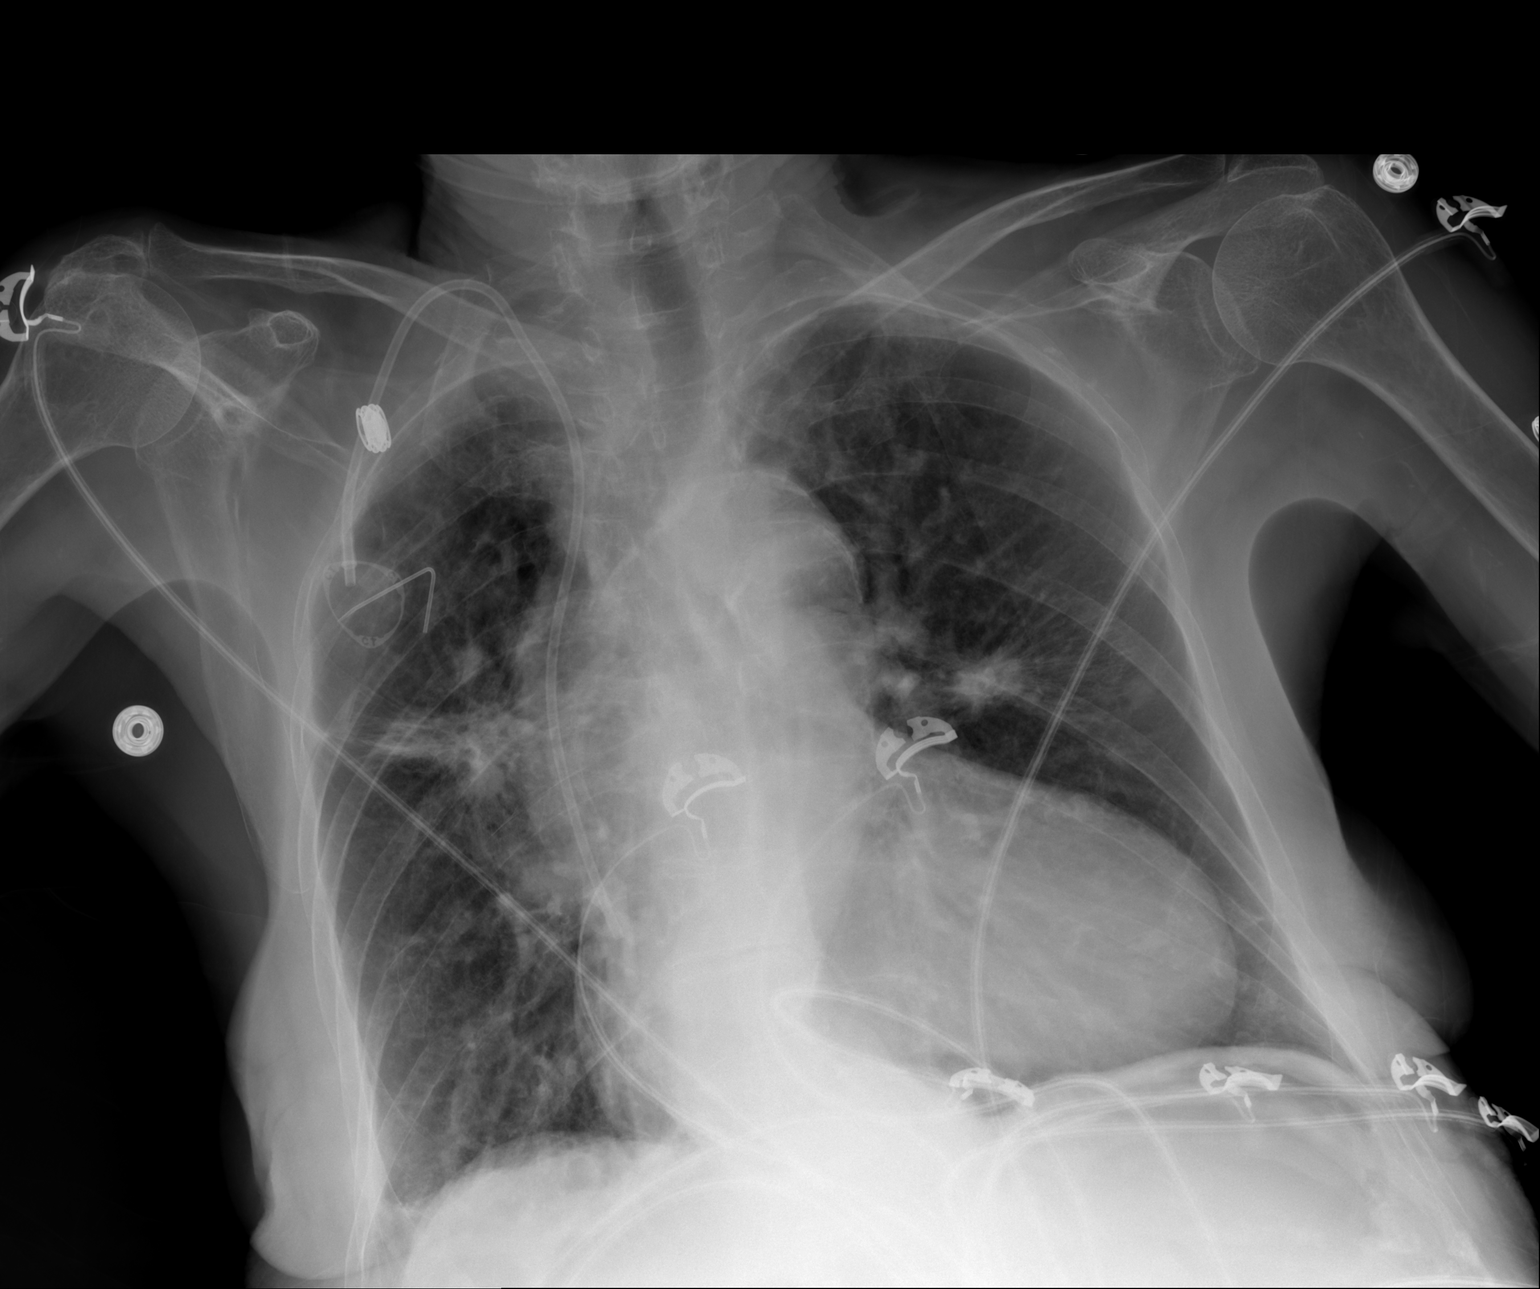

[w chest lat]
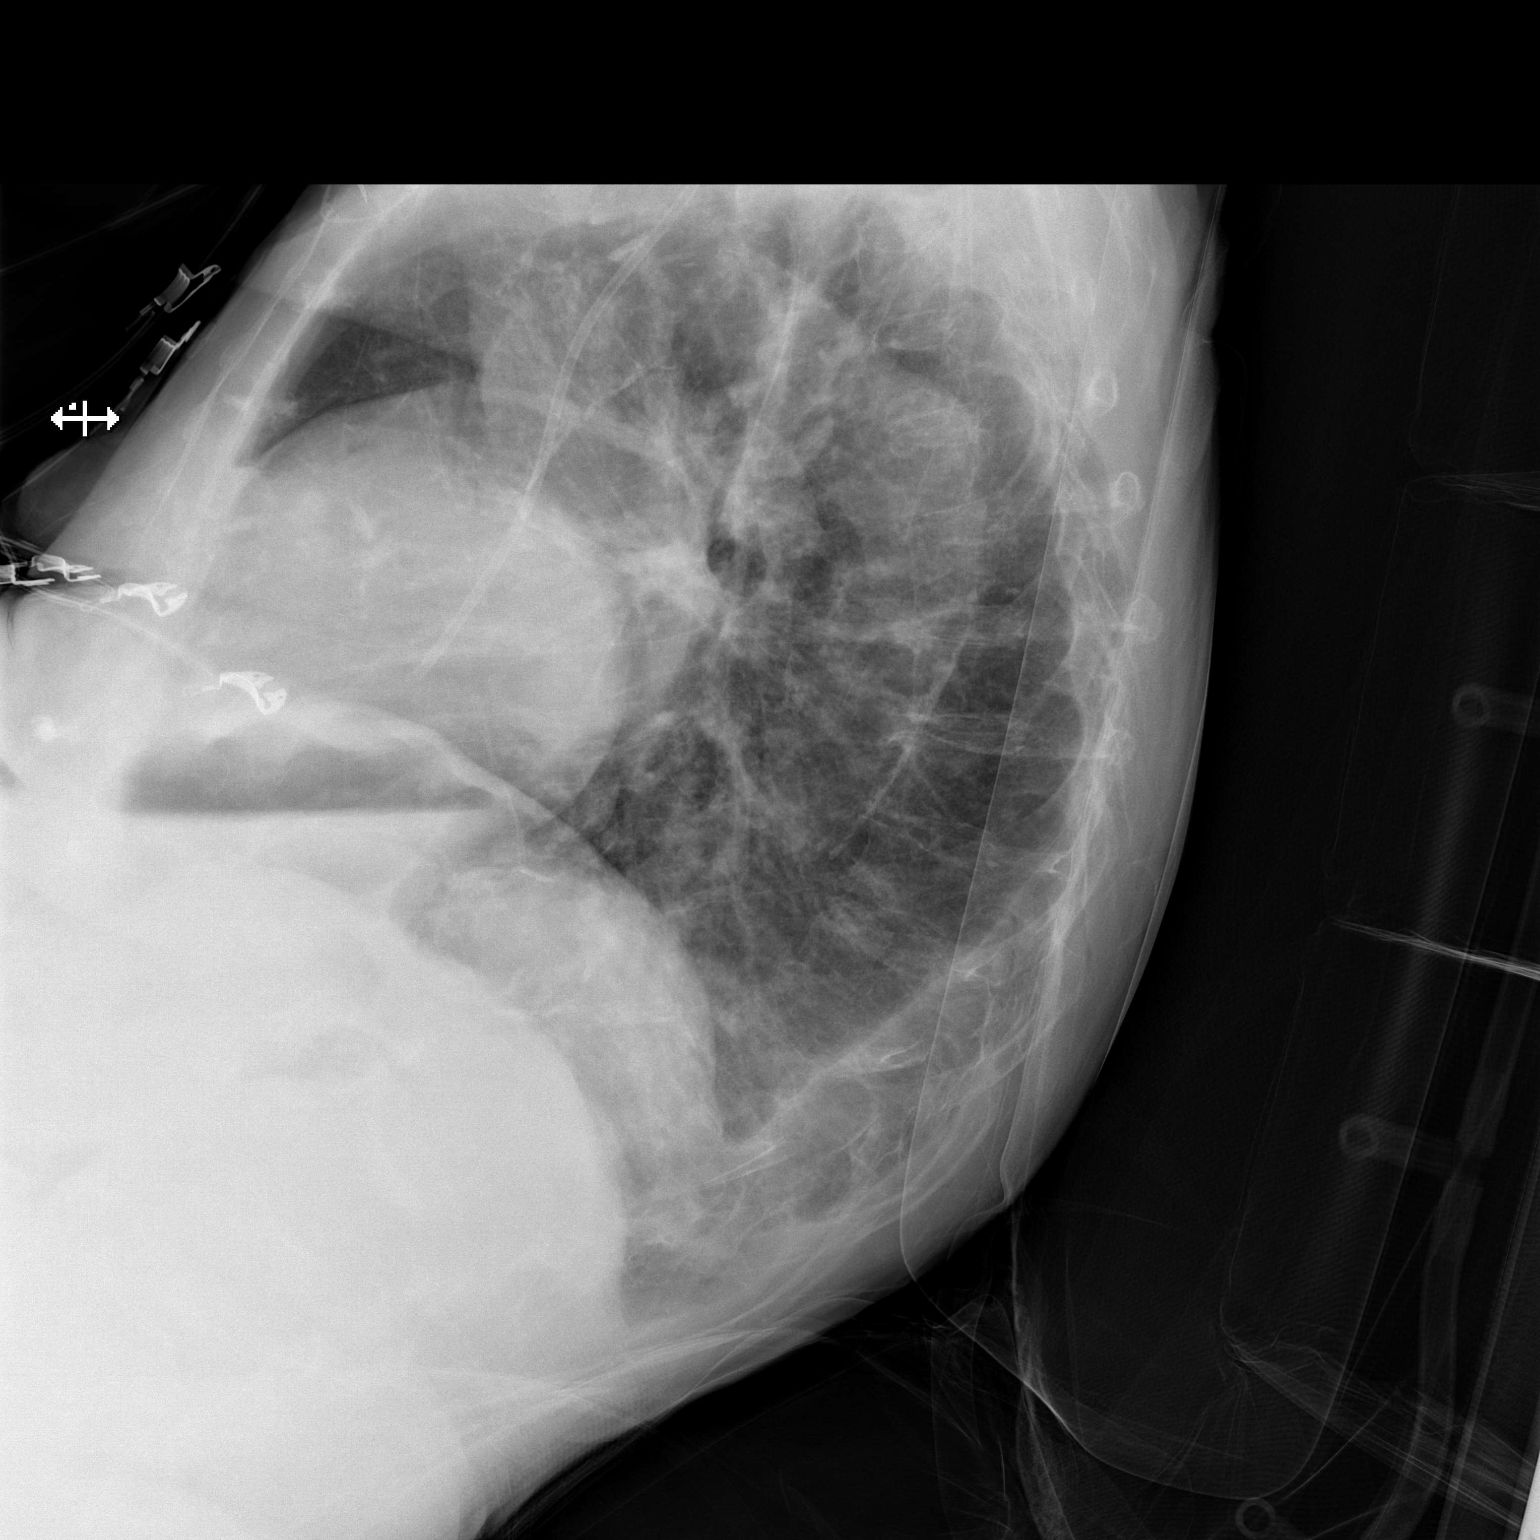

[2 of 2 positions shown; findings below may reference images not displayed]

FINDINGS: Right IJ power port catheter tip in the SVC.  Stable
heart size and vascularity.  No CHF.  Background emphysema noted.
Stable perihilar linear densities as before. Known pulmonary
nodules are better seen by CT.  No definite superimposed CHF,
effusion, definite pneumonia or pneumothorax.  Atherosclerosis of
the aorta.
IMPRESSION: Stable chronic changes as described.  No superimposed acute
process.

## 2013-01-02 IMAGING — CT CT CHEST W/O CM
1 of 2 series · 14 of 32 positions shown, 18 images · non-contrast
Comparison: 04/09/2012.

CLINICAL DATA: Shortness of breath.  History of lung cancer.

CT CHEST WITHOUT CONTRAST
TECHNIQUE: Multidetector CT imaging of the chest was performed
following the standard protocol without IV contrast.

[Series 2: chest w/o st · axial · non-contrast · 0.59mm/px · z∈[+140,+374]mm · 14 of 57 slices shown, 18 images]
[im 5/57  mediastinal]
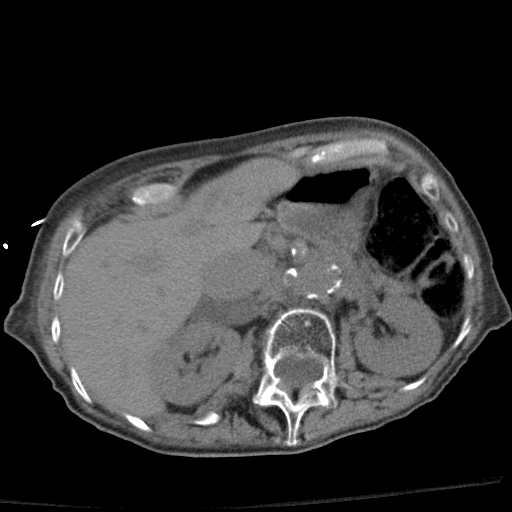
[im 5/57  lung]
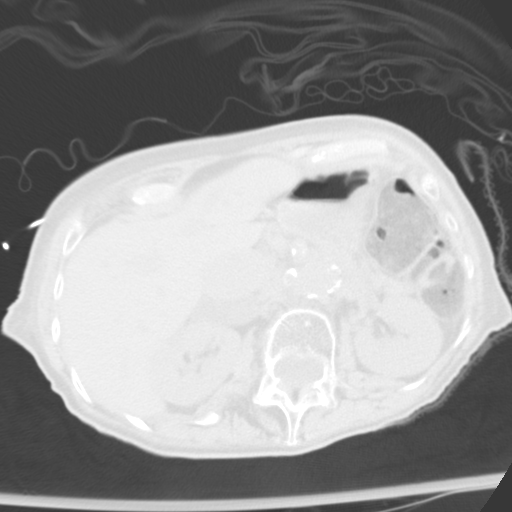
[im 9/57  lung]
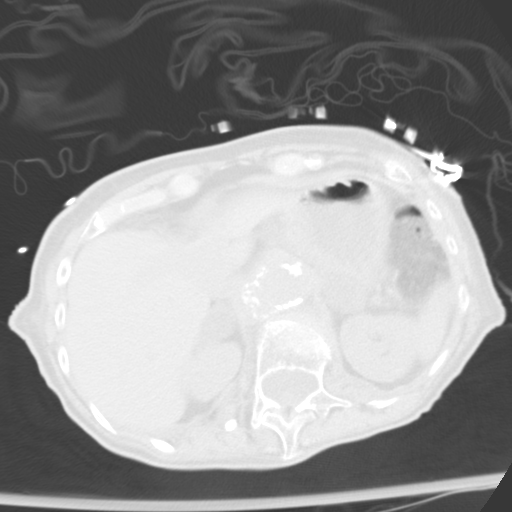
[im 13/57  lung]
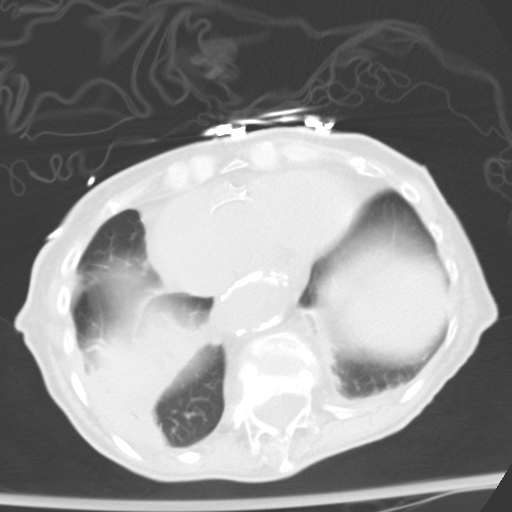
[im 18/57  lung]
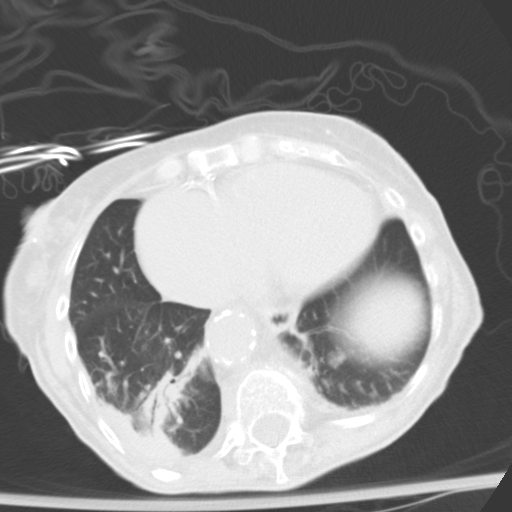
[im 22/57  mediastinal]
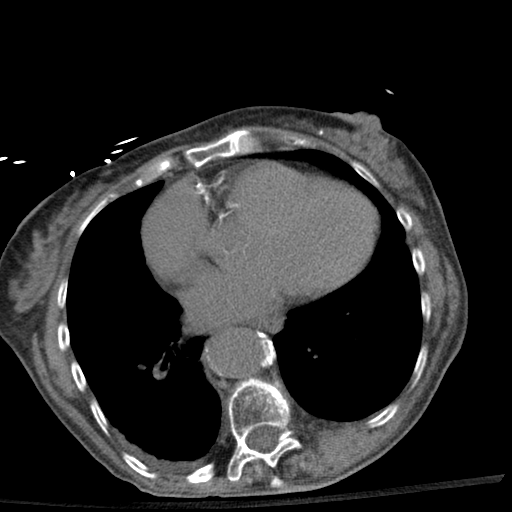
[im 22/57  lung]
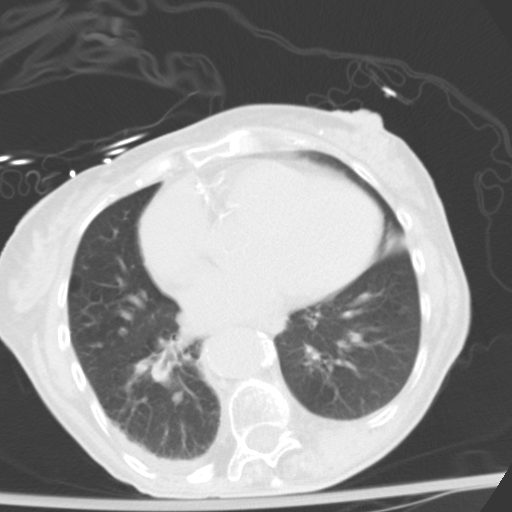
[im 26/57  lung]
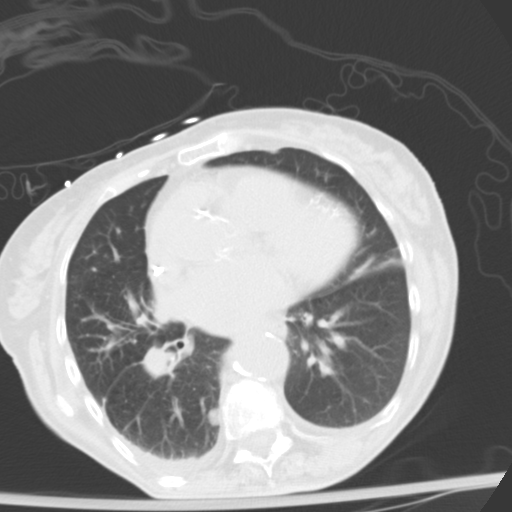
[im 27/57  lung]
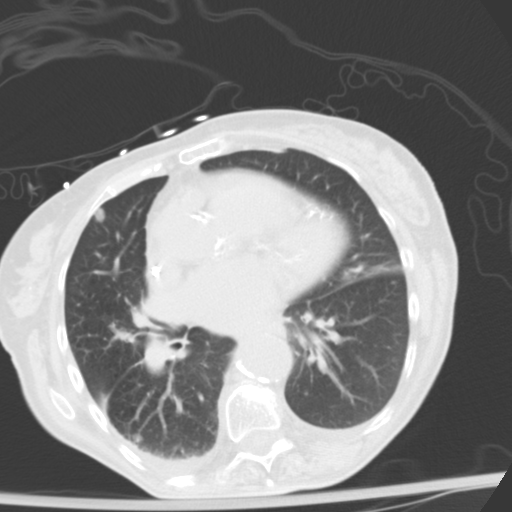
[im 29/57  lung]
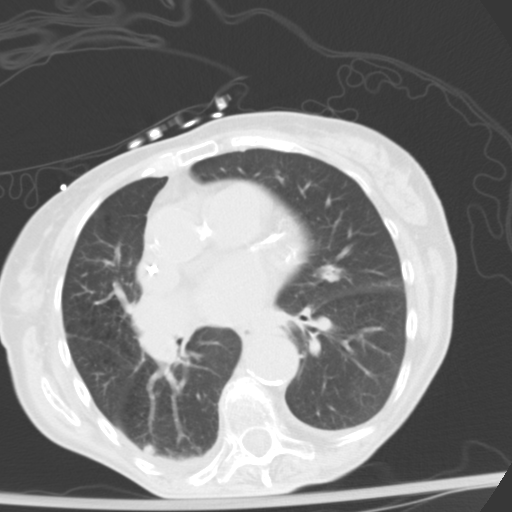
[im 31/57  mediastinal]
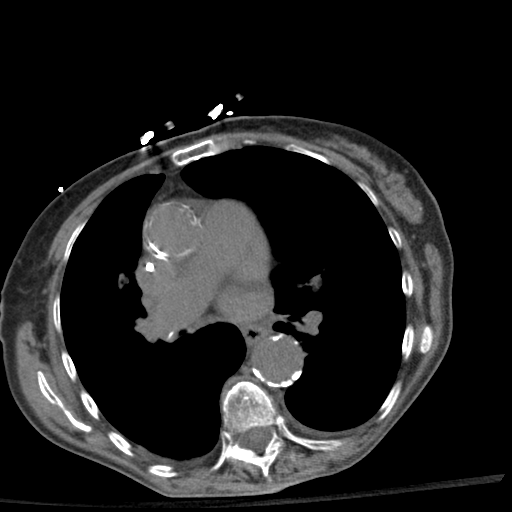
[im 31/57  lung]
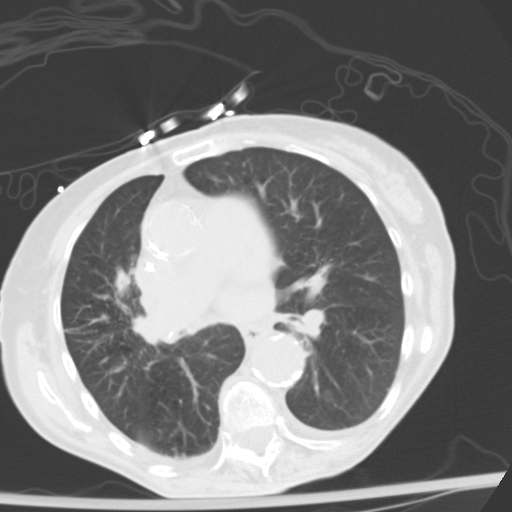
[im 35/57  lung]
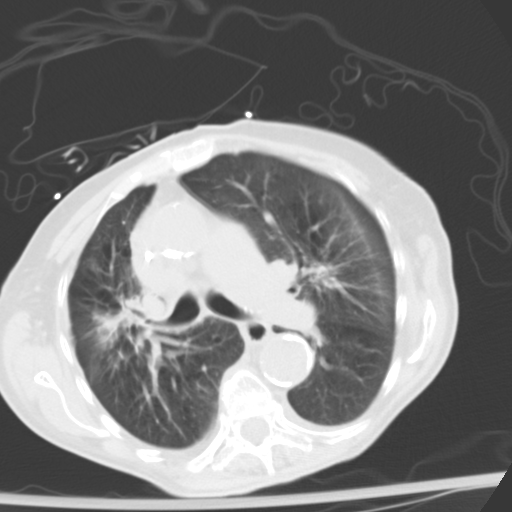
[im 39/57  lung]
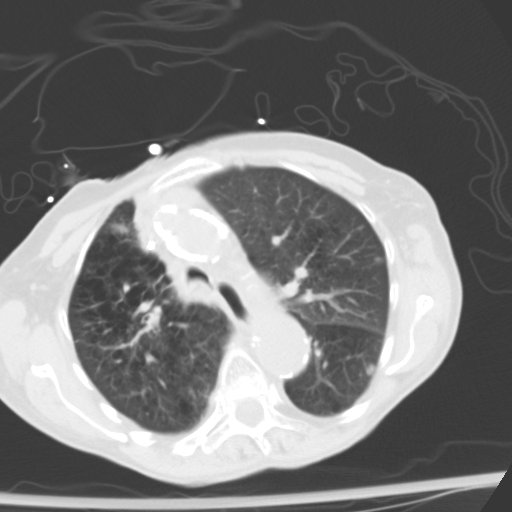
[im 44/57  lung]
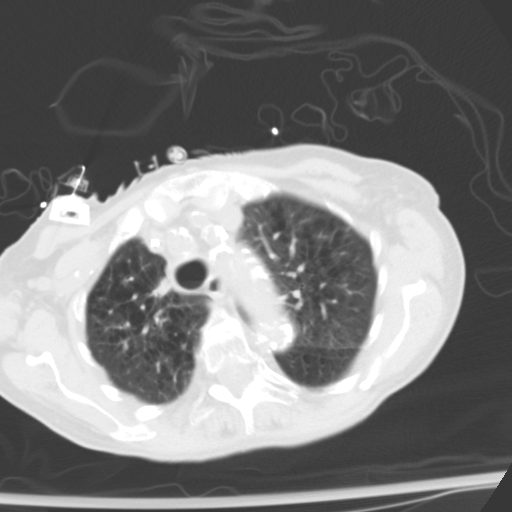
[im 48/57  mediastinal]
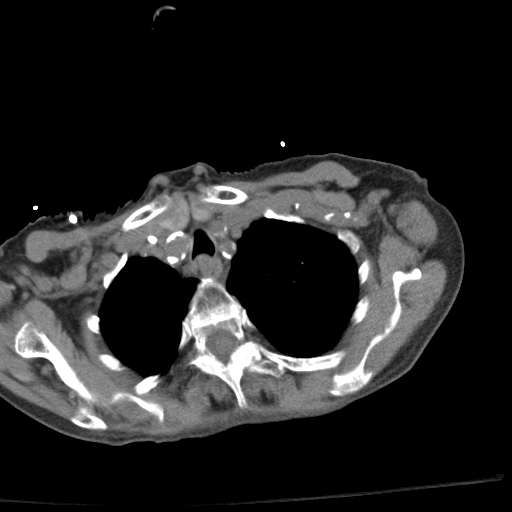
[im 48/57  lung]
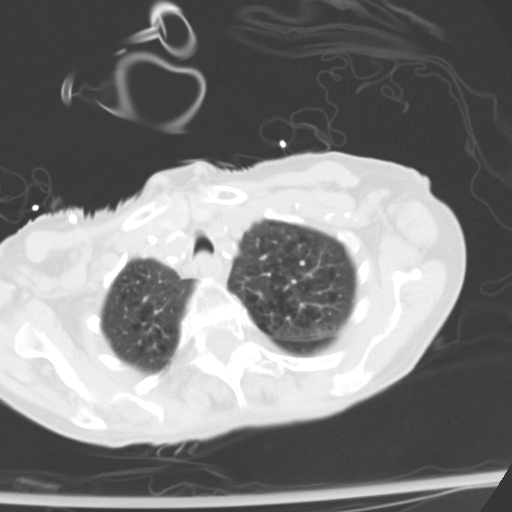
[im 52/57  lung]
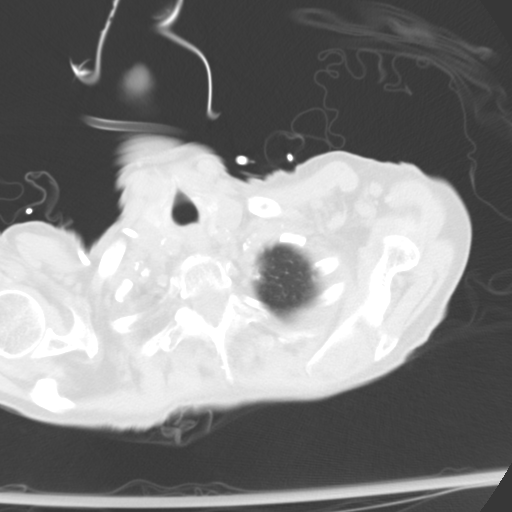

[14 of 32 positions shown; findings below may reference images not displayed]

FINDINGS: The chest wall is stable.  The right-sided Port-A-Cath is
noted.  No supraclavicular or axillary mass or adenopathy.  Small
scattered nodes are stable.  No definite breast masses.

Examination of the bony structures demonstrates a significant
osteoporosis.  There is a new compression fracture of the T12
vertebral body.  Mild retropulsion of the superior posterior aspect
but no significant canal compromise.  No sternal fracture or
obvious sternal metastasis.

The heart is mildly enlarged but stable.  No pericardial effusion.
There are stable mediastinal and hilar lymph nodes and stable
advanced atherosclerotic changes involving the aorta and branch
vessels.  Stable fusiform aneurysmal dilatation of the distal
descending thoracic aorta with maximal measurement of 3.3 cm.

Examination of the lung parenchyma demonstrates stable bilateral
hilar masses.  Severe underlying emphysematous changes with
numerous pulmonary metastasis.  The pulmonary lesions have enlarged
since prior CT scan.  A right middle lobe lesion on image number 30
measures 9 mm and previously measured 7.5 mm.  A right lower lobe
lesion adjacent to the spine on image number 34 measures 9.5 mm and
previously measured 8 mm.  A left lower lobe nodule on image number
30 measures 6 mm and previously measured 5 mm.

There is a area of airspace consolidation in the right lower lobe,
likely pneumonia with prominent air bronchograms.  Cannot exclude
endobronchial tumor.  No pleural effusion.

The upper abdomen is grossly stable.
IMPRESSION: 1.  Persistent perihilar mass lesions.
2.  Worsening pulmonary metastasis.
3.  Right lower lobe airspace consolidation / pneumonia with
possible endobronchial tumor.
4.  Stable advanced atherosclerotic changes.
5.  T12 compression fracture, likely pathologic.

## 2014-02-28 ENCOUNTER — Other Ambulatory Visit: Payer: Self-pay | Admitting: *Deleted

## 2014-03-02 NOTE — Telephone Encounter (Signed)
Encounter was telephone call.
